# Patient Record
Sex: Female | Born: 1975
Health system: Southern US, Community
[De-identification: ages and names within clinical notes are randomized; demographics above are authoritative.]

## PROBLEM LIST (undated history)

## (undated) DIAGNOSIS — Z9889 Other specified postprocedural states: Secondary | ICD-10-CM

## (undated) DIAGNOSIS — I10 Essential (primary) hypertension: Secondary | ICD-10-CM

## (undated) DIAGNOSIS — J45909 Unspecified asthma, uncomplicated: Secondary | ICD-10-CM

## (undated) DIAGNOSIS — D649 Anemia, unspecified: Secondary | ICD-10-CM

## (undated) DIAGNOSIS — J309 Allergic rhinitis, unspecified: Secondary | ICD-10-CM

## (undated) DIAGNOSIS — Z87442 Personal history of urinary calculi: Secondary | ICD-10-CM

## (undated) DIAGNOSIS — N200 Calculus of kidney: Secondary | ICD-10-CM

## (undated) DIAGNOSIS — K8020 Calculus of gallbladder without cholecystitis without obstruction: Secondary | ICD-10-CM

## (undated) DIAGNOSIS — E785 Hyperlipidemia, unspecified: Secondary | ICD-10-CM

## (undated) DIAGNOSIS — F411 Generalized anxiety disorder: Secondary | ICD-10-CM

## (undated) HISTORY — DX: Personal history of urinary calculi: Z87.442

## (undated) HISTORY — DX: Essential (primary) hypertension: I10

## (undated) HISTORY — DX: Anemia, unspecified: D64.9

## (undated) HISTORY — PX: OTHER SURGICAL HISTORY: SHX169

## (undated) HISTORY — DX: Unspecified asthma, uncomplicated: J45.909

## (undated) HISTORY — DX: Generalized anxiety disorder: F41.1

## (undated) HISTORY — DX: Allergic rhinitis, unspecified: J30.9

## (undated) HISTORY — DX: Hyperlipidemia, unspecified: E78.5

## (undated) HISTORY — DX: Calculus of gallbladder without cholecystitis without obstruction: K80.20

## (undated) HISTORY — PX: KIDNEY STONE SURGERY: SHX686

## (undated) HISTORY — PX: ORIF CONGENITAL HIP DISLOCATION: SHX2117

---

## 1997-07-01 HISTORY — PX: CHOLECYSTECTOMY: SHX55

## 2008-05-31 ENCOUNTER — Ambulatory Visit: Payer: Self-pay | Admitting: Internal Medicine

## 2008-05-31 DIAGNOSIS — F419 Anxiety disorder, unspecified: Secondary | ICD-10-CM | POA: Insufficient documentation

## 2008-05-31 DIAGNOSIS — F411 Generalized anxiety disorder: Secondary | ICD-10-CM

## 2008-05-31 DIAGNOSIS — K8020 Calculus of gallbladder without cholecystitis without obstruction: Secondary | ICD-10-CM | POA: Insufficient documentation

## 2008-05-31 DIAGNOSIS — N200 Calculus of kidney: Secondary | ICD-10-CM | POA: Insufficient documentation

## 2008-05-31 DIAGNOSIS — J309 Allergic rhinitis, unspecified: Secondary | ICD-10-CM

## 2008-05-31 DIAGNOSIS — E785 Hyperlipidemia, unspecified: Secondary | ICD-10-CM

## 2008-05-31 DIAGNOSIS — J45909 Unspecified asthma, uncomplicated: Secondary | ICD-10-CM

## 2008-05-31 DIAGNOSIS — J4520 Mild intermittent asthma, uncomplicated: Secondary | ICD-10-CM | POA: Insufficient documentation

## 2008-05-31 DIAGNOSIS — Z87442 Personal history of urinary calculi: Secondary | ICD-10-CM

## 2008-05-31 HISTORY — DX: Calculus of gallbladder without cholecystitis without obstruction: K80.20

## 2008-05-31 HISTORY — DX: Personal history of urinary calculi: Z87.442

## 2008-05-31 HISTORY — DX: Unspecified asthma, uncomplicated: J45.909

## 2008-05-31 HISTORY — DX: Allergic rhinitis, unspecified: J30.9

## 2008-05-31 HISTORY — DX: Generalized anxiety disorder: F41.1

## 2008-05-31 HISTORY — DX: Hyperlipidemia, unspecified: E78.5

## 2008-11-29 ENCOUNTER — Ambulatory Visit: Payer: Self-pay | Admitting: Internal Medicine

## 2009-08-22 ENCOUNTER — Ambulatory Visit: Payer: Self-pay | Admitting: Internal Medicine

## 2009-11-23 ENCOUNTER — Ambulatory Visit: Payer: Self-pay | Admitting: Internal Medicine

## 2009-11-23 DIAGNOSIS — M25579 Pain in unspecified ankle and joints of unspecified foot: Secondary | ICD-10-CM | POA: Insufficient documentation

## 2009-12-04 ENCOUNTER — Other Ambulatory Visit: Admission: RE | Admit: 2009-12-04 | Discharge: 2009-12-04 | Payer: Self-pay | Admitting: Internal Medicine

## 2009-12-04 ENCOUNTER — Ambulatory Visit: Payer: Self-pay | Admitting: Internal Medicine

## 2009-12-08 LAB — CONVERTED CEMR LAB: Pap Smear: NEGATIVE

## 2010-01-08 ENCOUNTER — Ambulatory Visit: Payer: Self-pay | Admitting: Internal Medicine

## 2010-06-07 ENCOUNTER — Ambulatory Visit: Payer: Self-pay | Admitting: Internal Medicine

## 2010-06-07 DIAGNOSIS — L738 Other specified follicular disorders: Secondary | ICD-10-CM | POA: Insufficient documentation

## 2010-07-31 NOTE — Assessment & Plan Note (Signed)
Summary: 2ND HEP INJ//CCM  Nurse Visit   Allergies: 1)  ! Percocet (Oxycodone-Acetaminophen) 2)  ! Compazine  Immunizations Administered:  Hepatitis A Vaccine # 2:    Vaccine Type: HepA    Site: left deltoid    Mfr: GlaxoSmithKline    Dose: 1.0 ml    Route: IM    Given by: Duard Brady LPN    Exp. Date: 10/18/2011    Lot #: [id number]    VIS given: 09/18/04 version given January 08, 2010.    Physician counseled: yes  Orders Added: 1)  Hepatitis A Vaccine (Adult Dose) [90632] 2)  Admin 1st Vaccine [90471]   Pt is leaving the country for [facility] in 2 weeks. KIK

## 2010-07-31 NOTE — Assessment & Plan Note (Signed)
Summary: pt req hep A inj/dpt inj/cjr  Nurse Visit   Allergies: 1)  ! Percocet (Oxycodone-Acetaminophen) 2)  ! Compazine  Immunizations Administered:  Hepatitis A Vaccine # 1:    Vaccine Type: HepA    Site: right deltoid    Mfr: GlaxoSmithKline    Dose: 1.0 ml    Route: IM    Given by: Duard Brady LPN    Exp. Date: 10/18/2011    Lot #: [id number]  Orders Added: 1)  Hepatitis A Vaccine (Adult Dose) [90632] 2)  Admin 1st Vaccine [09811]  pt aware to make f/u appt for 6mos for 2nd injection. KIK

## 2010-07-31 NOTE — Assessment & Plan Note (Signed)
Summary: cpx/pap/njr   Vital Signs:  Patient profile:   35 year old female Height:      63 inches Weight:      178 pounds BMI:     31.65 Temp:     98.4 degrees F oral BP sitting:   110 / 80  (right arm) Cuff size:   regular  Vitals Entered By: Duard Brady LPN (December 04, 1608 3:00 PM) CC: cpx - needs pap Is Patient Diabetic? No   CC:  cpx - needs pap.  History of Present Illness: patient who is seen today for a health maintenance examination. She has a history of mild asthma which has been stable.  Only complaints are some difficult painful periods.  Discomfort is alleviated by Aleve.  Her asthma has been stable  Preventive Screening-Counseling & Management  Alcohol-Tobacco     Smoking Status: never  Allergies: 1)  ! Percocet (Oxycodone-Acetaminophen) 2)  ! Compazine  Past History:  Past Medical History: Reviewed history from 05/31/2008 and no changes required. Cholelithiasis status post cholecystectomy Nephrolithiasis, hx of history of congenital hip repair, 1979 gravida 3, para 3, abortus zero, status post C-section x 2 Allergic rhinitis Anxiety Asthma Hyperlipidemia  Past Surgical History: Reviewed history from 05/31/2008 and no changes required. Cholecystectomy 2000 surgery for a congenital hip page 6 1/2 months kidney stone extraction  Family History: Reviewed history from 05/31/2008 and no changes required. father age 12, history of elevated triglyceride level mother, age 80, history of breast cancer  Two half-brothers history depression  Social History: Reviewed history from 05/31/2008 and no changes required. Married Never Smoked 3 young children  Review of Systems  The patient denies anorexia, fever, weight loss, weight gain, vision loss, decreased hearing, hoarseness, chest pain, syncope, dyspnea on exertion, peripheral edema, prolonged cough, headaches, hemoptysis, abdominal pain, melena, hematochezia, severe indigestion/heartburn,  hematuria, incontinence, genital sores, muscle weakness, suspicious skin lesions, transient blindness, difficulty walking, depression, unusual weight change, abnormal bleeding, enlarged lymph nodes, angioedema, and breast masses.    Physical Exam  General:  overweight-appearing.  blood pressure, normaloverweight-appearing.   Head:  Normocephalic and atraumatic without obvious abnormalities. No apparent alopecia or balding. Eyes:  No corneal or conjunctival inflammation noted. EOMI. Perrla. Funduscopic exam benign, without hemorrhages, exudates or papilledema. Vision grossly normal. Ears:  External ear exam shows no significant lesions or deformities.  Otoscopic examination reveals clear canals, tympanic membranes are intact bilaterally without bulging, retraction, inflammation or discharge. Hearing is grossly normal bilaterally. Mouth:  Oral mucosa and oropharynx without lesions or exudates.  Teeth in good repair. Neck:  No deformities, masses, or tenderness noted. Chest Wall:  No deformities, masses, or tenderness noted. Breasts:  No mass, nodules, thickening, tenderness, bulging, retraction, inflamation, nipple discharge or skin changes noted.   Lungs:  Normal respiratory effort, chest expands symmetrically. Lungs are clear to auscultation, no crackles or wheezes. Heart:  Normal rate and regular rhythm. S1 and S2 normal without gallop, murmur, click, rub or other extra sounds. Abdomen:  Bowel sounds positive,abdomen soft and non-tender without masses, organomegaly or hernias noted. Rectal:  No external abnormalities noted. Normal sphincter tone. No rectal masses or tenderness. Genitalia:  Normal introitus for age, no external lesions, no vaginal discharge, mucosa pink and moist, no vaginal or cervical lesions, no vaginal atrophy, no friaility or hemorrhage, normal uterus size and position, no adnexal masses or tenderness Msk:  No deformity or scoliosis noted of thoracic or lumbar spine.   Pulses:   R and L carotid,radial,femoral,dorsalis  pedis and posterior tibial pulses are full and equal bilaterally Extremities:  No clubbing, cyanosis, edema, or deformity noted with normal full range of motion of all joints.   Neurologic:  alert & oriented X3, cranial nerves II-XII intact, gait normal, and DTRs symmetrical and normal.  alert & oriented X3, cranial nerves II-XII intact, gait normal, and DTRs symmetrical and normal.   Skin:  Intact without suspicious lesions or rashes Cervical Nodes:  No lymphadenopathy noted Axillary Nodes:  No palpable lymphadenopathy Psych:  Cognition and judgment appear intact. Alert and cooperative with normal attention span and concentration. No apparent delusions, illusions, hallucinations   Impression & Recommendations:  Problem # 1:  Preventive Health Care (ICD-V70.0)  Complete Medication List: 1)  Proventil Hfa 108 (90 Base) Mcg/act Aers (Albuterol sulfate) .... Prn  Other Orders: Prescription Created Electronically [phone])  Patient Instructions: 1)  Limit your Sodium (Salt) to less than 2 grams a day(slightly less than 1/2 a teaspoon) to prevent fluid retention, swelling, or worsening of symptoms. 2)  It is important that you exercise regularly at least 20 minutes 5 times a week. If you develop chest pain, have severe difficulty breathing, or feel very tired , stop exercising immediately and seek medical attention. 3)  You need to lose weight. Consider a lower calorie diet and regular exercise.  Prescriptions: PROVENTIL HFA 108 (90 BASE) MCG/ACT AERS (ALBUTEROL SULFATE) prn  #1 x 6   Entered and Authorized by:   Gordy Savers  MD   Signed by:   Gordy Savers  MD on 12/04/2009   Method used:   Electronically to        [facility]  [address] [phone]* (retail)       [address]       [place], [place]  [address]       Ph: [phone] or [phone]       Fax: [phone]   RxID:   [id number]

## 2010-07-31 NOTE — Assessment & Plan Note (Signed)
Summary: consult re: scratch on leg/?inj/cjr   Vital Signs:  Patient profile:   35 year old female Weight:      183 pounds Temp:     98.3 degrees F oral BP sitting:   130 / 78  (right arm) Cuff size:   regular  Vitals Entered By: Duard Brady LPN (June 07, 2010 12:45 PM) CC: (R) inner thigh ??infection Is Patient Diabetic? No   CC:  (R) inner thigh ??infection.  History of Present Illness: 35 year old patient who was seen at an urgent care 5 days ago and diagnosed with a strep pharyngitis.  She has been treated with Pen-Vee K.  She had an area of folliculitis involving the right upper inner thigh and was concerned about a significant skin and soft tissue infection..  She does state that the infection has improved over the past two days.  There's been no fever or chills.  He has a history of asthma, which has been stable  Allergies: 1)  ! Percocet (Oxycodone-Acetaminophen) 2)  ! Compazine  Review of Systems       The patient complains of suspicious skin lesions.  The patient denies anorexia, fever, weight loss, weight gain, vision loss, decreased hearing, hoarseness, chest pain, syncope, dyspnea on exertion, peripheral edema, prolonged cough, headaches, hemoptysis, abdominal pain, melena, hematochezia, severe indigestion/heartburn, hematuria, incontinence, genital sores, muscle weakness, transient blindness, difficulty walking, depression, unusual weight change, abnormal bleeding, enlarged lymph nodes, angioedema, and breast masses.    Physical Exam  General:  overweight-appearing.  distress afebrileoverweight-appearing.   Skin:  a small scaly, inflammatory nodule, approximately 4 to 5 mm present involving the right mid thigh region.  There is a slight surrounding area of induration, but no significant pain or induration.  This appeared to be improving   Impression & Recommendations:  Problem # 1:  FOLLICULITIS (ICD-704.8)  Problem # 2:  ASTHMA (ICD-493.90)  Her  updated medication list for this problem includes:    Proventil Hfa 108 (90 Base) Mcg/act Aers (Albuterol sulfate) .Marland Kitchen... Prn  Her updated medication list for this problem includes:    Proventil Hfa 108 (90 Base) Mcg/act Aers (Albuterol sulfate) .Marland Kitchen... Prn  Complete Medication List: 1)  Proventil Hfa 108 (90 Base) Mcg/act Aers (Albuterol sulfate) .... Prn 2)  Cephalexin 500 Mg Caps (Cephalexin) .... One three times a day  Patient Instructions: 1)  Please schedule a follow-up appointment as needed. Prescriptions: CEPHALEXIN 500 MG CAPS (CEPHALEXIN) one three times a day  #30and x 0   Entered and Authorized by:   Gordy Savers  MD   Signed by:   Gordy Savers  MD on 06/07/2010   Method used:   Print then Give to Patient   RxID:   [id number]    Orders Added: 1)  Est. Patient Level III [14782]

## 2010-07-31 NOTE — Assessment & Plan Note (Signed)
Summary: ankle discomfort/njr   Vital Signs:  Patient profile:   35 year old female Weight:      177 pounds Temp:     98.4 degrees F oral BP sitting:   126 / 70  (right arm)  Vitals Entered By: Duard Brady LPN (Nov 23, 2009 11:30 AM) CC: c/o (R) leg and ankle pain - twisted on sunday Is Patient Diabetic? No   CC:  c/o (R) leg and ankle pain - twisted on sunday.  History of Present Illness:  35 year old patient who fell down stairs at home 4 days ago.  She sustained trauma at that time, primarily to her right shoulder and right lower leg.  The shoulder pain  has largely resolved which is concerned about persistent swelling about her right ankle.  There is  very little right ankle discomfort.  She did sustain some soft tissue trauma to her right lower leg, above the ankle. patient has a history of asthma, which has been stable  Preventive Screening-Counseling & Management  Alcohol-Tobacco     Smoking Status: never  Allergies: 1)  ! Percocet (Oxycodone-Acetaminophen) 2)  ! Compazine  Physical Exam  General:  Well-developed,well-nourished,in no acute distress; alert,appropriate and cooperative throughout examination Msk:  resolving contusion over the right shoulder and right lower leg.  There was a mild edema about the right ankle with very little discomfort and no ecchymoses   Impression & Recommendations:  Problem # 1:  ASTHMA (ICD-493.90)  Problem # 2:  ANKLE PAIN, RIGHT (ICD-719.47)  Patient Instructions: 1)  attempt to keep your  right leg elevated as much as possible 2)  Take 400-600mg  of Ibuprofen (Advil, Motrin) with food every 4-6 hours as needed for relief of pain or comfort of fever.

## 2010-09-04 ENCOUNTER — Encounter: Payer: Self-pay | Admitting: Internal Medicine

## 2010-09-05 ENCOUNTER — Ambulatory Visit (INDEPENDENT_AMBULATORY_CARE_PROVIDER_SITE_OTHER): Payer: BC Managed Care – PPO | Admitting: Internal Medicine

## 2010-09-05 ENCOUNTER — Encounter: Payer: Self-pay | Admitting: Internal Medicine

## 2010-09-05 VITALS — BP 130/90 | Temp 98.0°F | Wt 180.0 lb

## 2010-09-05 DIAGNOSIS — G4700 Insomnia, unspecified: Secondary | ICD-10-CM

## 2010-09-05 DIAGNOSIS — R109 Unspecified abdominal pain: Secondary | ICD-10-CM

## 2010-09-05 LAB — POCT URINALYSIS DIPSTICK
Bilirubin, UA: NEGATIVE
Blood, UA: NEGATIVE
Glucose, UA: NEGATIVE
Ketones, UA: NEGATIVE
Leukocytes, UA: NEGATIVE
Nitrite, UA: NEGATIVE
pH, UA: 7.5

## 2010-09-05 MED ORDER — ZOLPIDEM TARTRATE 10 MG PO TABS: 10.0000 mg | ORAL_TABLET | Freq: Every evening | ORAL | Status: AC | PRN

## 2010-09-05 NOTE — Progress Notes (Signed)
  Subjective:    Patient ID: Angela Harrell, female    DOB: 12-19-1975, 35 y.o.   MRN: [id number]  HPI  35 year old patient who presents with a chief complaint of insomnia. She has had significant situational stressors and has 3 children at home one of which is still preschool. She has had some insomnia issues as well as episodic diarrhea. She feels that she might benefit talking with a Veterinary surgeon.   Review of Systems  Constitutional: Negative.   HENT: Negative for hearing loss, congestion, sore throat, rhinorrhea, dental problem, sinus pressure and tinnitus.   Eyes: Negative for pain, discharge and visual disturbance.  Respiratory: Negative for cough and shortness of breath.   Cardiovascular: Negative for chest pain, palpitations and leg swelling.  Gastrointestinal: Negative for nausea, vomiting, abdominal pain, diarrhea, constipation, blood in stool and abdominal distention.  Genitourinary: Negative for dysuria, urgency, frequency, hematuria, flank pain, vaginal bleeding, vaginal discharge, difficulty urinating, vaginal pain and pelvic pain.  Musculoskeletal: Negative for joint swelling, arthralgias and gait problem.  Skin: Negative for rash.  Neurological: Negative for dizziness, syncope, speech difficulty, weakness, numbness and headaches.  Hematological: Negative for adenopathy.  Psychiatric/Behavioral: Positive for sleep disturbance and dysphoric mood. Negative for behavioral problems and agitation. The patient is not nervous/anxious.        Objective:   Physical Exam  Constitutional: She is oriented to person, place, and time. She appears well-developed and well-nourished.  HENT:  Head: Normocephalic.  Right Ear: External ear normal.  Left Ear: External ear normal.  Mouth/Throat: Oropharynx is clear and moist.  Eyes: Conjunctivae and EOM are normal. Pupils are equal, round, and reactive to light.  Neck: Normal range of motion. Neck supple. No thyromegaly present.  Cardiovascular:  Normal rate, regular rhythm, normal heart sounds and intact distal pulses.   Pulmonary/Chest: Effort normal and breath sounds normal.  Abdominal: Soft. Bowel sounds are normal. She exhibits no mass. There is no tenderness.  Musculoskeletal: Normal range of motion.  Lymphadenopathy:    She has no cervical adenopathy.  Neurological: She is alert and oriented to person, place, and time.  Skin: Skin is warm and dry. No rash noted.  Psychiatric: She has a normal mood and affect. Her behavior is normal.        Anxious and at times tearful          Assessment & Plan:   situational stress anxiety insomnia   We'll dispense a prescription for Ambien to take when necessary. We'll set her up for behavioral health

## 2010-09-05 NOTE — Patient Instructions (Signed)
It is important that you exercise regularly, at least 20 minutes 3 to 4 times per week.  If you develop chest pain or shortness of breath seek  medical attention.   behavioral health referral as discussed  Call or return to clinic prn if these symptoms worsen or fail to improve as anticipated.

## 2010-10-17 ENCOUNTER — Ambulatory Visit (INDEPENDENT_AMBULATORY_CARE_PROVIDER_SITE_OTHER): Payer: BC Managed Care – PPO | Admitting: Licensed Clinical Social Worker

## 2010-10-17 DIAGNOSIS — F4322 Adjustment disorder with anxiety: Secondary | ICD-10-CM

## 2010-10-31 ENCOUNTER — Ambulatory Visit (INDEPENDENT_AMBULATORY_CARE_PROVIDER_SITE_OTHER): Payer: BC Managed Care – PPO | Admitting: Licensed Clinical Social Worker

## 2010-10-31 DIAGNOSIS — F4322 Adjustment disorder with anxiety: Secondary | ICD-10-CM

## 2010-11-19 ENCOUNTER — Ambulatory Visit (INDEPENDENT_AMBULATORY_CARE_PROVIDER_SITE_OTHER): Payer: BC Managed Care – PPO | Admitting: Licensed Clinical Social Worker

## 2010-11-19 DIAGNOSIS — F4322 Adjustment disorder with anxiety: Secondary | ICD-10-CM

## 2010-11-21 ENCOUNTER — Ambulatory Visit (INDEPENDENT_AMBULATORY_CARE_PROVIDER_SITE_OTHER): Payer: BC Managed Care – PPO | Admitting: Licensed Clinical Social Worker

## 2010-11-21 DIAGNOSIS — F4322 Adjustment disorder with anxiety: Secondary | ICD-10-CM

## 2010-12-07 ENCOUNTER — Telehealth: Payer: Self-pay | Admitting: *Deleted

## 2010-12-07 NOTE — Telephone Encounter (Signed)
VM pt reporting fever X 3 days, 100.0, 101.0, 102.0, started as a headache, body aches.  Cough began last night, denies abd pain, no nausea, vomiting, diarrhea.  Fever gets worse after she eats something...temp is 101 now.  Offered OV with Dr Rodena Medin, pt wanted Dr Charm Rings suggestion first.  Also gave phone number for Sat Clinic.

## 2010-12-07 NOTE — Telephone Encounter (Signed)
Pt informed

## 2010-12-07 NOTE — Telephone Encounter (Signed)
Suggest symptomatic treatment with Tylenol and use next DM. Okay for Saturday clinic in the morning if unimproved

## 2010-12-14 ENCOUNTER — Encounter: Payer: Self-pay | Admitting: Internal Medicine

## 2010-12-14 ENCOUNTER — Ambulatory Visit (INDEPENDENT_AMBULATORY_CARE_PROVIDER_SITE_OTHER): Payer: BC Managed Care – PPO | Admitting: Internal Medicine

## 2010-12-14 DIAGNOSIS — J45909 Unspecified asthma, uncomplicated: Secondary | ICD-10-CM

## 2010-12-14 DIAGNOSIS — D239 Other benign neoplasm of skin, unspecified: Secondary | ICD-10-CM

## 2010-12-14 DIAGNOSIS — D229 Melanocytic nevi, unspecified: Secondary | ICD-10-CM

## 2010-12-14 DIAGNOSIS — J069 Acute upper respiratory infection, unspecified: Secondary | ICD-10-CM

## 2010-12-14 NOTE — Progress Notes (Signed)
  Subjective:    Patient ID: Angela Harrell, female    DOB: 17-Jan-1976, 35 y.o.   MRN: [id number]  HPI 35 year old patient who has a history of asthma who presents with a one-week history of largely nonproductive cough. There's been no increase in wheezing. She had fever earlier that has resolved no chest pain or shortness of breath. Associated complaints include fatigue. 2 children at home have a similar illness   Review of Systems  Constitutional: Positive for fatigue.  HENT: Negative for hearing loss, congestion, sore throat, rhinorrhea, dental problem, sinus pressure and tinnitus.   Eyes: Negative for pain, discharge and visual disturbance.  Respiratory: Positive for cough. Negative for shortness of breath.   Cardiovascular: Negative for chest pain, palpitations and leg swelling.  Gastrointestinal: Negative for nausea, vomiting, abdominal pain, diarrhea, constipation, blood in stool and abdominal distention.  Genitourinary: Negative for dysuria, urgency, frequency, hematuria, flank pain, vaginal bleeding, vaginal discharge, difficulty urinating, vaginal pain and pelvic pain.  Musculoskeletal: Negative for joint swelling, arthralgias and gait problem.  Skin: Negative for rash.  Neurological: Negative for dizziness, syncope, speech difficulty, weakness, numbness and headaches.  Hematological: Negative for adenopathy.  Psychiatric/Behavioral: Negative for behavioral problems, dysphoric mood and agitation. The patient is not nervous/anxious.        Objective:   Physical Exam  Constitutional: She is oriented to person, place, and time. She appears well-developed and well-nourished.  HENT:  Head: Normocephalic.  Right Ear: External ear normal.  Left Ear: External ear normal.  Mouth/Throat: Oropharynx is clear and moist.  Eyes: Conjunctivae and EOM are normal. Pupils are equal, round, and reactive to light.  Neck: Normal range of motion. Neck supple. No thyromegaly present.  Cardiovascular:  Normal rate, regular rhythm, normal heart sounds and intact distal pulses.   Pulmonary/Chest: Effort normal and breath sounds normal.  Abdominal: Soft. Bowel sounds are normal. She exhibits no mass. There is no tenderness.  Musculoskeletal: Normal range of motion.  Lymphadenopathy:    She has no cervical adenopathy.  Neurological: She is alert and oriented to person, place, and time.  Skin: Skin is warm and dry. No rash noted.  Psychiatric: She has a normal mood and affect. Her behavior is normal.          Assessment & Plan:   Viral URI. Will treat symptomatically with Mucinex DM Asthma stable will continue Proventil when necessary

## 2010-12-14 NOTE — Patient Instructions (Signed)
Get plenty of rest, Drink lots of  clear liquids, and use Tylenol or ibuprofen for fever and discomfort.    Call or return to clinic prn if these symptoms worsen or fail to improve as anticipated.  

## 2010-12-20 ENCOUNTER — Ambulatory Visit (INDEPENDENT_AMBULATORY_CARE_PROVIDER_SITE_OTHER): Payer: BC Managed Care – PPO | Admitting: Licensed Clinical Social Worker

## 2010-12-20 DIAGNOSIS — F4322 Adjustment disorder with anxiety: Secondary | ICD-10-CM

## 2011-02-08 ENCOUNTER — Encounter: Payer: Self-pay | Admitting: Family Medicine

## 2011-02-08 ENCOUNTER — Ambulatory Visit (INDEPENDENT_AMBULATORY_CARE_PROVIDER_SITE_OTHER): Payer: BC Managed Care – PPO | Admitting: Family Medicine

## 2011-02-08 VITALS — BP 110/80 | Temp 98.2°F | Wt 172.0 lb

## 2011-02-08 DIAGNOSIS — L03119 Cellulitis of unspecified part of limb: Secondary | ICD-10-CM

## 2011-02-08 DIAGNOSIS — L02419 Cutaneous abscess of limb, unspecified: Secondary | ICD-10-CM

## 2011-02-08 MED ORDER — DOXYCYCLINE HYCLATE 100 MG PO TABS: 100.0000 mg | ORAL_TABLET | Freq: Two times a day (BID) | ORAL | Status: AC

## 2011-02-08 NOTE — Progress Notes (Signed)
  Subjective:    Patient ID: Angela Harrell, female    DOB: 24-Jun-1976, 35 y.o.   MRN: [id number]  HPI Rash left upper thigh. Onset yesterday. Minimally painful. No fever or chills. No history of MRSA or recurrent staph. Not aware of any bites. She denies any antibiotic allergies. No significant chronic medical problems. No exacerbating or alleviating factors.   Review of Systems  Constitutional: Negative for fever and chills.  Skin: Positive for rash.  Hematological: Negative for adenopathy.       Objective:   Physical Exam  Constitutional: She is oriented to person, place, and time. She appears well-developed and well-nourished.  Cardiovascular: Normal rate and regular rhythm.   Neurological: She is alert and oriented to person, place, and time.  Skin:       Oval shaped area of erythema left upper thigh anteriorly. Area is 1 x 2 cm. Central slightly crusted punctate area. No fluctuance. Minimally tender          Assessment & Plan:  Cellulitis left upper anterior thigh.  No abscess at this time.  Doxycycline 100 mg twice daily for 10 days. Follow up promptly for fever or worsening rash

## 2011-02-08 NOTE — Patient Instructions (Signed)
Continue warm compresses to L thigh several times daily Follow up promptly for any fever or worsening rash.

## 2012-02-19 ENCOUNTER — Other Ambulatory Visit: Payer: Self-pay

## 2012-10-02 ENCOUNTER — Encounter (HOSPITAL_COMMUNITY): Payer: Self-pay | Admitting: Emergency Medicine

## 2012-10-02 DIAGNOSIS — Z8709 Personal history of other diseases of the respiratory system: Secondary | ICD-10-CM | POA: Insufficient documentation

## 2012-10-02 DIAGNOSIS — Z79899 Other long term (current) drug therapy: Secondary | ICD-10-CM | POA: Insufficient documentation

## 2012-10-02 DIAGNOSIS — R319 Hematuria, unspecified: Secondary | ICD-10-CM | POA: Insufficient documentation

## 2012-10-02 DIAGNOSIS — Z862 Personal history of diseases of the blood and blood-forming organs and certain disorders involving the immune mechanism: Secondary | ICD-10-CM | POA: Insufficient documentation

## 2012-10-02 DIAGNOSIS — R300 Dysuria: Secondary | ICD-10-CM | POA: Insufficient documentation

## 2012-10-02 DIAGNOSIS — Z8719 Personal history of other diseases of the digestive system: Secondary | ICD-10-CM | POA: Insufficient documentation

## 2012-10-02 DIAGNOSIS — Z8659 Personal history of other mental and behavioral disorders: Secondary | ICD-10-CM | POA: Insufficient documentation

## 2012-10-02 DIAGNOSIS — R350 Frequency of micturition: Secondary | ICD-10-CM | POA: Insufficient documentation

## 2012-10-02 DIAGNOSIS — J45909 Unspecified asthma, uncomplicated: Secondary | ICD-10-CM | POA: Insufficient documentation

## 2012-10-02 DIAGNOSIS — N200 Calculus of kidney: Secondary | ICD-10-CM | POA: Insufficient documentation

## 2012-10-02 DIAGNOSIS — Z3202 Encounter for pregnancy test, result negative: Secondary | ICD-10-CM | POA: Insufficient documentation

## 2012-10-02 DIAGNOSIS — Z8639 Personal history of other endocrine, nutritional and metabolic disease: Secondary | ICD-10-CM | POA: Insufficient documentation

## 2012-10-02 NOTE — ED Notes (Signed)
C/o L sided abd pain and L flank pain since 1pm with mild nausea. Reports difficulty urinating today.

## 2012-10-03 ENCOUNTER — Emergency Department (HOSPITAL_COMMUNITY): Payer: BC Managed Care – PPO

## 2012-10-03 ENCOUNTER — Emergency Department (HOSPITAL_COMMUNITY)
Admission: EM | Admit: 2012-10-03 | Discharge: 2012-10-03 | Disposition: A | Discharge status: Home / Self Care | Payer: BC Managed Care – PPO | Attending: Emergency Medicine | Admitting: Emergency Medicine

## 2012-10-03 DIAGNOSIS — N200 Calculus of kidney: Secondary | ICD-10-CM

## 2012-10-03 HISTORY — DX: Calculus of kidney: N20.0

## 2012-10-03 LAB — CBC WITH DIFFERENTIAL/PLATELET
Basophils Relative: 0 % (ref 0–1)
Eosinophils Absolute: 0.6 10*3/uL (ref 0.0–0.7)
Eosinophils Relative: 6 % — ABNORMAL HIGH (ref 0–5)
HCT: 37 % (ref 36.0–46.0)
Hemoglobin: 12.8 g/dL (ref 12.0–15.0)
MCH: 29 pg (ref 26.0–34.0)
MCHC: 34.6 g/dL (ref 30.0–36.0)
MCV: 83.7 fL (ref 78.0–100.0)
Monocytes Absolute: 0.8 10*3/uL (ref 0.1–1.0)
Monocytes Relative: 7 % (ref 3–12)

## 2012-10-03 LAB — URINALYSIS, MICROSCOPIC ONLY
Glucose, UA: NEGATIVE mg/dL
Leukocytes, UA: NEGATIVE
Protein, ur: 30 mg/dL — AB
Urobilinogen, UA: 0.2 mg/dL (ref 0.0–1.0)

## 2012-10-03 LAB — COMPREHENSIVE METABOLIC PANEL
Albumin: 4 g/dL (ref 3.5–5.2)
BUN: 13 mg/dL (ref 6–23)
Chloride: 102 mEq/L (ref 96–112)
Creatinine, Ser: 0.85 mg/dL (ref 0.50–1.10)
Total Bilirubin: 0.2 mg/dL — ABNORMAL LOW (ref 0.3–1.2)

## 2012-10-03 MED ORDER — ONDANSETRON HCL 4 MG PO TABS: 4.0000 mg | ORAL_TABLET | Freq: Four times a day (QID) | ORAL | Status: DC

## 2012-10-03 MED ORDER — ONDANSETRON HCL 4 MG/2ML IJ SOLN
4.0000 mg | Freq: Once | INTRAMUSCULAR | Status: AC
  Administered 2012-10-03: 4 mg via INTRAVENOUS
  Filled 2012-10-03: qty 2

## 2012-10-03 MED ORDER — HYDROCODONE-ACETAMINOPHEN 5-325 MG PO TABS: 1.0000 | ORAL_TABLET | Freq: Four times a day (QID) | ORAL | Status: DC | PRN

## 2012-10-03 NOTE — ED Notes (Signed)
Dr. Norlene Campbell notified of pt.  No additional orders at this time.

## 2012-10-03 NOTE — ED Notes (Signed)
Patient transported to CT 

## 2012-10-03 NOTE — ED Provider Notes (Signed)
History     CSN: [id number]  Arrival date & time 10/02/12  2323   First MD Initiated Contact with Patient 10/03/12 [phone]      Chief Complaint  Patient presents with  . Flank Pain    (Consider location/radiation/quality/duration/timing/severity/associated sxs/prior treatment) Patient is a 37 y.o. female presenting with flank pain. The history is provided by the patient.  Flank Pain This is a new problem. The current episode started 3 to 5 hours ago. Episode frequency: intermittent today and severe about 1 hour before arrival. The problem has been gradually improving. Associated symptoms include abdominal pain. Associated symptoms comments: Left flank pain and suprapubic pain with dark urine and mild burning with urination.  Nausea without vomiting. Exacerbated by: urinating. Nothing relieves the symptoms. Treatments tried: nsaid. The treatment provided no relief.    Past Medical History  Diagnosis Date  . ALLERGIC RHINITIS 05/31/2008  . ANXIETY 05/31/2008  . ASTHMA 05/31/2008  . CHOLELITHIASIS 05/31/2008  . HYPERLIPIDEMIA 05/31/2008  . NEPHROLITHIASIS, HX OF 05/31/2008  . Kidney stone     Past Surgical History  Procedure Laterality Date  . Cholecystectomy    . Kidney stone surgery      No family history on file.  History  Substance Use Topics  . Smoking status: Never Smoker   . Smokeless tobacco: Not on file  . Alcohol Use: No    OB History   Grav Para Term Preterm Abortions TAB SAB Ect Mult Living                  Review of Systems  Constitutional: Negative for fever.  Gastrointestinal: Positive for abdominal pain.  Genitourinary: Positive for dysuria, frequency, hematuria and flank pain.  All other systems reviewed and are negative.    Allergies  Oxycodone-acetaminophen and Prochlorperazine edisylate  Home Medications   Current Outpatient Rx  Name  Route  Sig  Dispense  Refill  . albuterol (PROVENTIL HFA) 108 (90 BASE) MCG/ACT inhaler   Inhalation  Inhale 2 puffs into the lungs 3 (three) times daily.           . cetirizine (ZYRTEC) 10 MG tablet   Oral   Take 10 mg by mouth daily.             BP 130/82  Pulse 95  Temp(Src) 97.8 F (36.6 C)  Resp 16  SpO2 100%  LMP 09/09/2012  Physical Exam  Nursing note and vitals reviewed. Constitutional: She is oriented to person, place, and time. She appears well-developed and well-nourished. No distress.  HENT:  Head: Normocephalic and atraumatic.  Mouth/Throat: Oropharynx is clear and moist.  Eyes: Conjunctivae and EOM are normal. Pupils are equal, round, and reactive to light.  Neck: Normal range of motion. Neck supple.  Cardiovascular: Normal rate, regular rhythm and intact distal pulses.   No murmur heard. Pulmonary/Chest: Effort normal and breath sounds normal. No respiratory distress. She has no wheezes. She has no rales.  Abdominal: Soft. Normal appearance. She exhibits no distension. There is tenderness in the suprapubic area. There is no rebound, no guarding and no CVA tenderness.  Musculoskeletal: Normal range of motion. She exhibits no edema and no tenderness.  Neurological: She is alert and oriented to person, place, and time.  Skin: Skin is warm and dry. No rash noted. No erythema.  Psychiatric: She has a normal mood and affect. Her behavior is normal.    ED Course  Procedures (including critical care time)  Labs Reviewed  CBC WITH DIFFERENTIAL -  Abnormal; Notable for the following:    Eosinophils Relative 6 (*)    All other components within normal limits  COMPREHENSIVE METABOLIC PANEL - Abnormal; Notable for the following:    Glucose, Bld 176 (*)    Total Bilirubin 0.2 (*)    GFR calc non Af Amer 88 (*)    All other components within normal limits  URINALYSIS, MICROSCOPIC ONLY - Abnormal; Notable for the following:    APPearance CLOUDY (*)    Hgb urine dipstick LARGE (*)    Bilirubin Urine SMALL (*)    Ketones, ur 15 (*)    Protein, ur 30 (*)     Bacteria, UA FEW (*)    Squamous Epithelial / LPF FEW (*)    Crystals CA OXALATE CRYSTALS (*)    All other components within normal limits  POCT PREGNANCY, URINE   Ct Abdomen Pelvis Wo Contrast  10/03/2012  *RADIOLOGY REPORT*  Clinical Data: Left lower quadrant pain.  Previous renal stones.  CT ABDOMEN AND PELVIS WITHOUT CONTRAST  Technique:  Multidetector CT imaging of the abdomen and pelvis was performed following the standard protocol without intravenous contrast.  Comparison: None.  Findings: Lung bases are clear.  There is a 4 mm stone in the lower pole of the right kidney and additional punctate stone in the upper pole of the right kidney. No pyelocaliectasis or ureterectasis on the right.  There is no significant pyelocaliectasis or ureterectasis on the left.  No ureteral stones are demonstrated on the left.  However, the very distal portion of the left ureter is obscured to visualization. Bladder wall is not thickened and no bladder stones are visualized. Calcified phleboliths in the pelvis.  Surgical absence of the gallbladder.  The unenhanced appearance of the liver, spleen, pancreas, adrenal glands, abdominal aorta, and retroperitoneal lymph nodes is unremarkable.  The stomach and small bowel are decompressed.  Stool within the colon.  No colonic distension.  Broad-based hernia versus laxity of the anterior abdominal wall at the midline.  No bowel herniation.  Pelvis:  Uterus and ovaries are not enlarged.  No free or loculated pelvic fluid collections.  The appendix is normal. Normal alignment of the lumbar vertebrae.  IMPRESSION: No pyelocaliectasis, ureterectasis, or ureteral stones identified. Nonobstructing intrarenal stones on the right.   Original Report Authenticated By: Burman Nieves, M.D.      1. Kidney stone on left side       MDM   Pt with symptoms consistent with kidney stone.  Denies infectious sx, or GI symptoms.  Low concern for diverticulitis and no risk factors or  history suggestive of AAA.  No hx suggestive of GU source (discharge) and otherwise pt is healthy.  Prior hx of stone years ago requiring stent due to not passing.  Will hydrate, treat nausea and ensure no infection with UA, CBC, CMP and will get stone study to further eval.  2:54 AM UA with evidence of blood but no infection.  Cr stable.  CT pending.  4:40 AM CT scan is negative for obstructing stone. The stone is less likely past the patient was discharged with urology follow up when necessary       Gwyneth Sprout, MD 10/03/12 [phone]

## 2014-01-19 ENCOUNTER — Emergency Department (HOSPITAL_BASED_OUTPATIENT_CLINIC_OR_DEPARTMENT_OTHER)
Admission: EM | Admit: 2014-01-19 | Discharge: 2014-01-19 | Disposition: A | Discharge status: Home / Self Care | Payer: BC Managed Care – PPO | Attending: Emergency Medicine | Admitting: Emergency Medicine

## 2014-01-19 ENCOUNTER — Encounter (HOSPITAL_BASED_OUTPATIENT_CLINIC_OR_DEPARTMENT_OTHER): Payer: Self-pay | Admitting: Emergency Medicine

## 2014-01-19 DIAGNOSIS — IMO0002 Reserved for concepts with insufficient information to code with codable children: Secondary | ICD-10-CM | POA: Insufficient documentation

## 2014-01-19 DIAGNOSIS — S61209A Unspecified open wound of unspecified finger without damage to nail, initial encounter: Secondary | ICD-10-CM | POA: Insufficient documentation

## 2014-01-19 DIAGNOSIS — Z87442 Personal history of urinary calculi: Secondary | ICD-10-CM | POA: Insufficient documentation

## 2014-01-19 DIAGNOSIS — Z8719 Personal history of other diseases of the digestive system: Secondary | ICD-10-CM | POA: Insufficient documentation

## 2014-01-19 DIAGNOSIS — Y9389 Activity, other specified: Secondary | ICD-10-CM | POA: Insufficient documentation

## 2014-01-19 DIAGNOSIS — S6992XA Unspecified injury of left wrist, hand and finger(s), initial encounter: Secondary | ICD-10-CM

## 2014-01-19 DIAGNOSIS — W460XXA Contact with hypodermic needle, initial encounter: Secondary | ICD-10-CM | POA: Insufficient documentation

## 2014-01-19 DIAGNOSIS — Z8639 Personal history of other endocrine, nutritional and metabolic disease: Secondary | ICD-10-CM | POA: Insufficient documentation

## 2014-01-19 DIAGNOSIS — Z79899 Other long term (current) drug therapy: Secondary | ICD-10-CM | POA: Insufficient documentation

## 2014-01-19 DIAGNOSIS — J45909 Unspecified asthma, uncomplicated: Secondary | ICD-10-CM | POA: Insufficient documentation

## 2014-01-19 DIAGNOSIS — Y929 Unspecified place or not applicable: Secondary | ICD-10-CM | POA: Insufficient documentation

## 2014-01-19 DIAGNOSIS — Z91040 Latex allergy status: Secondary | ICD-10-CM | POA: Insufficient documentation

## 2014-01-19 DIAGNOSIS — Z862 Personal history of diseases of the blood and blood-forming organs and certain disorders involving the immune mechanism: Secondary | ICD-10-CM | POA: Insufficient documentation

## 2014-01-19 DIAGNOSIS — Z8659 Personal history of other mental and behavioral disorders: Secondary | ICD-10-CM | POA: Insufficient documentation

## 2014-01-19 MED ORDER — NITROGLYCERIN 2 % TD OINT
TOPICAL_OINTMENT | TRANSDERMAL | Status: AC
  Administered 2014-01-19: 1 [in_us] via TOPICAL
  Filled 2014-01-19: qty 1

## 2014-01-19 MED ORDER — NITROGLYCERIN 2 % TD OINT
TOPICAL_OINTMENT | TRANSDERMAL | Status: AC
  Filled 2014-01-19: qty 1

## 2014-01-19 MED ORDER — NITROGLYCERIN 2 % TD OINT
1.0000 [in_us] | TOPICAL_OINTMENT | Freq: Once | TRANSDERMAL | Status: AC
Start: 2014-01-19 — End: 2014-01-19

## 2014-01-19 MED ORDER — NITROGLYCERIN 2 % TD OINT
1.0000 [in_us] | TOPICAL_OINTMENT | Freq: Once | TRANSDERMAL | Status: AC
  Administered 2014-01-19: 1 [in_us] via TOPICAL

## 2014-01-19 NOTE — Discharge Instructions (Signed)
Fingertip Injuries and Amputations °Fingertip injuries are common and often get injured because they are last to escape when pulling your hand out of harm's way. You have amputated (cut off) part of your finger. How this turns out depends largely on how much was amputated. If just the tip is amputated, often the end of the finger will grow back and the finger may return to much the same as it was before the injury.  °If more of the finger is missing, your caregiver has done the best with the tissue remaining to allow you to keep as much finger as is possible. Your caregiver after checking your injury has tried to leave you with a painless fingertip that has durable, feeling skin. If possible, your caregiver has tried to maintain the finger's length and appearance and preserve its fingernail.  °Please read the instructions outlined below and refer to this sheet in the next few weeks. These instructions provide you with general information on caring for yourself. Your caregiver may also give you specific instructions. While your treatment has been done according to the most current medical practices available, unavoidable complications occasionally occur. If you have any problems or questions after discharge, please call your caregiver. °HOME CARE INSTRUCTIONS  °· You may resume normal diet and activities as directed or allowed. °· Keep your hand elevated above the level of your heart. This helps decrease pain and swelling. °· Keep ice packs (or a bag of ice wrapped in a towel) on the injured area for 15-20 minutes, 03-04 times per day, for the first two days. °· Change dressings if necessary or as directed. °· Clean the wound daily or as directed. °· Only take over-the-counter or prescription medicines for pain, discomfort, or fever as directed by your caregiver. °· Keep appointments as directed. °SEEK IMMEDIATE MEDICAL CARE IF: °· You develop redness, swelling, numbness or increasing pain in the wound. °· There is  pus coming from the wound. °· You develop an unexplained oral temperature above 102° F (38.9° C) or as your caregiver suggests. °· There is a foul (bad) smell coming from the wound or dressing. °· There is a breaking open of the wound (edges not staying together) after sutures or staples have been removed. °MAKE SURE YOU:  °· Understand these instructions. °· Will watch your condition. °· Will get help right away if you are not doing well or get worse. °Document Released: 05/08/2005 Document Revised: 09/09/2011 Document Reviewed: 04/06/2008 °[employer]® Patient Information ©2015 [employer]. This information is not intended to replace advice given to you by your health care provider. Make sure you discuss any questions you have with your health care provider. ° °

## 2014-01-19 NOTE — ED Provider Notes (Signed)
CSN: [id number]     Arrival date & time 01/19/14  1928 History  This chart was scribed for Osvaldo Shipper, MD, by Neta Ehlers, ED Scribe. This patient was seen in room MHT13/[facility]13 and the patient's care was started at 7:42 PM.   First MD Initiated Contact with Patient 01/19/14 1938     Chief Complaint  Patient presents with  . epi pen injection to finger     Patient is a 38 y.o. female presenting with hand pain. The history is provided by the patient. No language interpreter was used.  Hand Pain This is a new problem. The current episode started 1 to 2 hours ago. The problem has not changed since onset.Pertinent negatives include no abdominal pain and no shortness of breath. Nothing aggravates the symptoms. Nothing relieves the symptoms. She has tried nothing for the symptoms.   HPI Comments: Angela Harrell is a 38 y.o. female who presents to the Emergency Department complaining of an accidental injection of an epi-pen to her left index finger which occurred an hour ago. She reports she was attempting to inject the medication into her friend this evening but was unsure how to utilize the epi-pen and accidentally injected the medication into her finger. The pt reports hyper-awareness currently as well as coldness of the affected finger.  She also reports baseline loss of sensation to her left index finger due to a h/o gallbladder issues.   Past Medical History  Diagnosis Date  . ALLERGIC RHINITIS 05/31/2008  . ANXIETY 05/31/2008  . ASTHMA 05/31/2008  . CHOLELITHIASIS 05/31/2008  . HYPERLIPIDEMIA 05/31/2008  . NEPHROLITHIASIS, HX OF 05/31/2008  . Kidney stone    Past Surgical History  Procedure Laterality Date  . Cholecystectomy    . Kidney stone surgery     History reviewed. No pertinent family history. History  Substance Use Topics  . Smoking status: Never Smoker   . Smokeless tobacco: Not on file  . Alcohol Use: No   No OB history provided.  Review of Systems  Respiratory:  Negative for shortness of breath.   Gastrointestinal: Negative for abdominal pain.  Skin: Positive for wound.  All other systems reviewed and are negative.   Allergies  Prochlorperazine edisylate; Banana; Latex; Morphine and related; and Oxycodone-acetaminophen  Home Medications   Prior to Admission medications   Medication Sig Start Date End Date Taking? Authorizing Provider  albuterol (PROVENTIL HFA) 108 (90 BASE) MCG/ACT inhaler Inhale 2 puffs into the lungs every 6 (six) hours as needed for wheezing or shortness of breath.     Historical Provider, MD  B Complex Vitamins (VITAMIN B COMPLEX PO) Take 1 tablet by mouth daily.    Historical Provider, MD  beclomethasone (QVAR) 40 MCG/ACT inhaler Inhale 2 puffs into the lungs 2 (two) times daily as needed (for shortness of breath).    Historical Provider, MD  cetirizine (ZYRTEC) 10 MG tablet Take 10 mg by mouth daily.      Historical Provider, MD  HYDROcodone-acetaminophen (NORCO/VICODIN) 5-325 MG per tablet Take 1 tablet by mouth every 6 (six) hours as needed for pain. 10/03/12   Blanchie Dessert, MD  Multiple Vitamins-Minerals (MULTIVITAMIN WITH MINERALS) tablet Take 1 tablet by mouth daily.    Historical Provider, MD  ondansetron (ZOFRAN) 4 MG tablet Take 1 tablet (4 mg total) by mouth every 6 (six) hours. 10/03/12   Blanchie Dessert, MD  SPIRULINA PO Take 1 tablet by mouth daily. For food allergy prevention    Historical Provider, MD  Triage Vitals: BP 134/88  Pulse 100  Temp(Src) 97.7 F (36.5 C) (Oral)  Resp 20  Ht 5\' 3"  (1.6 m)  Wt 180 lb (81.647 kg)  BMI 31.89 kg/m2  SpO2 100%  Physical Exam  Nursing note and vitals reviewed. Constitutional: She is oriented to person, place, and time. She appears well-developed and well-nourished. No distress.  HENT:  Head: Normocephalic and atraumatic.  Eyes: Conjunctivae and EOM are normal.  Neck: Neck supple. No tracheal deviation present.  Cardiovascular: Normal rate.   Pulmonary/Chest:  Effort normal. No respiratory distress.  Abdominal: Soft. There is no tenderness.  Musculoskeletal: Normal range of motion.  Neurological: She is alert and oriented to person, place, and time.  Skin: Skin is warm and dry.  R index finger with puncture wound on end, purple around puncture. Finger cool, good cap refill. Normal ROM.  Psychiatric: She has a normal mood and affect. Her behavior is normal.    ED Course  Procedures (including critical care time)  DIAGNOSTIC STUDIES: Oxygen Saturation is 100% on room air, normal by my interpretation.    COORDINATION OF CARE:  7:47 PM- Discussed treatment plan with patient, and the patient agreed to the plan. The plan includes application of nitro-paste and consultation with a hand surgeon.   9:00 PM- Rechecked pt. Pt reports improvement to the coldness in the finger.   Labs Review Labs Reviewed - No data to display  Imaging Review No results found.   EKG Interpretation None      MDM   Final diagnoses:  Finger injury, left, initial encounter    32F injected EpiPen into L index finger about 1.5 hrs ago. Finger purple, improving with NTG ointment, which was on finger on my exam. Will observe with NTG ointment on finger. After 1 hour observation, much improved. Stable for discharge.  I personally performed the services described in this documentation, which was scribed in my presence. The recorded information has been reviewed and is accurate.     Osvaldo Shipper, MD 01/19/14 [phone]

## 2014-01-19 NOTE — ED Notes (Addendum)
Nitro paste removed from finger. Finger remains cool to the touch but cap refill is rapid. Bruising noted to pad of finger. Sensation is present except under the nail bed.

## 2014-01-19 NOTE — ED Notes (Signed)
Pt c/o accidental epi pen injection to left index finger x 1 hr ago

## 2014-10-14 ENCOUNTER — Telehealth: Payer: Self-pay | Admitting: Internal Medicine

## 2014-10-14 NOTE — Telephone Encounter (Signed)
error 

## 2014-10-17 ENCOUNTER — Ambulatory Visit (INDEPENDENT_AMBULATORY_CARE_PROVIDER_SITE_OTHER): Payer: BLUE CROSS/BLUE SHIELD | Admitting: Nurse Practitioner

## 2014-10-17 ENCOUNTER — Encounter: Payer: Self-pay | Admitting: Nurse Practitioner

## 2014-10-17 VITALS — BP 117/80 | HR 74 | Temp 97.9°F | Ht 63.0 in | Wt 196.0 lb

## 2014-10-17 DIAGNOSIS — M722 Plantar fascial fibromatosis: Secondary | ICD-10-CM

## 2014-10-17 NOTE — Progress Notes (Signed)
Pre visit review using our clinic review tool, if applicable. No additional management support is needed unless otherwise documented below in the visit note. 

## 2014-10-17 NOTE — Patient Instructions (Signed)
Start curmarin 1000 - 2000 mg daily.  Perform daily foot stretches for plantar fasciitis. See below. Hold positions for 10 seconds.Repeat 5 times. Do at least 3 times daily. Good shoes are key to foot pain. These are brands that I recommend: Merril, sanita, mephisto, new balance series 900 or higher, dansko, Klogs, Noat, Reiker, Associate Professor. 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 You must wear a shoes with good arch support and DO NOT walk barefooted while at home.   Please return for physical and/or weight management.  Very nice to meet you!  Plantar Fasciitis (Heel Spur Syndrome) with Rehab The plantar fascia is a fibrous, ligament-like, soft-tissue structure that spans the bottom of the foot. Plantar fasciitis is a condition that causes pain in the foot due to inflammation of the tissue. SYMPTOMS   Pain and tenderness on the underneath side of the foot.  Pain that worsens with standing or walking. CAUSES  Plantar fasciitis is caused by irritation and injury to the plantar fascia on the underneath side of the foot. Common mechanisms of injury include:  Direct trauma to bottom of the foot.  Damage to a small nerve that runs under the foot where the main fascia attaches to the heel bone.  Stress placed on the plantar fascia due to bone spurs. RISK INCREASES WITH:   Activities that place stress on the plantar fascia (running, jumping, pivoting, or cutting).  Poor strength and flexibility.  Improperly fitted shoes.  Tight calf muscles.  Flat feet.  Failure to warm-up properly before activity.  Obesity. PREVENTION  Warm up and stretch properly before activity.  Allow for adequate recovery between workouts.  Maintain physical fitness:  Strength, flexibility, and endurance.  Cardiovascular fitness.  Maintain a health body weight.  Avoid stress on the plantar fascia.  Wear properly fitted shoes, including arch supports for individuals who have  flat feet. PROGNOSIS  If treated properly, then the symptoms of plantar fasciitis usually resolve without surgery. However, occasionally surgery is necessary. RELATED COMPLICATIONS   Recurrent symptoms that may result in a chronic condition.  Problems of the lower back that are caused by compensating for the injury, such as limping.  Pain or weakness of the foot during push-off following surgery.  Chronic inflammation, scarring, and partial or complete fascia tear, occurring more often from repeated injections. TREATMENT  Treatment initially involves the use of ice and medication to help reduce pain and inflammation. The use of strengthening and stretching exercises may help reduce pain with activity, especially stretches of the Achilles tendon. These exercises may be performed at home or with a therapist. Your caregiver may recommend that you use heel cups of arch supports to help reduce stress on the plantar fascia. Occasionally, corticosteroid injections are given to reduce inflammation. If symptoms persist for greater than 6 months despite non-surgical (conservative), then surgery may be recommended.  MEDICATION   If pain medication is necessary, then nonsteroidal anti-inflammatory medications, such as aspirin and ibuprofen, or other minor pain relievers, such as acetaminophen, are often recommended.  Do not take pain medication within 7 days before surgery.  Prescription pain relievers may be given if deemed necessary by your caregiver. Use only as directed and only as much as you need.  Corticosteroid injections may be given by your caregiver. These injections should be reserved for the most serious cases, because they may only be given a certain number of times. HEAT AND COLD  Cold treatment (icing) relieves pain and reduces inflammation. Cold  treatment should be applied for 10 to 15 minutes every 2 to 3 hours for inflammation and pain and immediately after any activity that  aggravates your symptoms. Use ice packs or massage the area with a piece of ice (ice massage).  Heat treatment may be used prior to performing the stretching and strengthening activities prescribed by your caregiver, physical therapist, or athletic trainer. Use a heat pack or soak the injury in warm water. SEEK IMMEDIATE MEDICAL CARE IF:  Treatment seems to offer no benefit, or the condition worsens.  Any medications produce adverse side effects. EXERCISES RANGE OF MOTION (ROM) AND STRETCHING EXERCISES - Plantar Fasciitis (Heel Spur Syndrome) These exercises may help you when beginning to rehabilitate your injury. Your symptoms may resolve with or without further involvement from your physician, physical therapist or athletic trainer. While completing these exercises, remember:   Restoring tissue flexibility helps normal motion to return to the joints. This allows healthier, less painful movement and activity.  An effective stretch should be held for at least 30 seconds.  A stretch should never be painful. You should only feel a gentle lengthening or release in the stretched tissue. RANGE OF MOTION - Toe Extension, Flexion  Sit with your right / left leg crossed over your opposite knee.  Grasp your toes and gently pull them back toward the top of your foot. You should feel a stretch on the bottom of your toes and/or foot.  Hold this stretch for __________ seconds.  Now, gently pull your toes toward the bottom of your foot. You should feel a stretch on the top of your toes and or foot.  Hold this stretch for __________ seconds. Repeat __________ times. Complete this stretch __________ times per day.  RANGE OF MOTION - Ankle Dorsiflexion, Active Assisted  Remove shoes and sit on a chair that is preferably not on a carpeted surface.  Place right / left foot under knee. Extend your opposite leg for support.  Keeping your heel down, slide your right / left foot back toward the chair  until you feel a stretch at your ankle or calf. If you do not feel a stretch, slide your bottom forward to the edge of the chair, while still keeping your heel down.  Hold this stretch for __________ seconds. Repeat __________ times. Complete this stretch __________ times per day.  STRETCH - Gastroc, Standing  Place hands on wall.  Extend right / left leg, keeping the front knee somewhat bent.  Slightly point your toes inward on your back foot.  Keeping your right / left heel on the floor and your knee straight, shift your weight toward the wall, not allowing your back to arch.  You should feel a gentle stretch in the right / left calf. Hold this position for __________ seconds. Repeat __________ times. Complete this stretch __________ times per day. STRETCH - Soleus, Standing  Place hands on wall.  Extend right / left leg, keeping the other knee somewhat bent.  Slightly point your toes inward on your back foot.  Keep your right / left heel on the floor, bend your back knee, and slightly shift your weight over the back leg so that you feel a gentle stretch deep in your back calf.  Hold this position for __________ seconds. Repeat __________ times. Complete this stretch __________ times per day. STRETCH - Gastrocsoleus, Standing  Note: This exercise can place a lot of stress on your foot and ankle. Please complete this exercise only if specifically instructed by  your caregiver.   Place the ball of your right / left foot on a step, keeping your other foot firmly on the same step.  Hold on to the wall or a rail for balance.  Slowly lift your other foot, allowing your body weight to press your heel down over the edge of the step.  You should feel a stretch in your right / left calf.  Hold this position for __________ seconds.  Repeat this exercise with a slight bend in your right / left knee. Repeat __________ times. Complete this stretch __________ times per day.    STRENGTHENING EXERCISES - Plantar Fasciitis (Heel Spur Syndrome)  These exercises may help you when beginning to rehabilitate your injury. They may resolve your symptoms with or without further involvement from your physician, physical therapist or athletic trainer. While completing these exercises, remember:   Muscles can gain both the endurance and the strength needed for everyday activities through controlled exercises.  Complete these exercises as instructed by your physician, physical therapist or athletic trainer. Progress the resistance and repetitions only as guided. STRENGTH - Towel Curls  Sit in a chair positioned on a non-carpeted surface.  Place your foot on a towel, keeping your heel on the floor.  Pull the towel toward your heel by only curling your toes. Keep your heel on the floor.  If instructed by your physician, physical therapist or athletic trainer, add ____________________ at the end of the towel. Repeat __________ times. Complete this exercise __________ times per day. STRENGTH - Ankle Inversion  Secure one end of a rubber exercise band/tubing to a fixed object (table, pole). Loop the other end around your foot just before your toes.  Place your fists between your knees. This will focus your strengthening at your ankle.  Slowly, pull your big toe up and in, making sure the band/tubing is positioned to resist the entire motion.  Hold this position for __________ seconds.  Have your muscles resist the band/tubing as it slowly pulls your foot back to the starting position. Repeat __________ times. Complete this exercises __________ times per day.  Document Released: 06/17/2005 Document Revised: 09/09/2011 Document Reviewed: 09/29/2008 [facility] Patient Information 2015 [place], [place]. This information is not intended to replace advice given to you by your health care provider. Make sure you discuss any questions you have with your health care provider.

## 2014-10-18 DIAGNOSIS — M722 Plantar fascial fibromatosis: Secondary | ICD-10-CM | POA: Insufficient documentation

## 2014-10-18 NOTE — Progress Notes (Signed)
Subjective:    Angela Harrell is a 39 y.o. female who presents with bilateral foot pain. Onset of the symptoms was 1 ya L lateral foot, few mos ago R heel. Precipitating event: increased activity. Current symptoms include: pain w/weight bearing. Aggravating factors: any weight bearing. Symptoms have stabilized. Patient has had no prior foot problems. Evaluation to date: none. Treatment to date: rest and 400 mg ibuprophen-no relief.  The following portions of the patient's history were reviewed and updated as appropriate: allergies, current medications, past family history, past medical history, past social history, past surgical history and problem list.  Review of Systems Pertinent items are noted in HPI.    Objective:    BP 117/80 mmHg  Pulse 74  Temp(Src) 97.9 F (36.6 C) (Oral)  Ht 5\' 3"  (1.6 m)  Wt 196 lb (88.905 kg)  BMI 34.73 kg/m2  SpO2 100%  LMP 09/30/2014 (Approximate) Right foot:  point tenderness over the heel pad and ball of foot  Left foot:  no swelling, slight tenderness at lateral foot distal to ankle     Assessment:Plan   1. Plantar fasciitis, right Stretches Shoes w/arch support Start curmarin daily to decrease inflammation  F/u 3 weeks

## 2014-11-09 ENCOUNTER — Encounter: Payer: Self-pay | Admitting: Nurse Practitioner

## 2014-11-09 ENCOUNTER — Ambulatory Visit (INDEPENDENT_AMBULATORY_CARE_PROVIDER_SITE_OTHER): Payer: BLUE CROSS/BLUE SHIELD | Admitting: Nurse Practitioner

## 2014-11-09 VITALS — BP 116/79 | HR 75 | Temp 97.9°F | Ht 63.0 in | Wt 192.0 lb

## 2014-11-09 DIAGNOSIS — Z0000 Encounter for general adult medical examination without abnormal findings: Secondary | ICD-10-CM

## 2014-11-09 DIAGNOSIS — M545 Low back pain, unspecified: Secondary | ICD-10-CM | POA: Insufficient documentation

## 2014-11-09 DIAGNOSIS — Z1239 Encounter for other screening for malignant neoplasm of breast: Secondary | ICD-10-CM

## 2014-11-09 DIAGNOSIS — Z6833 Body mass index (BMI) 33.0-33.9, adult: Secondary | ICD-10-CM

## 2014-11-09 DIAGNOSIS — R7989 Other specified abnormal findings of blood chemistry: Secondary | ICD-10-CM | POA: Diagnosis not present

## 2014-11-09 DIAGNOSIS — E669 Obesity, unspecified: Secondary | ICD-10-CM | POA: Insufficient documentation

## 2014-11-09 DIAGNOSIS — Z6834 Body mass index (BMI) 34.0-34.9, adult: Secondary | ICD-10-CM

## 2014-11-09 DIAGNOSIS — R4589 Other symptoms and signs involving emotional state: Secondary | ICD-10-CM

## 2014-11-09 LAB — COMPREHENSIVE METABOLIC PANEL
ALBUMIN: 4.3 g/dL (ref 3.5–5.2)
ALT: 20 U/L (ref 0–35)
AST: 21 U/L (ref 0–37)
Alkaline Phosphatase: 69 U/L (ref 39–117)
BUN: 11 mg/dL (ref 6–23)
CALCIUM: 9.8 mg/dL (ref 8.4–10.5)
CHLORIDE: 100 meq/L (ref 96–112)
CO2: 30 meq/L (ref 19–32)
Creatinine, Ser: 0.81 mg/dL (ref 0.40–1.20)
GFR: 84.11 mL/min (ref >60.00)
Glucose, Bld: 106 mg/dL — ABNORMAL HIGH (ref 70–99)
Potassium: 4.2 mEq/L (ref 3.5–5.1)
SODIUM: 135 meq/L (ref 135–145)
TOTAL PROTEIN: 7.4 g/dL (ref 6.0–8.3)
Total Bilirubin: 0.4 mg/dL (ref 0.2–1.2)

## 2014-11-09 LAB — CBC WITH DIFFERENTIAL/PLATELET
Basophils Absolute: 0 10*3/uL (ref 0.0–0.1)
Basophils Relative: 0.3 % (ref 0.0–3.0)
Eosinophils Absolute: 0.4 10*3/uL (ref 0.0–0.7)
Eosinophils Relative: 4.7 % (ref 0.0–5.0)
HCT: 41.1 % (ref 36.0–46.0)
Hemoglobin: 13.8 g/dL (ref 12.0–15.0)
Lymphocytes Relative: 33.4 % (ref 12.0–46.0)
Lymphs Abs: 2.9 10*3/uL (ref 0.7–4.0)
MCHC: 33.6 g/dL (ref 30.0–36.0)
MCV: 85.4 fl (ref 78.0–100.0)
Monocytes Absolute: 0.5 10*3/uL (ref 0.1–1.0)
Monocytes Relative: 5.8 % (ref 3.0–12.0)
Neutro Abs: 4.9 10*3/uL (ref 1.4–7.7)
Neutrophils Relative %: 55.8 % (ref 43.0–77.0)
Platelets: 374 10*3/uL (ref 150.0–400.0)
RBC: 4.82 Mil/uL (ref 3.87–5.11)
RDW: 14 % (ref 11.5–15.5)
WBC: 8.8 10*3/uL (ref 4.0–10.5)

## 2014-11-09 LAB — LIPID PANEL
CHOL/HDL RATIO: 6
CHOLESTEROL: 236 mg/dL — AB (ref 0–200)
HDL: 38.2 mg/dL — ABNORMAL LOW (ref >39.00)
NonHDL: 197.8
TRIGLYCERIDES: 345 mg/dL — AB (ref 0.0–149.0)
VLDL: 69 mg/dL — ABNORMAL HIGH (ref 0.0–40.0)

## 2014-11-09 LAB — URINALYSIS, MICROSCOPIC ONLY: RBC / HPF: NONE SEEN (ref 0–?)

## 2014-11-09 LAB — T4, FREE: Free T4: 0.74 ng/dL (ref 0.60–1.60)

## 2014-11-09 LAB — TSH: TSH: 0.93 u[IU]/mL (ref 0.35–4.50)

## 2014-11-09 LAB — VITAMIN D 25 HYDROXY (VIT D DEFICIENCY, FRACTURES): VITD: 20.53 ng/mL — ABNORMAL LOW (ref 30.00–100.00)

## 2014-11-09 LAB — HEMOGLOBIN A1C: HEMOGLOBIN A1C: 5.9 % (ref 4.6–6.5)

## 2014-11-09 LAB — LDL CHOLESTEROL, DIRECT: Direct LDL: 137 mg/dL

## 2014-11-09 NOTE — Patient Instructions (Addendum)
For best health: Weight loss goal is 50  pounds. This will take 6 to 12 months  months. If you want to count calories, limit to 1200 to 1700  calories daily if you are exercising 1 to 3 times week & 1400 to 1900 calories if exercising 5 times weekly or more. Aim for 30 minutes each session. Helpful phone app for calorie counting is "Go Meals". Consider reading Eat to Live by Excell Seltzer and begin implementing principles for best human nutrition & health.  Refer to hand out for principles & suggested menu. When you diverge from the guide, get back to healthy choices with the next meal or snack.  As discussed,cut out refined sugar- anything that is sweet when you eat or drink it except fresh fruit.  Cut out refined grains- bread, rolls, biscuits, bagels, muffins, pasta and cereals that have less than 4 grams fiber per serving.  Limit animal fats & proteins to 3 to 4 times/week.  Develop lifelong habits of exercise most days of the week: take a 30 minute walk. The benefits include weight loss, lower risk for heart disease, diabetes, stroke, high blood pressure, lower rates of depression & dementia, better sleep quality & bone health.  Please return in 3 weeks to review changes, progress, and labs.

## 2014-11-09 NOTE — Progress Notes (Signed)
Pre visit review using our clinic review tool, if applicable. No additional management support is needed unless otherwise documented below in the visit note. 

## 2014-11-09 NOTE — Progress Notes (Signed)
Subjective:     Angela Harrell is a 39 y.o. female presents for f/u of plantar fascitis, desire to lose weight, & preventive care.  plantar fascitis: both feet. L foot hurts on lateral side. Pain improved since performing daily stretches & using arch inserts. Started walking again with minimal pain. desire to lose weight: BMI is 34. Discussed health risks r/t obesity. Uses food for comfort. Not exercising regularly. Weighed about 130 in HS. Started gaining in 20's. Has had kidney stone in past & told not to eat foods w/oxylate, so gets frustrated w/limitation of vegetables. Desire is to minimize health risk & feel better. Pt very tearful as discusses weight: I asked if we need to address depression before addressing wt loss-she starts to cry. See next. Depression: Hx of depression & mental illness in "half-brother". Treated in past w/unkn antidepressant-pt reports caused "blackouts & no emotion", so stopped med. Saw therapist in past-says helpful. 5 deaths in family this yr. Expresses fear of "becoming crazy like my brother". Asks if it is "normal to be tearful & not get things done every now & then". She says this most often occurs during [facility]. Denies symptoms rest of the month. Does not want meds. Denies SI. Preventive care: Increased risk for beast ca-mother age 98, recmd MMG. Vaccines UTD, pap last yr w/DR Ronita Hipps. Pt reports nml. Reg MC. Discussed benefits of reg exercise.   The following portions of the patient's history were reviewed and updated as appropriate: allergies, current medications, past family history, past medical history, past social history, past surgical history and problem list.  Review of Systems Constitutional: negative for fevers Eyes: positive for contacts/glasses, negative for visual disturbance and had eye exm few mos ago Ears, nose, mouth, throat, and face: negative for nasal congestion and sore throat Respiratory: negative for cough and wheezing Cardiovascular: negative for  exertional chest pressure/discomfort and palpitations Gastrointestinal: negative for abdominal pain, change in bowel habits, constipation, diarrhea and dyspepsia Genitourinary:negative for abnormal menstrual periods Integument/breast: positive for multiple moles-seeing derm once yr due to 2 moles on feet Allergic/Immunologic: positive for seasonal allergies   MSK: L sided back pain, intermittent, mild, no radiation   Objective:    BP 116/79 mmHg  Pulse 75  Temp(Src) 97.9 F (36.6 C) (Oral)  Ht 5\' 3"  (1.6 m)  Wt 192 lb (87.091 kg)  BMI 34.02 kg/m2  SpO2 100%  LMP 10/30/2014 BP 116/79 mmHg  Pulse 75  Temp(Src) 97.9 F (36.6 C) (Oral)  Ht 5\' 3"  (1.6 m)  Wt 192 lb (87.091 kg)  BMI 34.02 kg/m2  SpO2 100%  LMP 10/30/2014 General appearance: alert, cooperative, appears stated age and mild distress Head: Normocephalic, without obvious abnormality, atraumatic Eyes: negative findings: lids and lashes normal and conjunctivae and sclerae normal Ears: normal TM's and external ear canals both ears Throat: lips, mucosa, and tongue normal; teeth and gums normal Neck: no adenopathy, no carotid bruit, supple, symmetrical, trachea midline and thyroid not enlarged, symmetric, no tenderness/mass/nodules Back: no tenderness to percussion or palpation Lungs: clear to auscultation bilaterally Heart: regular rate and rhythm, S1, S2 normal, no murmur, click, rub or gallop Abdomen: soft, non-tender; bowel sounds normal; no masses,  no organomegaly Extremities: extremities normal, atraumatic, no cyanosis or edema Neurologic: Grossly normal    Assessment:Plan     1. Breast cancer screening increased risk due to fam Hx - MM DIGITAL SCREENING BILATERAL; Future  2. Preventative health care Increased risk for chronic disease due to obesity Wt loss recommended. See pt  instructions - CBC with Differential/Platelet - Comprehensive metabolic panel - Lipid panel - Hemoglobin A1c - TSH - T4, free -  Thyroid peroxidase antibody - Vit D  25 hydroxy (rtn osteoporosis monitoring) - Urine Microscopic  3. BMI 34.0-34.9,adult Specific diet & exercises changes discusses Wt loss goal established  4. Left-sided low back pain without sciatica Perform demonstrated back exercises daily.  5. Tearfulness Pt refuses meds. F/u 3 weeks- diet changes, wt, mental health

## 2014-11-10 LAB — THYROID PEROXIDASE ANTIBODY

## 2014-11-11 ENCOUNTER — Telehealth: Payer: Self-pay | Admitting: Nurse Practitioner

## 2014-11-11 DIAGNOSIS — E559 Vitamin D deficiency, unspecified: Secondary | ICD-10-CM

## 2014-11-11 MED ORDER — VITAMIN D3 1.25 MG (50000 UT) PO CAPS: 1.0000 | ORAL_CAPSULE | ORAL | Status: DC

## 2014-11-11 NOTE — Telephone Encounter (Signed)
pls call pt: Advise Few concerns w/labs:  Vit D too low: Low vitamin D is associated with higher incidence of disease & poor bone health. Start prescription vitamin D. Take 1 capsule weekly for 12 weeks. Level will be checked again in 3 mos. She is prediabetic and triglycerides are 2 times normal. Both of these labs reflect too much sugar in diet. Make changes discussed at appt. To normalize these numbers. All other labs nml. I will discuss further at f/u OV.

## 2014-11-11 NOTE — Telephone Encounter (Signed)
Called and informed patient of lab results.  

## 2014-11-16 ENCOUNTER — Ambulatory Visit: Payer: BLUE CROSS/BLUE SHIELD

## 2014-12-06 ENCOUNTER — Ambulatory Visit: Payer: BLUE CROSS/BLUE SHIELD | Admitting: Nurse Practitioner

## 2015-01-10 ENCOUNTER — Other Ambulatory Visit: Payer: Self-pay | Admitting: Radiology

## 2015-07-02 DIAGNOSIS — Z9889 Other specified postprocedural states: Secondary | ICD-10-CM

## 2015-07-02 HISTORY — DX: Other specified postprocedural states: Z98.890

## 2016-01-17 DIAGNOSIS — Z1231 Encounter for screening mammogram for malignant neoplasm of breast: Secondary | ICD-10-CM | POA: Diagnosis not present

## 2016-01-17 DIAGNOSIS — Z803 Family history of malignant neoplasm of breast: Secondary | ICD-10-CM | POA: Diagnosis not present

## 2016-02-02 ENCOUNTER — Encounter: Payer: Self-pay | Admitting: Family Medicine

## 2016-02-02 ENCOUNTER — Ambulatory Visit (INDEPENDENT_AMBULATORY_CARE_PROVIDER_SITE_OTHER): Payer: BLUE CROSS/BLUE SHIELD | Admitting: Family Medicine

## 2016-02-02 VITALS — BP 120/80 | HR 92 | Resp 12 | Ht 63.0 in | Wt 189.0 lb

## 2016-02-02 DIAGNOSIS — J4520 Mild intermittent asthma, uncomplicated: Secondary | ICD-10-CM

## 2016-02-02 DIAGNOSIS — E559 Vitamin D deficiency, unspecified: Secondary | ICD-10-CM | POA: Diagnosis not present

## 2016-02-02 DIAGNOSIS — Z6833 Body mass index (BMI) 33.0-33.9, adult: Secondary | ICD-10-CM

## 2016-02-02 DIAGNOSIS — E784 Other hyperlipidemia: Secondary | ICD-10-CM

## 2016-02-02 DIAGNOSIS — E785 Hyperlipidemia, unspecified: Secondary | ICD-10-CM

## 2016-02-02 LAB — BASIC METABOLIC PANEL
BUN: 11 mg/dL (ref 6–23)
CALCIUM: 10 mg/dL (ref 8.4–10.5)
CO2: 29 meq/L (ref 19–32)
CREATININE: 0.84 mg/dL (ref 0.40–1.20)
Chloride: 101 mEq/L (ref 96–112)
GFR: 80.14 mL/min (ref >60.00)
Glucose, Bld: 105 mg/dL — ABNORMAL HIGH (ref 70–99)
Potassium: 4.6 mEq/L (ref 3.5–5.1)
Sodium: 138 mEq/L (ref 135–145)

## 2016-02-02 LAB — VITAMIN D 25 HYDROXY (VIT D DEFICIENCY, FRACTURES): VITD: 39.49 ng/mL (ref 30.00–100.00)

## 2016-02-02 MED ORDER — ALBUTEROL SULFATE HFA 108 (90 BASE) MCG/ACT IN AERS: 2.0000 | INHALATION_SPRAY | Freq: Four times a day (QID) | RESPIRATORY_TRACT | 3 refills | Status: DC | PRN

## 2016-02-02 NOTE — Progress Notes (Signed)
HPI:   [name] is a 40 y.o. female, who is here today to establish care with me.  Former PCP: Dr Kathlen Mody.  Last preventive routine visit: 1-2 years ago. She follows with gyn for her preventive female care.  Concerns today: weight.  Hx of vit D deficiency, she was supposed to be on Ergocalciferol 50,000 U weekly but did not fill Rx. She started OTC Vit D ? U daily.  25 OH vit D 11/09/14 20.53.   Hx of HLD, she is on non pharmacologic treatment.  She states that she does not understand why she is not able to lose wt. She tries to follow a healthy diet, has not been exercising regularly because summer schedule and foot pain. She had seen her former PCP a few times for plantar fascitis.    Lab Results  Component Value Date   CHOL 236 (H) 11/09/2014   HDL 38.20 (L) 11/09/2014   LDLDIRECT 137.0 11/09/2014   TRIG 345.0 (H) 11/09/2014   CHOLHDL 6 11/09/2014    Asthma: She uses Albuterol inh as needed, usually symptoms are exacerbated by allergies and outdoors exercise. She is also on Cetirizine.  Anxiety on her problem list, which she denies. FHx of bipolar disorder, sibling.   Review of Systems  Constitutional: Negative for activity change, appetite change, fatigue, fever and unexpected weight change.  HENT: Positive for congestion and rhinorrhea. Negative for mouth sores, nosebleeds, sore throat and trouble swallowing.   Eyes: Negative for redness and visual disturbance.  Respiratory: Negative for cough, shortness of breath and wheezing.   Cardiovascular: Negative for chest pain, palpitations and leg swelling.  Gastrointestinal: Negative for abdominal pain, nausea and vomiting.       Negative for changes in bowel habits.  Endocrine: Negative for cold intolerance and heat intolerance.  Genitourinary: Negative for decreased urine volume, difficulty urinating, dysuria and hematuria.  Skin: Negative for color change and rash.  Allergic/Immunologic: Positive for  environmental allergies.  Neurological: Negative for seizures, syncope, weakness, numbness and headaches.  Psychiatric/Behavioral: Negative for confusion. The patient is nervous/anxious.       Current Outpatient Prescriptions on File Prior to Visit  Medication Sig Dispense Refill  . cetirizine (ZYRTEC) 10 MG tablet Take 10 mg by mouth daily.      . Multiple Vitamins-Minerals (MULTIVITAMIN WITH MINERALS) tablet Take 1 tablet by mouth daily.    . Probiotic Product (PROBIOTIC DAILY PO) Take by mouth.    . SPIRULINA PO Take 1 tablet by mouth daily. For food allergy prevention     No current facility-administered medications on file prior to visit.      Past Medical History:  Diagnosis Date  . ALLERGIC RHINITIS 05/31/2008  . Anemia   . ANXIETY 05/31/2008  . ASTHMA 05/31/2008  . CHOLELITHIASIS 05/31/2008  . High blood pressure    readings  . HYPERLIPIDEMIA 05/31/2008  . Kidney stone   . NEPHROLITHIASIS, HX OF 05/31/2008   Allergies  Allergen Reactions  . Prochlorperazine Edisylate Shortness Of Breath    Compazine Tongue swelling  . Banana Itching and Nausea And Vomiting  . Latex Hives  . Morphine And Related Itching  . Other     Tree Nuts (can have peanuts and cashews) Pyrethrine (ingredient in lice products)  . Oxycodone-Acetaminophen Nausea And Vomiting    Family History  Problem Relation Age of Onset  . Breast cancer Mother 48  . Arthritis Mother   . Hyperlipidemia Father     triglycerides  .  Mental illness Brother     bipolar  . Asthma Brother   . Allergic Disorder Daughter   . Cancer Maternal Uncle     mult myeloma  . Vascular Disease Maternal Uncle   . Heart disease Paternal Grandfather     Social History   Social History  . Marital status: Married    Spouse name: N/A  . Number of children: 3  . 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 Smoking status: Never Smoker  . Smokeless tobacco:  None  . Alcohol use No  . Drug use: No  . Sexual activity: Yes    Birth control/ protection: Surgical   Other Topics Concern  . None   Social History Narrative   Lives with husband & 3 kids, work at home, PT outside of home. Often volunteers.    Vitals:   02/02/16 0951  BP: 120/80  Pulse: 92  Resp: 12    Body mass index is 33.48 kg/m.   O2 sat 99% RA.   Physical Exam  Constitutional: She is oriented to person, place, and time. She appears well-developed. No distress.  HENT:  Head: Atraumatic.  Mouth/Throat: Oropharynx is clear and moist and mucous membranes are normal.  Eyes: Conjunctivae and EOM are normal. Pupils are equal, round, and reactive to light.  Neck: No tracheal deviation present. No thyroid mass and no thyromegaly present.  Cardiovascular: Normal rate and regular rhythm.   No murmur heard. Respiratory: Effort normal and breath sounds normal. No respiratory distress.  GI: Soft. She exhibits no mass. There is no hepatomegaly. There is no tenderness.  Musculoskeletal: She exhibits no edema.  Lymphadenopathy:    She has no cervical adenopathy.  Neurological: She is alert and oriented to person, place, and time. She has normal strength. Coordination and gait normal.  Skin: Skin is warm. No erythema.  Psychiatric: Her mood appears anxious.  Well groomed, good eye contact.      ASSESSMENT AND PLAN:   Lab Results  Component Value Date   CREATININE 0.84 02/02/2016   BUN 11 02/02/2016   NA 138 02/02/2016   K 4.6 02/02/2016   CL 101 02/02/2016   CO2 29 02/02/2016     Angela Harrell was seen today for new patient (initial visit).  Diagnoses and all orders for this visit:   Asthma, mild intermittent, uncomplicated  Stable. No changes in current management. F/U in 12 months, before if needed.   -     albuterol (PROVENTIL HFA) 108 (90 Base) MCG/ACT inhaler; Inhale 2 puffs into the lungs every 6 (six) hours as needed for wheezing or shortness of  breath.   BMI 33.0-33.9,adult  We discussed benefits of wt loss as well as adverse effects of obesity. Consistency with healthy diet and physical activity recommended. Weight Watchers is a good option as well as 150 min of weekly exercise as tolerated.   Dyslipidemia (high LDL; low HDL)  Low fat diet to continue, low red meet diet. 2 Oz of wine 4-5 times per week may help to increase HDL. F/U in 3 months.   Vitamin D deficiency  No changes in current management, will follow labs done today and will give further recommendations accordingly.  -     VITAMIN D 25 Hydroxy (Vit-D Deficiency, Fractures) -     Basic metabolic panel     -Planning on routine physical next OV, 3 months.  Aiden Helzer G. [place], MD  [facility]. [place] office.

## 2016-02-02 NOTE — Patient Instructions (Signed)
A few things to remember from today's visit:   Asthma, mild intermittent, uncomplicated - Plan: albuterol (PROVENTIL HFA) 108 (90 Base) MCG/ACT inhaler  BMI 33.0-33.9,adult  Dyslipidemia (high LDL; low HDL)  Vitamin D deficiency - Plan: VITAMIN D 25 Hydroxy (Vit-D Deficiency, Fractures), Basic metabolic panel  Weight Watchers is a good option for weight loss. Regular physical activity.  Please be sure medication list is accurate. If a new problem present, please set up appointment sooner than planned today.

## 2016-02-03 ENCOUNTER — Encounter: Payer: Self-pay | Admitting: Family Medicine

## 2016-05-07 ENCOUNTER — Encounter: Payer: Self-pay | Admitting: Family Medicine

## 2016-05-07 ENCOUNTER — Ambulatory Visit (INDEPENDENT_AMBULATORY_CARE_PROVIDER_SITE_OTHER): Payer: BLUE CROSS/BLUE SHIELD | Admitting: Family Medicine

## 2016-05-07 VITALS — BP 122/80 | HR 87 | Temp 98.2°F | Resp 12 | Ht 63.0 in | Wt 189.2 lb

## 2016-05-07 DIAGNOSIS — R739 Hyperglycemia, unspecified: Secondary | ICD-10-CM

## 2016-05-07 DIAGNOSIS — Z6833 Body mass index (BMI) 33.0-33.9, adult: Secondary | ICD-10-CM

## 2016-05-07 DIAGNOSIS — Z23 Encounter for immunization: Secondary | ICD-10-CM

## 2016-05-07 DIAGNOSIS — E782 Mixed hyperlipidemia: Secondary | ICD-10-CM

## 2016-05-07 LAB — LIPID PANEL
CHOL/HDL RATIO: 6
Cholesterol: 244 mg/dL — ABNORMAL HIGH (ref 0–200)
HDL: 41.7 mg/dL (ref >39.00)
LDL CALC: 162 mg/dL — AB (ref 0–99)
NONHDL: 201.83
TRIGLYCERIDES: 200 mg/dL — AB (ref 0.0–149.0)
VLDL: 40 mg/dL (ref 0.0–40.0)

## 2016-05-07 LAB — HEMOGLOBIN A1C: Hgb A1c MFr Bld: 5.9 % (ref 4.6–6.5)

## 2016-05-07 NOTE — Patient Instructions (Signed)
A few things to remember from today's visit:   Need for influenza vaccination - Plan: Flu Vaccine QUAD 36+ mos IM  Hyperlipemia, mixed - Plan: Lipid panel  BMI 33.0-33.9,adult  Hyperglycemia - Plan: Hemoglobin A1c  What are some tips for weight loss?  People become overweight for many reasons. Weight issues can run in families. They can be caused by unhealthy behaviors and a person's environment. Certain health problems and medicines can also lead to weight gain.  There are some simple things you can do to reach and maintain a healthy weight:  Eat small more frequent healthy meals instead 3 bid meals. Also Weight Watchers is a good option. Avoid sweet drinks. These include regular soft drinks, fruit juices, fruit drinks, energy drinks, sweetened iced tea, and flavored milk. Avoid fast foods. Fast foods such as french fries, hamburgers, chicken nuggets, and pizza are high in calories and can cause weight gain. Eat a healthy breakfast. People who skip breakfast tend to weigh more. Don't watch more than two hours of television per day. Chew sugar-free gum between meals to cut down on snacking. Avoid grocery shopping when you're hungry. Pack a healthy lunch instead of eating out to control what and how much you eat. Eat a lot of fruits and vegetables. Aim for about 2 cups of fruit and 2 to 3 cups of vegetables per day. Aim for 150 minutes per week of moderate-intensity exercise (such as brisk walking), or 75 minutes per week of vigorous exercise (such as jogging or running). OR 15-30 min of daily brisk walking. Be more active. Small changes in physical activity can easily be added to your daily routine. For example, take the stairs instead of the elevator. Take a walk with your family. A daily walk is a great way to get exercise and to catch up on the day's events.

## 2016-05-07 NOTE — Progress Notes (Signed)
HPI:   Angela Harrell is a 40 y.o. female, who is here today to follow on HLD.    Concerns today: not able to lose wt. She thinks that she eats healthy, she has decreased portions for ice cream and deserts in general.  Hyperlipidemia:  Currently on non pharmacologic treatment. Following a low fat diet: Yes.  Walking a few times per week.    Lab Results  Component Value Date   CHOL 236 (H) 11/09/2014   HDL 38.20 (L) 11/09/2014   LDLDIRECT 137.0 11/09/2014   TRIG 345.0 (H) 11/09/2014   CHOLHDL 6 11/09/2014   She tried "Reservital" instead wine, which was recommended to help increasing HDL but she does not like alcohol.  She also increased fiber intake.  Hyperglycemia , mild, in the past: 105,106,176. No Hx of DM.  Lab Results  Component Value Date   HGBA1C 5.9 11/09/2014     Review of Systems  Constitutional: Negative for activity change, appetite change, fatigue, fever and unexpected weight change.  HENT: Negative for mouth sores, nosebleeds and trouble swallowing.   Respiratory: Negative for cough, shortness of breath and wheezing.   Cardiovascular: Negative for chest pain, palpitations and leg swelling.  Gastrointestinal: Negative for abdominal pain, nausea and vomiting.       Negative for changes in bowel habits.  Endocrine: Negative for polydipsia, polyphagia and polyuria.  Genitourinary: Negative for decreased urine volume, difficulty urinating and hematuria.  Neurological: Negative for syncope, weakness and headaches.  Psychiatric/Behavioral: Negative for confusion. The patient is not nervous/anxious.       Current Outpatient Prescriptions on File Prior to Visit  Medication Sig Dispense Refill  . albuterol (PROVENTIL HFA) 108 (90 Base) MCG/ACT inhaler Inhale 2 puffs into the lungs every 6 (six) hours as needed for wheezing or shortness of breath. 1 Inhaler 3  . cetirizine (ZYRTEC) 10 MG tablet Take 10 mg by mouth daily.      . Multiple  Vitamins-Minerals (MULTIVITAMIN WITH MINERALS) tablet Take 1 tablet by mouth daily.    . Omega-3 Fatty Acids (FISH OIL) 1000 MG CAPS Take by mouth.    . SPIRULINA PO Take 1 tablet by mouth daily. For food allergy prevention    . TURMERIC PO Take by mouth.     No current facility-administered medications on file prior to visit.      Past Medical History:  Diagnosis Date  . ALLERGIC RHINITIS 05/31/2008  . Anemia   . ANXIETY 05/31/2008  . ASTHMA 05/31/2008  . CHOLELITHIASIS 05/31/2008  . High blood pressure    readings  . HYPERLIPIDEMIA 05/31/2008  . Kidney stone   . NEPHROLITHIASIS, HX OF 05/31/2008   Allergies  Allergen Reactions  . Prochlorperazine Edisylate Shortness Of Breath    Compazine Tongue swelling  . Banana Itching and Nausea And Vomiting  . Latex Hives  . Morphine And Related Itching  . Other     Tree Nuts (can have peanuts and cashews) Pyrethrine (ingredient in lice products)  . Oxycodone-Acetaminophen Nausea And Vomiting    Social History   Social History  . Marital status: Married    Spouse name: N/A  . Number of children: 3  . 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 Smoking status: Never Smoker  . Smokeless tobacco: None  . Alcohol use No  . Drug use: No  . Sexual activity: Yes  Birth control/ protection: Surgical   Other Topics Concern  . None   Social History Narrative   Lives with husband & 3 kids, work at home, PT outside of home. Often volunteers.    Vitals:   05/07/16 0927  BP: 122/80  Pulse: 87  Resp: 12  Temp: 98.2 F (36.8 C)   Body mass index is 33.52 kg/m.  Wt Readings from Last 3 Encounters:  05/07/16 189 lb 4 oz (85.8 kg)  02/02/16 189 lb (85.7 kg)  11/09/14 192 lb (87.1 kg)       Physical Exam  Nursing note and vitals reviewed. Constitutional: She is oriented to person, place, and time. She appears well-developed. No distress.  HENT:  Head:  Atraumatic.  Mouth/Throat: Oropharynx is clear and moist and mucous membranes are normal.  Eyes: Conjunctivae and EOM are normal. Pupils are equal, round, and reactive to light.  Neck: No tracheal deviation present. No thyroid mass and no thyromegaly present.  Cardiovascular: Normal rate and regular rhythm.   No murmur heard. Pulses:      Dorsalis pedis pulses are 2+ on the right side, and 2+ on the left side.  Respiratory: Effort normal and breath sounds normal. No respiratory distress.  GI: Soft. She exhibits no mass. There is no hepatomegaly. There is no tenderness.  Musculoskeletal: She exhibits no edema.  Neurological: She is alert and oriented to person, place, and time. She has normal strength. Coordination normal.  Skin: Skin is warm. No erythema.  Psychiatric: She has a normal mood and affect.  Well groomed, good eye contact.      ASSESSMENT AND PLAN:     Angela Harrell was seen today for follow-up.  Diagnoses and all orders for this visit:   Lab Results  Component Value Date   HGBA1C 5.9 05/07/2016   Lab Results  Component Value Date   CHOL 244 (H) 05/07/2016   HDL 41.70 05/07/2016   LDLCALC 162 (H) 05/07/2016   LDLDIRECT 137.0 11/09/2014   TRIG 200.0 (H) 05/07/2016   CHOLHDL 6 05/07/2016     Hyperglycemia  Healthful diet and regular exercise to help with primary prevention. She meets some criteria for metabolic synd: HDL < 50, FG > 100, waist circumference > 88 cm, TG > 150, and BMI > 30.  -     Hemoglobin A1c  Hyperlipemia, mixed  Continue low fat diet. Pharmacologic treatment will be recommended depending of lab results, Simvastatin if still not well controlled.   -     Lipid panel  BMI 33.0-33.9,adult  In general wt is stable. We discussed benefits of wt loss as well as adverse effects of obesity. Consistency with healthy diet and physical activity recommended. If interested Weight Watchers is a good option as well as daily brisk walking for 15-30  min as tolerated. Explained that she needs to find a diet that works for her and that she can maintain, in general low animal fat, rich in vegetables, good fiber intake. Mediterranean diet is another options.   Need for influenza vaccination -     Flu Vaccine QUAD 36+ mos IM       -Ms. Jayli Gutterman was advised to return sooner than planned today if new concerns arise.       Dalaney Needle G. Martin[place], MD  [facility]. [place] office.

## 2016-11-04 ENCOUNTER — Ambulatory Visit (INDEPENDENT_AMBULATORY_CARE_PROVIDER_SITE_OTHER): Payer: BLUE CROSS/BLUE SHIELD | Admitting: Family Medicine

## 2016-11-04 ENCOUNTER — Encounter: Payer: Self-pay | Admitting: Family Medicine

## 2016-11-04 VITALS — BP 130/78 | HR 96 | Resp 12 | Ht 63.0 in | Wt 192.1 lb

## 2016-11-04 DIAGNOSIS — M79622 Pain in left upper arm: Secondary | ICD-10-CM | POA: Diagnosis not present

## 2016-11-04 DIAGNOSIS — Z23 Encounter for immunization: Secondary | ICD-10-CM | POA: Diagnosis not present

## 2016-11-04 DIAGNOSIS — E782 Mixed hyperlipidemia: Secondary | ICD-10-CM | POA: Diagnosis not present

## 2016-11-04 DIAGNOSIS — Z6833 Body mass index (BMI) 33.0-33.9, adult: Secondary | ICD-10-CM

## 2016-11-04 NOTE — Progress Notes (Signed)
HPI:   Angela Harrell is a 41 y.o. female, who is here today to follow on some chronic medical problems.  Last OV 05/07/16. She has been concerned about obesity. She did not bring a food diary because she does not think it is needed, she feels like she eats healthy.  Dietary changes since her last OV: She is still eating ice cream 2 times per week, makes oatmeal cookies "not even once a month" Some examples: Breakfast: Yogurt with nuts and add Chia seed 2 tsps, 1 egg with avocado. No snack. Lunch: Left over veggies pizza: one slide (yesterday). Chicken curry made at home with vegetables, potatoes for a few lunches, a few days she has had humus and celery and avocados. Snacks: "depends of the day", humus with vegetable and "sometimes with crackers "maybe 5" "a do not know" Dinner: Left overs of lunch or dinner the days before. "Taco type thing" every other week, veggie or chicken with vegetables.  Red wine a few nights, 1/2 glass.   "Not too many" chips and "not much bread"   Exercising: walking, elliptical most of the days "trying 30 min", a mile on her elliptical and while she is talking with somebody on the phone because it is "boring." Latter during interview she tells me that she is not sure about time because her elliptical does not have a timer.   Left shoulder arm pain, c/o 2 weeks ago,intermittent, better now.  She can not specified exacerbating or alleviating factors. No edema or erythema. No limitation of ROM. She thinks it may be due to holding her 64 month old with left UE. She denies numbness or tingling, no associated cervical pain. She has not taken OTC analgesics.  HDL: She did not try Simvastatin as recommended.    Lab Results  Component Value Date   CHOL 244 (H) 05/07/2016   HDL 41.70 05/07/2016   LDLCALC 162 (H) 05/07/2016   LDLDIRECT 137.0 11/09/2014   TRIG 200.0 (H) 05/07/2016   CHOLHDL 6 05/07/2016    She thinks she needs her  Tdap.  Review of Systems  Constitutional: Negative for activity change, appetite change, fatigue and fever.  HENT: Negative for mouth sores, nosebleeds and trouble swallowing.   Respiratory: Negative for shortness of breath and wheezing.   Cardiovascular: Negative for chest pain, palpitations and leg swelling.  Gastrointestinal: Negative for abdominal pain, nausea and vomiting.       Negative for changes in bowel habits.  Endocrine: Negative for cold intolerance, heat intolerance, polydipsia, polyphagia and polyuria.  Genitourinary: Negative for decreased urine volume and hematuria.  Musculoskeletal: Positive for arthralgias and myalgias.  Skin: Negative for pallor and rash.  Neurological: Negative for syncope, weakness and headaches.  Psychiatric/Behavioral: Negative for confusion. The patient is nervous/anxious.     Current Outpatient Prescriptions on File Prior to Visit  Medication Sig Dispense Refill  . albuterol (PROVENTIL HFA) 108 (90 Base) MCG/ACT inhaler Inhale 2 puffs into the lungs every 6 (six) hours as needed for wheezing or shortness of breath. 1 Inhaler 3  . cetirizine (ZYRTEC) 10 MG tablet Take 10 mg by mouth daily.      . Cholecalciferol (VITAMIN D3 PO) Take 1 tablet by mouth daily.    . Multiple Vitamins-Minerals (MULTIVITAMIN WITH MINERALS) tablet Take 1 tablet by mouth daily.    . Omega-3 Fatty Acids (FISH OIL) 1000 MG CAPS Take by mouth.    . SPIRULINA PO Take 1 tablet by mouth daily. For food  allergy prevention    . TURMERIC PO Take by mouth.     No current facility-administered medications on file prior to visit.      Past Medical History:  Diagnosis Date  . ALLERGIC RHINITIS 05/31/2008  . Anemia   . ANXIETY 05/31/2008  . ASTHMA 05/31/2008  . CHOLELITHIASIS 05/31/2008  . High blood pressure    readings  . HYPERLIPIDEMIA 05/31/2008  . Kidney stone   . NEPHROLITHIASIS, HX OF 05/31/2008   Allergies  Allergen Reactions  . Prochlorperazine Edisylate Shortness  Of Breath    Compazine Tongue swelling  . Banana Itching and Nausea And Vomiting  . Latex Hives  . Morphine And Related Itching  . Other     Tree Nuts (can have peanuts and cashews) Pyrethrine (ingredient in lice products)  . Oxycodone-Acetaminophen Nausea And Vomiting    Social History   Social History  . Marital status: Married    Spouse name: N/A  . Number of children: 3  . 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 Smoking status: Never Smoker  . Smokeless tobacco: Never Used  . Alcohol use No  . Drug use: No  . Sexual activity: Yes    Birth control/ protection: Surgical   Other Topics Concern  . None   Social History Narrative   Lives with husband & 3 kids, work at home, PT outside of home. Often volunteers.    Vitals:   11/04/16 0946  BP: 130/78  Pulse: 96  Resp: 12  O2 sat at RA 97% Body mass index is 34.03 kg/m.  Wt Readings from Last 3 Encounters:  11/04/16 192 lb 2 oz (87.1 kg)  05/07/16 189 lb 4 oz (85.8 kg)  02/02/16 189 lb (85.7 kg)    Physical Exam  Nursing note and vitals reviewed. Constitutional: She is oriented to person, place, and time. She appears well-developed. No distress.  HENT:  Head: Atraumatic.  Mouth/Throat: Oropharynx is clear and moist and mucous membranes are normal.  Eyes: Conjunctivae and EOM are normal.  Cardiovascular: Normal rate and regular rhythm.   No murmur heard. Pulses:      Dorsalis pedis pulses are 2+ on the right side, and 2+ on the left side.  Respiratory: Effort normal and breath sounds normal. No respiratory distress.  GI: Soft. She exhibits no mass. There is no hepatomegaly. There is no tenderness.  Musculoskeletal: She exhibits no edema or tenderness.       Left shoulder: She exhibits normal range of motion, no tenderness and no bony tenderness.  Lymphadenopathy:    She has no cervical adenopathy.  Neurological: She is alert and  oriented to person, place, and time. She has normal strength. Gait normal.  Skin: Skin is warm. No rash noted. No erythema.  Psychiatric: Her mood appears anxious.  Appropriately groomed, good eye contact.    ASSESSMENT AND PLAN:   Angela Harrell was seen today for follow-up.  Diagnoses and all orders for this visit:  Hyperlipemia, mixed  She would like to continue non pharmacologic treatment. Will re-evaluate in 6 months.  BMI 33.0-33.9,adult  Gained about 3 lb sine 05/2016. We discussed benefits of wt loss as well as adverse effects of obesity. Consistency with healthy diet and physical activity recommended. Brisk walking for 15-30 min as tolerated.  Left upper arm pain  Examination today otherwise negative. Avoid trigger factors she has identified, like holding her child for  long periods of time. Improving, she can try OTC Tylenol,NSAID's, and/or topical Icy hot with Lidocaine. Also ROM exercises. F/U as needed.  Need for tetanus, diphtheria, and acellular pertussis (Tdap) vaccine -     Tdap vaccine greater than or equal to 7yo IM      Face to face from 9:59 am to 10:42 am. 43 min face to face OV. > 50% was dedicated to discussion of Dx,prognosis,treatment options, and some side effects of pharmacologic treatments options. It is difficult to obtain details in regard to her diet. We reviewed specifics of meals examples she reported, she does not think her dietary choices are contributing to her difficulties with wt loss. She is not interested in pharmacologic treatments.       -Angela Harrell was advised to return sooner than planned today if new concerns arise.       Angela Harrell G. [place], MD  [facility]. [place] office.

## 2016-11-04 NOTE — Progress Notes (Signed)
Pre visit review using our clinic review tool, if applicable. No additional management support is needed unless otherwise documented below in the visit note. 

## 2016-11-04 NOTE — Patient Instructions (Addendum)
A few things to remember from today's visit:   Hyperlipemia, mixed  BMI 33.0-33.9,adult  Left upper arm pain  What are some tips for weight loss? People become overweight for many reasons. Weight issues can run in families. They can be caused by unhealthy behaviors and a person's environment. Certain health problems and medicines can also lead to weight gain. There are some simple things you can do to reach and maintain a healthy weight:  Eat small more frequent healthy meals instead 3 bid meals. Also Weight Watchers is a good option. Avoid sweet drinks. These include regular soft drinks, fruit juices, fruit drinks, energy drinks, sweetened iced tea, and flavored milk. Avoid fast foods. Fast foods such as french fries, hamburgers, chicken nuggets, and pizza are high in calories and can cause weight gain. Eat a healthy breakfast. People who skip breakfast tend to weigh more. Don't watch more than two hours of television per day. Chew sugar-free gum between meals to cut down on snacking. Avoid grocery shopping when you're hungry. Pack a healthy lunch instead of eating out to control what and how much you eat. Eat a lot of fruits and vegetables. Aim for about 2 cups of fruit and 2 to 3 cups of vegetables per day. Aim for 150 minutes per week of moderate-intensity exercise (such as brisk walking), or 75 minutes per week of vigorous exercise (such as jogging or running). OR 15-30 min of daily brisk walking. Be more active. Small changes in physical activity can easily be added to your daily routine. For example, take the stairs instead of the elevator. Take a walk with your family. A daily walk is a great way to get exercise and to catch up on the day's events.   Please be sure medication list is accurate. If a new problem present, please set up appointment sooner than planned today.

## 2016-12-19 ENCOUNTER — Encounter: Payer: Self-pay | Admitting: Family Medicine

## 2016-12-19 ENCOUNTER — Ambulatory Visit (INDEPENDENT_AMBULATORY_CARE_PROVIDER_SITE_OTHER)
Admission: RE | Admit: 2016-12-19 | Discharge: 2016-12-19 | Disposition: A | Discharge status: Home / Self Care | Payer: BLUE CROSS/BLUE SHIELD | Source: Ambulatory Visit | Attending: Family Medicine | Admitting: Family Medicine

## 2016-12-19 ENCOUNTER — Ambulatory Visit (INDEPENDENT_AMBULATORY_CARE_PROVIDER_SITE_OTHER): Payer: BLUE CROSS/BLUE SHIELD | Admitting: Family Medicine

## 2016-12-19 VITALS — BP 120/84 | HR 92 | Temp 98.2°F | Wt 193.2 lb

## 2016-12-19 DIAGNOSIS — S6992XA Unspecified injury of left wrist, hand and finger(s), initial encounter: Secondary | ICD-10-CM | POA: Diagnosis not present

## 2016-12-19 DIAGNOSIS — S60012A Contusion of left thumb without damage to nail, initial encounter: Secondary | ICD-10-CM | POA: Diagnosis not present

## 2016-12-19 NOTE — Patient Instructions (Signed)
It was a pleasure to see you today.  Please go to [facility] - located [address] across [address] from [place] - in the basement - Hours: 8:30-5:30 PM M-F. Do not need appointment.    Thumb Sprain A thumb sprain is an injury to one of the strong bands of tissue (ligaments) that connect the bones in your thumb. The ligament can be stretched too much or it can tear. A tear can be either partial or complete. The severity of the sprain depends on how much of the ligament was damaged or torn. What are the causes? A thumb sprain is often caused by a fall or an accident. If you extend your hands to catch an object or to protect yourself, the force of the impact can cause your ligament to stretch too much. This excess tension can also cause your ligament to tear. What increases the risk? This injury is more likely to occur in people who play:  Sports that involve a greater risk of falling, such as skiing.  Sports that involve catching an object, such as basketball.  What are the signs or symptoms? Symptoms of this condition include:  Loss of motion in your thumb.  Bruising.  Tenderness.  Swelling.  How is this diagnosed? This condition is diagnosed with a medical history and physical exam. You may also have an X-ray of your thumb. How is this treated? Treatment varies depending on the severity of your sprain. If your ligament is overstretched or partially torn, treatment usually involves keeping your thumb in a fixed position (immobilization) for a period of time. To help you do this, your health care provider will apply a bandage, cast, or splint to keep your thumb from moving until it heals. If your ligament is fully torn, you may need surgery to reconnect the ligament to the bone. After surgery, a cast or splint will be applied and will need to stay on your thumb while it heals. Your health care provider may also suggest exercises or physical therapy to strengthen your  thumb. Follow these instructions at home: If you have a cast:  Do not stick anything inside the cast to scratch your skin. Doing that increases your risk of infection.  Check the skin around the cast every day. Report any concerns to your health care provider. You may put lotion on dry skin around the edges of the cast. Do not apply lotion to the skin underneath the cast.  Keep the cast clean and dry. If you have a splint:  Wear it as directed by your health care provider. Remove it only as directed by your health care provider.  Loosen the splint if your fingers become numb and tingle, or if they turn cold and blue.  Keep the splint clean and dry. Bathing  Cover the bandage, cast, or splint with a watertight plastic bag to protect it from water while you take a bath or a shower. Do not let the bandage, cast, or splint get wet. Managing pain, stiffness, and swelling  If directed, apply ice to the injured area (unless you have a cast): ? Put ice in a plastic bag. ? Place a towel between your skin and the bag. ? Leave the ice on for 20 minutes, 2-3 times per day.  Move your fingers often to avoid stiffness and to lessen swelling.  Raise (elevate) the injured area above the level of your heart while you are sitting or lying down. Driving  Do not  drive or operate heavy machinery while taking pain medicine.  Do not drive while wearing a cast or splint on a hand that you use for driving. General instructions  Do not put pressure on any part of your cast or splint until it is fully hardened. This may take several hours.  Take medicines only as directed by your health care provider. These include over-the-counter medicines and prescription medicines.  Keep all follow-up visits as directed by your health care provider. This is important.  Do any exercise or physical therapy as directed by your health care provider.  Do not wear rings on your injured thumb. Contact a health care  provider if:  Your pain is not controlled with medicine.  Your bruising or swelling gets worse.  Your cast or splint is damaged. Get help right away if:  Your thumb is numb or blue.  Your thumb feels colder than normal. This information is not intended to replace advice given to you by your health care provider. Make sure you discuss any questions you have with your health care provider. Document Released: 07/25/2004 Document Revised: 02/18/2016 Document Reviewed: 03/29/2014 Elsevier Interactive Patient Education  [employer].

## 2016-12-19 NOTE — Progress Notes (Signed)
Subjective:    Patient ID: Angela Harrell, female    DOB: 09-17-1975, 41 y.o.   MRN: [id number]  HPI  Angela Harrell is a 41 year old female who reports an injury to her left thumb  that occurred 4 days ago. She described this occurred after "wrangling" young children that resulted in her bending/stretching her left thumb backward. She reports hearing a "noise" that was associated with minimal bruising and swelling that was present which has improved.  Pain was noted at the time of the incident but she is not experiencing any pain at this time unless she moves her thumb aggressively. She denies numbness, tingling, weakness, or loss of sensation. Treatment with ibuprofen 200 mg every 8 hours has provided moderate benefit.   Alleviating factor noted with stabilization of an ACE wrap; aggravating factor noted with movement.  Review of Systems  Constitutional: Negative for chills, fatigue and fever.  Respiratory: Negative for cough, shortness of breath and wheezing.   Cardiovascular: Negative for chest pain and palpitations.  Gastrointestinal: Negative for abdominal pain, constipation, diarrhea, nausea and vomiting.  Musculoskeletal:       Left thumb swelling due to injury  Skin: Negative for color change and rash.   Past Medical History:  Diagnosis Date  . ALLERGIC RHINITIS 05/31/2008  . Anemia   . ANXIETY 05/31/2008  . ASTHMA 05/31/2008  . CHOLELITHIASIS 05/31/2008  . High blood pressure    readings  . HYPERLIPIDEMIA 05/31/2008  . Kidney stone   . NEPHROLITHIASIS, HX OF 05/31/2008     Social History   Social History  . Marital status: Married    Spouse name: N/A  . Number of children: 3  . 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 Smoking status: Never Smoker  . Smokeless tobacco: Never Used  . Alcohol use No  . Drug use: No  . Sexual activity: Yes    Birth control/ protection: Surgical   Other Topics Concern  .  Not on file   Social History Narrative   Lives with husband & 3 kids, work at home, PT outside of home. Often volunteers.    Past Surgical History:  Procedure Laterality Date  . CESAREAN SECTION  [phone]  . CHOLECYSTECTOMY  1999  . KIDNEY STONE SURGERY    . ORIF CONGENITAL HIP DISLOCATION      Family History  Problem Relation Age of Onset  . Breast cancer Mother 60  . Arthritis Mother   . Hyperlipidemia Father        triglycerides  . Mental illness Brother        bipolar  . Asthma Brother   . Allergic Disorder Daughter   . Cancer Maternal Uncle        mult myeloma  . Vascular Disease Maternal Uncle   . Heart disease Paternal Grandfather     Allergies  Allergen Reactions  . Prochlorperazine Edisylate Shortness Of Breath    Compazine Tongue swelling  . Banana Itching and Nausea And Vomiting  . Latex Hives  . Morphine And Related Itching  . Other     Tree Nuts (can have peanuts and cashews) Pyrethrine (ingredient in lice products)  . Oxycodone-Acetaminophen Nausea And Vomiting    Current Outpatient Prescriptions on File Prior to Visit  Medication Sig Dispense Refill  . albuterol (PROVENTIL HFA) 108 (90 Base) MCG/ACT inhaler Inhale 2 puffs into the lungs every 6 (six)  hours as needed for wheezing or shortness of breath. 1 Inhaler 3  . cetirizine (ZYRTEC) 10 MG tablet Take 10 mg by mouth daily.      . Cholecalciferol (VITAMIN D3 PO) Take 1 tablet by mouth daily.    . LUTEIN PO Take 1 capsule by mouth daily.    . Multiple Vitamins-Minerals (MULTIVITAMIN WITH MINERALS) tablet Take 1 tablet by mouth daily.    . Omega-3 Fatty Acids (FISH OIL) 1000 MG CAPS Take by mouth.    . Probiotic Product (PROBIOTIC PO) Take 1 tablet by mouth every other day.    . PSYLLIUM HUSK PO Take 2 capsules by mouth daily.    Marland Kitchen SPIRULINA PO Take 1 tablet by mouth daily. For food allergy prevention    . TURMERIC PO Take by mouth.     No current facility-administered medications on file  prior to visit.     BP 120/84 (BP Location: Right Arm, Patient Position: Sitting, Cuff Size: Normal)   Pulse 92   Temp 98.2 F (36.8 C) (Oral)   Wt 193 lb 3.2 oz (87.6 kg)   LMP 12/07/2016 (Exact Date)   SpO2 98%   BMI 34.22 kg/m      Objective:   Physical Exam  Constitutional: She is oriented to person, place, and time. She appears well-developed and well-nourished.  Eyes: Pupils are equal, round, and reactive to light. No scleral icterus.  Neck: Neck supple.  Cardiovascular: Normal rate and regular rhythm.   Pulmonary/Chest: Effort normal and breath sounds normal. She has no wheezes. She has no rales.  Abdominal: Bowel sounds are normal.  Musculoskeletal:  Left thumb exhibits mild edema with an area of ecchymosis approximately 5 mm x 5 mm located over proximal phalanx. Minimal tenderness to palpation. She is able to bend thumb. Capillary refill <2 sec.  Lymphadenopathy:    She has no cervical adenopathy.  Neurological: She is alert and oriented to person, place, and time.  Skin: Skin is warm and dry. No rash noted.  Psychiatric: She has a normal mood and affect. Her behavior is normal. Judgment and thought content normal.       Assessment & Plan:  1. Injury of left thumb, initial encounter Exam is reassuring; patient reports improvement; minimal edema present; suspect  Possible sprain sprain vs. Fracture but will obtain X-ray evaluation. Attempted to immobilize area with a splint however there was not one available that was proper fitting for patient. Patient has been wrapping area in an ACE wrap and preferred to have this method. Provided written instructions for patient and advised follow up with PCP for further evaluation if symptoms do not continue to improve or if she has increased pain or difficulty moving thumb. - DG Finger Thumb Left; Future  Angela Metz, FNP-C

## 2017-01-28 ENCOUNTER — Other Ambulatory Visit: Payer: Self-pay | Admitting: Emergency Medicine

## 2017-01-28 ENCOUNTER — Encounter: Payer: Self-pay | Admitting: Internal Medicine

## 2017-01-28 ENCOUNTER — Ambulatory Visit (INDEPENDENT_AMBULATORY_CARE_PROVIDER_SITE_OTHER): Payer: BLUE CROSS/BLUE SHIELD | Admitting: Internal Medicine

## 2017-01-28 VITALS — BP 120/80 | HR 89 | Temp 98.3°F | Wt 189.5 lb

## 2017-01-28 DIAGNOSIS — Z87442 Personal history of urinary calculi: Secondary | ICD-10-CM

## 2017-01-28 DIAGNOSIS — R319 Hematuria, unspecified: Secondary | ICD-10-CM | POA: Diagnosis not present

## 2017-01-28 LAB — POC URINALSYSI DIPSTICK (AUTOMATED)
Bilirubin, UA: NEGATIVE
GLUCOSE UA: NEGATIVE
Ketones, UA: NEGATIVE
Leukocytes, UA: NEGATIVE
Nitrite, UA: NEGATIVE
PH UA: 6 (ref 5.0–8.0)
SPEC GRAV UA: 1.02 (ref 1.010–1.025)
UROBILINOGEN UA: 0.2 U/dL

## 2017-01-28 LAB — URINALYSIS, MICROSCOPIC ONLY

## 2017-01-28 NOTE — Patient Instructions (Addendum)
Certainly  Could be a kidney stone  Based on hx and urinalysis  And not infection.   considier  Getting a scan if  persistent or progressive  And fu needed  Strain  Your urine.   To catch   stone or gravel possible.    If continuing and no stone   Advise imagine study  Such as ct  Or even ultrasound ( not as good   To define  Problems)  .   Plan rov  With Dr   Martinique in   1-2 weeks  Or as needed .

## 2017-01-28 NOTE — Addendum Note (Signed)
Addended by: Tomi Likens on: 01/28/2017 05:07 PM   Modules accepted: Orders

## 2017-01-28 NOTE — Progress Notes (Signed)
Chief Complaint  Patient presents with  . Acute Visit    HPI: Angela Harrell 41 y.o. SDA  PCP Dr Martinique  Urine was really dark yestserday and had lower  Back pain and cramping  Radiated some to left front but now  No pain  Did have nausea and no vomiting .    Has pix of urin was pinkish  Color   And dark yellow this am  Some pink today    bn sig  Pain today and no fever uti s x  Dysuria  .  Hx of  kidneu stones    Work up  Sunday  2 days ago with nausea like  When had stone with feeling like    .Marland Kitchen    Last kidney stone    2014?        montly periods     But irreg .    Cramps  Are better .    Since childbirth  Husband vasectomy    ROS: See pertinent positives and negatives per HPI. No current vomiting  Pain   Diarrhea     Past Medical History:  Diagnosis Date  . ALLERGIC RHINITIS 05/31/2008  . Anemia   . ANXIETY 05/31/2008  . ASTHMA 05/31/2008  . CHOLELITHIASIS 05/31/2008  . High blood pressure    readings  . HYPERLIPIDEMIA 05/31/2008  . Kidney stone   . NEPHROLITHIASIS, HX OF 05/31/2008    Family History  Problem Relation Age of Onset  . Breast cancer Mother 50  . Arthritis Mother   . Hyperlipidemia Father        triglycerides  . Mental illness Brother        bipolar  . Asthma Brother   . Allergic Disorder Daughter   . Cancer Maternal Uncle        mult myeloma  . Vascular Disease Maternal Uncle   . Heart disease Paternal Grandfather     Social History   Social History  . Marital status: Married    Spouse name: N/A  . Number of children: 3  . 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 Smoking status: Never Smoker  . Smokeless tobacco: Never Used  . Alcohol use No  . Drug use: No  . Sexual activity: Yes    Birth control/ protection: Surgical   Other Topics Concern  . None   Social History Narrative   Lives with husband & 3 kids, work at home, PT outside of home. Often volunteers.     Outpatient Medications Prior to Visit  Medication Sig Dispense Refill  . albuterol (PROVENTIL HFA) 108 (90 Base) MCG/ACT inhaler Inhale 2 puffs into the lungs every 6 (six) hours as needed for wheezing or shortness of breath. 1 Inhaler 3  . cetirizine (ZYRTEC) 10 MG tablet Take 10 mg by mouth daily.      . Cholecalciferol (VITAMIN D3 PO) Take 1 tablet by mouth daily.    . LUTEIN PO Take 1 capsule by mouth daily.    . Multiple Vitamins-Minerals (MULTIVITAMIN WITH MINERALS) tablet Take 1 tablet by mouth daily.    . Omega-3 Fatty Acids (FISH OIL) 1000 MG CAPS Take by mouth.    . Probiotic Product (PROBIOTIC PO) Take 1 tablet by mouth every other day.    . PSYLLIUM HUSK PO Take 2 capsules by mouth daily.    Marland Kitchen SPIRULINA PO Take 1 tablet by  mouth daily. For food allergy prevention    . TURMERIC PO Take by mouth.     No facility-administered medications prior to visit.      EXAM:  BP 120/80 (BP Location: Left Arm, Patient Position: Sitting, Cuff Size: Normal)   Pulse 89   Temp 98.3 F (36.8 C) (Oral)   Wt 189 lb 8 oz (86 kg)   BMI 33.57 kg/m   Body mass index is 33.57 kg/m.  GENERAL: vitals reviewed and listed above, alert, oriented, appears well hydrated and in no acute distress HEENT: atraumatic, conjunctiva  clear, no obvious abnormalities on inspection of external nose and ears  NECK: no obvious masses on inspection palpation  LUNGS: clear to auscultation bilaterally, no wheezes, rales or rhonchi, good air movement CV: HRRR, no clubbing cyanosis or  peripheral edema nl cap refill  Abdomen:  Sof,t normal bowel sounds without hepatosplenomegaly, no guarding rebound or masses no CVA tendernes MS: moves all extremities without noticeable focal  abnormality PSYCH: pleasant and cooperative,  Lab Results  Component Value Date   WBC 8.8 11/09/2014   HGB 13.8 11/09/2014   HCT 41.1 11/09/2014   PLT 374.0 11/09/2014   GLUCOSE 105 (H) 02/02/2016   CHOL 244 (H) 05/07/2016   TRIG  200.0 (H) 05/07/2016   HDL 41.70 05/07/2016   LDLDIRECT 137.0 11/09/2014   LDLCALC 162 (H) 05/07/2016   ALT 20 11/09/2014   AST 21 11/09/2014   NA 138 02/02/2016   K 4.6 02/02/2016   CL 101 02/02/2016   CREATININE 0.84 02/02/2016   BUN 11 02/02/2016   CO2 29 02/02/2016   TSH 0.93 11/09/2014   HGBA1C 5.9 05/07/2016   ua 3+ blood  Neg leuk  ASSESSMENT AND PLAN:  Discussed the following assessment and plan:  Hematuria, unspecified type - Plan: POCT Urinalysis Dipstick (Automated), Urine Microscopic Only  NEPHROLITHIASIS, HX OF History of renal stone last scan 2014 had intrarenal stones. Her symptoms are reminiscent of that time however no pain today but does have hematuria. Strainer given today plan follow-up with PCP consider imaging. She has not been to a urologist in years and has been so long since she needed that. But she does have a history of stone extraction I believe when she was in [place]. Her periods are slightly irregular but not related to today. Declined any further pain medicines that she usually takes an anti-inflammatory  If needed . Disc   w dr Lenna Sciara and plan for fu with no imaging today  To come back for alarm sx before fu fever etc .  May need imaging studies  -Patient advised to return or notify health care team  if symptoms worsen ,persist or new concerns arise.  Patient Instructions  Certainly  Could be a kidney stone  Based on hx and urinalysis  And not infection.   considier  Getting a scan if  persistent or progressive  And fu needed  Strain  Your urine.   To catch   stone or gravel possible.    If continuing and no stone   Advise imagine study  Such as ct  Or even ultrasound ( not as good   To define  Problems)  .   Plan rov  With Dr   Martinique in   1-2 weeks  Or as needed .        Standley Brooking. Jovonta Levit M.D.

## 2017-01-29 DIAGNOSIS — Z803 Family history of malignant neoplasm of breast: Secondary | ICD-10-CM | POA: Diagnosis not present

## 2017-01-29 DIAGNOSIS — Z1231 Encounter for screening mammogram for malignant neoplasm of breast: Secondary | ICD-10-CM | POA: Diagnosis not present

## 2017-01-29 LAB — URINE CULTURE

## 2017-01-30 NOTE — Progress Notes (Signed)
Tell pt urine culture showed multiple bacteria but not predominant germ. Cannot tell if she has  UTI. Keep her follow up with dr Martinique    Can recollect  Urine culture  Good clean catch  At her convenience

## 2017-02-10 NOTE — Progress Notes (Signed)
HPI:   Angela Harrell is a 41 y.o. female, who is here today to follow on recent acute OV.   She was seen on 01/28/17 by Dr Regis Bill  "Pink urine",lower abdominal and lower back pain.  Hx of nephrolithiasis.   UCx 01/28/17:  Organism ID, Bacteria Three or more organisms present,each greater than  10,000 CFU/mL.These organisms,commonly found on  external and internal genitalia,are considered to  be colonizers.No further testing performed.     She is not sure if she had some "sand" in strainer. Urine "better in color", for the past couple days , and abdominal pain better today.  She is trying to drinking more fluids, water mainly.  Denies dysuria,increased urinary frequency, or decreased urine output.   "Pinching" pain LLQ, mild, not related with urination. She saw some "particles" in urine.  Nausea and episodes of diarrhea during the night that wakes her up. This is not new, she has had these symptoms since 10/2016. It was "bad" in 10/2016 , has had symptoms 2 times since 11/2016. Not sure if these is related to abdominal pain or "pink urine."  + Fatigue. + Acid reflux, heartburn.   She was following with urologists in [place].  Also mentions 2 weeks of occipital. Pressure like headache, "bad enough that is bothersome" No associated vomiting, numbness, or visual changes.   Review of Systems  Constitutional: Positive for fatigue. Negative for activity change, appetite change, chills and fever.  HENT: Negative for facial swelling, mouth sores, nosebleeds, postnasal drip, sore throat and trouble swallowing.   Eyes: Negative for redness and visual disturbance.  Respiratory: Negative for cough, shortness of breath and wheezing.   Cardiovascular: Negative for chest pain, palpitations and leg swelling.  Gastrointestinal: Positive for abdominal pain, diarrhea and nausea. Negative for blood in stool and vomiting.  Endocrine: Negative for cold intolerance and heat  intolerance.  Genitourinary: Positive for hematuria. Negative for decreased urine volume, dysuria and frequency.  Musculoskeletal: Negative for gait problem and myalgias.  Skin: Negative for pallor and rash.  Neurological: Positive for headaches. Negative for syncope and weakness.  Hematological: Negative for adenopathy. Does not bruise/bleed easily.  Psychiatric/Behavioral: Positive for sleep disturbance. Negative for confusion. The patient is nervous/anxious.       Current Outpatient Prescriptions on File Prior to Visit  Medication Sig Dispense Refill  . albuterol (PROVENTIL HFA) 108 (90 Base) MCG/ACT inhaler Inhale 2 puffs into the lungs every 6 (six) hours as needed for wheezing or shortness of breath. 1 Inhaler 3  . cetirizine (ZYRTEC) 10 MG tablet Take 10 mg by mouth daily.      . Cholecalciferol (VITAMIN D3 PO) Take 1 tablet by mouth daily.    . LUTEIN PO Take 1 capsule by mouth daily.    . Multiple Vitamins-Minerals (MULTIVITAMIN WITH MINERALS) tablet Take 1 tablet by mouth daily.    . Omega-3 Fatty Acids (FISH OIL) 1000 MG CAPS Take by mouth.    . Probiotic Product (PROBIOTIC PO) Take 1 tablet by mouth every other day.    . PSYLLIUM HUSK PO Take 2 capsules by mouth daily.    Marland Kitchen SPIRULINA PO Take 1 tablet by mouth daily. For food allergy prevention    . TURMERIC PO Take by mouth.     No current facility-administered medications on file prior to visit.      Past Medical History:  Diagnosis Date  . ALLERGIC RHINITIS 05/31/2008  . Anemia   . ANXIETY 05/31/2008  . ASTHMA 05/31/2008  .  CHOLELITHIASIS 05/31/2008  . High blood pressure    readings  . HYPERLIPIDEMIA 05/31/2008  . Kidney stone   . NEPHROLITHIASIS, HX OF 05/31/2008   Allergies  Allergen Reactions  . Prochlorperazine Edisylate Shortness Of Breath    Compazine Tongue swelling  . Banana Itching and Nausea And Vomiting  . Latex Hives  . Morphine And Related Itching  . Other     Tree Nuts (can have peanuts and  cashews) Pyrethrine (ingredient in lice products)  . Oxycodone-Acetaminophen Nausea And Vomiting    Social History   Social History  . Marital status: Married    Spouse name: N/A  . Number of children: 3  . 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 Smoking status: Never Smoker  . Smokeless tobacco: Never Used  . Alcohol use No  . Drug use: No  . Sexual activity: Yes    Birth control/ protection: Surgical   Other Topics Concern  . None   Social History Narrative   Lives with husband & 3 kids, work at home, PT outside of home. Often volunteers.    Vitals:   02/11/17 0928  BP: 110/80  Pulse: 83  Resp: 12  SpO2: 99%   Body mass index is 33.3 kg/m.   Physical Exam  Nursing note and vitals reviewed. Constitutional: She is oriented to person, place, and time. She appears well-developed. No distress.  HENT:  Head: Normocephalic and atraumatic.  Mouth/Throat: Oropharynx is clear and moist and mucous membranes are normal.  Eyes: Pupils are equal, round, and reactive to light. Conjunctivae and EOM are normal.  Cardiovascular: Normal rate and regular rhythm.   No murmur heard. Respiratory: Effort normal and breath sounds normal. No respiratory distress.  GI: Soft. Bowel sounds are normal. She exhibits no mass. There is no hepatomegaly. There is tenderness in the left upper quadrant and left lower quadrant. There is no rigidity, no rebound, no guarding and no CVA tenderness.    Musculoskeletal: She exhibits no edema.       Cervical back: She exhibits normal range of motion, no tenderness and no bony tenderness.  Lymphadenopathy:    She has no cervical adenopathy.  Neurological: She is alert and oriented to person, place, and time. She has normal strength. No cranial nerve deficit. Coordination and gait normal.  Skin: Skin is warm. No rash noted. No erythema.  Psychiatric: Her mood appears anxious. Her  affect is labile.  Well groomed, good eye contact.     ASSESSMENT AND PLAN:   Angela Harrell was seen today for follow-up.  Diagnoses and all orders for this visit:  Lab Results  Component Value Date   CREATININE 0.74 02/11/2017   BUN 13 02/11/2017   NA 137 02/11/2017   K 4.3 02/11/2017   CL 102 02/11/2017   CO2 28 02/11/2017   Lab Results  Component Value Date   WBC 8.5 02/11/2017   HGB 14.0 02/11/2017   HCT 42.4 02/11/2017   MCV 87.8 02/11/2017   PLT 352.0 02/11/2017    Hematuria, unspecified type  Seems improved.  We discussed possible etiologies. Further recommendations will be given according to labs/imaging results.  -     POCT urinalysis dipstick -     CBC -     Urine Microscopic Only -     CT RENAL STONE STUDY; Future  Nephrolithiasis  Some of symptoms she is reporting could be  related to this problem. Interrogation is difficult. She is a very poor historian, most answers to questions: "I do not know" Sine she is still having some flank discomfort abdominal CT will be arranged. Continued fluid intake and straining urine. Instructed about warning signs.  -     CT RENAL STONE STUDY; Future  Gastroesophageal reflux disease, esophagitis presence not specified  GERD precautions discussed. Initially reluctant to try pharmacologic treatment. After discussion of some side effects and benefits from med she agrees with trying. F/U in 6 weeks.  -     omeprazole (PRILOSEC) 20 MG capsule; Take 1 capsule (20 mg total) by mouth daily before breakfast.  Nausea without vomiting  ? Related to nephrolithiasis or GERD. Symptomatic treatment with Zofran. Instructed about warning signs. F/U as needed.  -     Basic metabolic panel -     ondansetron (ZOFRAN) 4 MG tablet; Take 1 tablet (4 mg total) by mouth every 8 (eight) hours as needed for nausea or vomiting.  Left flank pain -     CT RENAL STONE STUDY; Future  Headache, unspecified headache type  ? Tension like  headache. May improved with a better sleep. F/U in 6 weeks. Instructed about warning signs.       [name]. Martinique, MD  [facility]. [place] office.

## 2017-02-11 ENCOUNTER — Ambulatory Visit (INDEPENDENT_AMBULATORY_CARE_PROVIDER_SITE_OTHER): Payer: BLUE CROSS/BLUE SHIELD | Admitting: Family Medicine

## 2017-02-11 ENCOUNTER — Encounter: Payer: Self-pay | Admitting: Family Medicine

## 2017-02-11 VITALS — BP 110/80 | HR 83 | Resp 12 | Ht 63.0 in | Wt 188.0 lb

## 2017-02-11 DIAGNOSIS — N200 Calculus of kidney: Secondary | ICD-10-CM | POA: Diagnosis not present

## 2017-02-11 DIAGNOSIS — R109 Unspecified abdominal pain: Secondary | ICD-10-CM

## 2017-02-11 DIAGNOSIS — R110 Nausea: Secondary | ICD-10-CM | POA: Diagnosis not present

## 2017-02-11 DIAGNOSIS — K219 Gastro-esophageal reflux disease without esophagitis: Secondary | ICD-10-CM | POA: Diagnosis not present

## 2017-02-11 DIAGNOSIS — R519 Headache, unspecified: Secondary | ICD-10-CM

## 2017-02-11 DIAGNOSIS — R51 Headache: Secondary | ICD-10-CM | POA: Diagnosis not present

## 2017-02-11 DIAGNOSIS — R319 Hematuria, unspecified: Secondary | ICD-10-CM

## 2017-02-11 DIAGNOSIS — R10A2 Flank pain, left side: Secondary | ICD-10-CM

## 2017-02-11 LAB — CBC
HCT: 42.4 % (ref 36.0–46.0)
Hemoglobin: 14 g/dL (ref 12.0–15.0)
MCHC: 32.9 g/dL (ref 30.0–36.0)
MCV: 87.8 fl (ref 78.0–100.0)
PLATELETS: 352 10*3/uL (ref 150.0–400.0)
RBC: 4.83 Mil/uL (ref 3.87–5.11)
RDW: 13.5 % (ref 11.5–15.5)
WBC: 8.5 10*3/uL (ref 4.0–10.5)

## 2017-02-11 LAB — BASIC METABOLIC PANEL
BUN: 13 mg/dL (ref 6–23)
CO2: 28 meq/L (ref 19–32)
Calcium: 9.8 mg/dL (ref 8.4–10.5)
Chloride: 102 mEq/L (ref 96–112)
Creatinine, Ser: 0.74 mg/dL (ref 0.40–1.20)
GFR: 92.27 mL/min (ref >60.00)
GLUCOSE: 100 mg/dL — AB (ref 70–99)
POTASSIUM: 4.3 meq/L (ref 3.5–5.1)
Sodium: 137 mEq/L (ref 135–145)

## 2017-02-11 LAB — POCT URINALYSIS DIPSTICK
CLARITY UA: NEGATIVE
Glucose, UA: NEGATIVE
KETONES UA: NEGATIVE
LEUKOCYTES UA: NEGATIVE
NITRITE UA: NEGATIVE
PH UA: 6 (ref 5.0–8.0)
PROTEIN UA: NEGATIVE
Spec Grav, UA: <=1.005 — AB (ref 1.010–1.025)
Urobilinogen, UA: 0.2 E.U./dL

## 2017-02-11 LAB — URINALYSIS, MICROSCOPIC ONLY

## 2017-02-11 MED ORDER — ONDANSETRON HCL 4 MG PO TABS: 4.0000 mg | ORAL_TABLET | Freq: Three times a day (TID) | ORAL | 0 refills | Status: AC | PRN

## 2017-02-11 MED ORDER — OMEPRAZOLE 20 MG PO CPDR: 20.0000 mg | DELAYED_RELEASE_CAPSULE | Freq: Every day | ORAL | 1 refills | Status: DC

## 2017-02-11 NOTE — Patient Instructions (Signed)
A few things to remember from today's visit:   Hematuria, unspecified type - Plan: POCT urinalysis dipstick, CBC, Urine Microscopic Only, CT RENAL STONE STUDY  NEPHROLITHIASIS, HX OF - Plan: CT RENAL STONE STUDY  Gastroesophageal reflux disease, esophagitis presence not specified - Plan: omeprazole (PRILOSEC) 20 MG capsule  Nausea without vomiting - Plan: Basic metabolic panel, ondansetron (ZOFRAN) 4 MG tablet  Left flank pain - Plan: CT RENAL STONE STUDY    Avoid foods that make your symptoms worse, for example coffee, chocolate,pepermeint,alcohol, and greasy food. Raising the head of your bed about 6 inches may help with nocturnal symptoms.  Avoid tobacco use. Weight loss (if you are overweight). Avoid lying down for 3 hours after eating.  Instead 3 large meals daily try small and more frequent meals during the day.  Every medication have side effects and medications for GERD are not the exception.At this time I think benefit is greater than risk.    You should be evaluated immediately if bloody vomiting, bloody stools, black stools (like tar), difficulty swallowing, food gets stuck on the way down or choking when eating. Abnormal weight loss or severe abdominal pain.  If symptoms are not resolved sometimes endoscopy is necessary.  Please be sure medication list is accurate. If a new problem present, please set up appointment sooner than planned today.

## 2017-02-12 ENCOUNTER — Inpatient Hospital Stay: Admission: RE | Admit: 2017-02-12 | Payer: BLUE CROSS/BLUE SHIELD | Source: Ambulatory Visit

## 2017-02-12 ENCOUNTER — Ambulatory Visit (INDEPENDENT_AMBULATORY_CARE_PROVIDER_SITE_OTHER)
Admission: RE | Admit: 2017-02-12 | Discharge: 2017-02-12 | Disposition: A | Discharge status: Home / Self Care | Payer: BLUE CROSS/BLUE SHIELD | Source: Ambulatory Visit | Attending: Family Medicine | Admitting: Family Medicine

## 2017-02-12 DIAGNOSIS — Z87442 Personal history of urinary calculi: Secondary | ICD-10-CM

## 2017-02-12 DIAGNOSIS — R109 Unspecified abdominal pain: Secondary | ICD-10-CM

## 2017-02-12 DIAGNOSIS — R319 Hematuria, unspecified: Secondary | ICD-10-CM

## 2017-02-15 ENCOUNTER — Encounter: Payer: Self-pay | Admitting: Family Medicine

## 2017-02-15 ENCOUNTER — Other Ambulatory Visit: Payer: Self-pay | Admitting: Family Medicine

## 2017-02-15 DIAGNOSIS — N200 Calculus of kidney: Secondary | ICD-10-CM

## 2017-02-20 DIAGNOSIS — N201 Calculus of ureter: Secondary | ICD-10-CM | POA: Diagnosis not present

## 2017-02-25 DIAGNOSIS — Z6833 Body mass index (BMI) 33.0-33.9, adult: Secondary | ICD-10-CM | POA: Diagnosis not present

## 2017-02-25 DIAGNOSIS — Z01419 Encounter for gynecological examination (general) (routine) without abnormal findings: Secondary | ICD-10-CM | POA: Diagnosis not present

## 2017-02-27 DIAGNOSIS — N201 Calculus of ureter: Secondary | ICD-10-CM | POA: Diagnosis not present

## 2017-03-13 DIAGNOSIS — N201 Calculus of ureter: Secondary | ICD-10-CM | POA: Diagnosis not present

## 2017-03-20 ENCOUNTER — Encounter: Payer: Self-pay | Admitting: Family Medicine

## 2017-03-20 DIAGNOSIS — D2271 Melanocytic nevi of right lower limb, including hip: Secondary | ICD-10-CM | POA: Diagnosis not present

## 2017-03-20 DIAGNOSIS — D2261 Melanocytic nevi of right upper limb, including shoulder: Secondary | ICD-10-CM | POA: Diagnosis not present

## 2017-03-20 DIAGNOSIS — D225 Melanocytic nevi of trunk: Secondary | ICD-10-CM | POA: Diagnosis not present

## 2017-03-20 DIAGNOSIS — D2272 Melanocytic nevi of left lower limb, including hip: Secondary | ICD-10-CM | POA: Diagnosis not present

## 2017-03-30 ENCOUNTER — Emergency Department (HOSPITAL_COMMUNITY): Payer: BLUE CROSS/BLUE SHIELD

## 2017-03-30 ENCOUNTER — Encounter (HOSPITAL_COMMUNITY): Payer: Self-pay | Admitting: Emergency Medicine

## 2017-03-30 ENCOUNTER — Emergency Department (HOSPITAL_COMMUNITY)
Admission: EM | Admit: 2017-03-30 | Discharge: 2017-03-30 | Disposition: A | Discharge status: Home / Self Care | Payer: BLUE CROSS/BLUE SHIELD | Attending: Emergency Medicine | Admitting: Emergency Medicine

## 2017-03-30 DIAGNOSIS — R112 Nausea with vomiting, unspecified: Secondary | ICD-10-CM | POA: Diagnosis not present

## 2017-03-30 DIAGNOSIS — J45909 Unspecified asthma, uncomplicated: Secondary | ICD-10-CM | POA: Diagnosis not present

## 2017-03-30 DIAGNOSIS — N179 Acute kidney failure, unspecified: Secondary | ICD-10-CM | POA: Diagnosis not present

## 2017-03-30 DIAGNOSIS — R109 Unspecified abdominal pain: Secondary | ICD-10-CM | POA: Diagnosis not present

## 2017-03-30 DIAGNOSIS — N200 Calculus of kidney: Secondary | ICD-10-CM | POA: Insufficient documentation

## 2017-03-30 DIAGNOSIS — R1031 Right lower quadrant pain: Secondary | ICD-10-CM | POA: Diagnosis not present

## 2017-03-30 DIAGNOSIS — Z91040 Latex allergy status: Secondary | ICD-10-CM | POA: Insufficient documentation

## 2017-03-30 LAB — BASIC METABOLIC PANEL
ANION GAP: 12 (ref 5–15)
BUN: 17 mg/dL (ref 6–20)
CHLORIDE: 101 mmol/L (ref 101–111)
CO2: 22 mmol/L (ref 22–32)
Calcium: 9.5 mg/dL (ref 8.9–10.3)
Creatinine, Ser: 1.26 mg/dL — ABNORMAL HIGH (ref 0.44–1.00)
GFR calc Af Amer: >60 mL/min (ref >60)
GFR, EST NON AFRICAN AMERICAN: 53 mL/min — AB (ref >60)
Glucose, Bld: 151 mg/dL — ABNORMAL HIGH (ref 65–99)
POTASSIUM: 3.9 mmol/L (ref 3.5–5.1)
Sodium: 135 mmol/L (ref 135–145)

## 2017-03-30 LAB — CBC
HEMATOCRIT: 36.8 % (ref 36.0–46.0)
HEMOGLOBIN: 12.3 g/dL (ref 12.0–15.0)
MCH: 28.4 pg (ref 26.0–34.0)
MCHC: 33.4 g/dL (ref 30.0–36.0)
MCV: 85 fL (ref 78.0–100.0)
Platelets: 288 10*3/uL (ref 150–400)
RBC: 4.33 MIL/uL (ref 3.87–5.11)
RDW: 13.5 % (ref 11.5–15.5)
WBC: 15 10*3/uL — AB (ref 4.0–10.5)

## 2017-03-30 LAB — URINALYSIS, ROUTINE W REFLEX MICROSCOPIC
Bilirubin Urine: NEGATIVE
Glucose, UA: NEGATIVE mg/dL
Ketones, ur: NEGATIVE mg/dL
LEUKOCYTES UA: NEGATIVE
NITRITE: NEGATIVE
PH: 6 (ref 5.0–8.0)
Protein, ur: 100 mg/dL — AB
SPECIFIC GRAVITY, URINE: 1.031 — AB (ref 1.005–1.030)

## 2017-03-30 LAB — POC URINE PREG, ED: PREG TEST UR: NEGATIVE

## 2017-03-30 MED ORDER — TAMSULOSIN HCL 0.4 MG PO CAPS: 0.4000 mg | ORAL_CAPSULE | Freq: Every day | ORAL | 0 refills | Status: DC

## 2017-03-30 MED ORDER — HYDROCODONE-ACETAMINOPHEN 5-325 MG PO TABS: 0.5000 | ORAL_TABLET | Freq: Four times a day (QID) | ORAL | 0 refills | Status: DC | PRN

## 2017-03-30 MED ORDER — METOCLOPRAMIDE HCL 10 MG PO TABS: 10.0000 mg | ORAL_TABLET | Freq: Four times a day (QID) | ORAL | 0 refills | Status: DC | PRN

## 2017-03-30 MED ORDER — ONDANSETRON 4 MG PO TBDP
ORAL_TABLET | ORAL | Status: AC
  Filled 2017-03-30: qty 1

## 2017-03-30 MED ORDER — ONDANSETRON 4 MG PO TBDP
4.0000 mg | ORAL_TABLET | Freq: Once | ORAL | Status: AC | PRN
  Administered 2017-03-30: 4 mg via ORAL

## 2017-03-30 MED ORDER — KETOROLAC TROMETHAMINE 30 MG/ML IJ SOLN
30.0000 mg | Freq: Once | INTRAMUSCULAR | Status: AC
  Administered 2017-03-30: 30 mg via INTRAVENOUS
  Filled 2017-03-30: qty 1

## 2017-03-30 MED ORDER — TAMSULOSIN HCL 0.4 MG PO CAPS
0.4000 mg | ORAL_CAPSULE | Freq: Once | ORAL | Status: DC
  Filled 2017-03-30: qty 1

## 2017-03-30 MED ORDER — FENTANYL CITRATE (PF) 100 MCG/2ML IJ SOLN
50.0000 ug | Freq: Once | INTRAMUSCULAR | Status: AC
  Administered 2017-03-30: 50 ug via INTRAVENOUS
  Filled 2017-03-30: qty 2

## 2017-03-30 MED ORDER — IBUPROFEN 400 MG PO TABS: 400.0000 mg | ORAL_TABLET | Freq: Four times a day (QID) | ORAL | 0 refills | Status: DC | PRN

## 2017-03-30 MED ORDER — HYDROCODONE-ACETAMINOPHEN 5-325 MG PO TABS
0.5000 | ORAL_TABLET | Freq: Once | ORAL | Status: AC
  Administered 2017-03-30: 0.5 via ORAL
  Filled 2017-03-30: qty 1

## 2017-03-30 MED ORDER — METOCLOPRAMIDE HCL 5 MG/ML IJ SOLN
10.0000 mg | Freq: Once | INTRAMUSCULAR | Status: AC
  Administered 2017-03-30: 10 mg via INTRAVENOUS
  Filled 2017-03-30: qty 2

## 2017-03-30 MED ORDER — SODIUM CHLORIDE 0.9 % IV BOLUS (SEPSIS)
1000.0000 mL | Freq: Once | INTRAVENOUS | Status: AC
  Administered 2017-03-30: 1000 mL via INTRAVENOUS

## 2017-03-30 NOTE — ED Triage Notes (Signed)
Reports right flank pain that started last night.  Hx of kidney stones.  Frequency and urgency reported with urination and n/v.

## 2017-03-30 NOTE — ED Provider Notes (Signed)
Emergency Department Provider Note   I have reviewed the triage vital signs and the nursing notes.   HISTORY  Chief Complaint Abdominal Pain   HPI Angela Harrell is a 41 y.o. female with a past medical history of cholelithiasis status post cholecystectomy, kidney stones requiring intervention, hyperlipidemia and hypertension presents to the emergency department today secondary to right lower quadrant abdominal pain. Patient states that it's a sharp stabbing pain in her right lower quadrant and that doesn't seem to radiate anywhere. She's had multiple episodes of vomiting of which she is unsure if it was bloody or what color. No fevers or new GI symptoms otherwise. No back pain or chest pain aside from when she was throwing up.Tried Aleve without improvement. Tried Zofran without improvement. Has had some change in the color of her urine and urinary frequency. No dysuria. No other associated or modifying symptoms. States this is similar to kidney stones in the past. Still has her appendix.   Past Medical History:  Diagnosis Date  . ALLERGIC RHINITIS 05/31/2008  . Anemia   . ANXIETY 05/31/2008  . ASTHMA 05/31/2008  . CHOLELITHIASIS 05/31/2008  . High blood pressure    readings  . HYPERLIPIDEMIA 05/31/2008  . Kidney stone   . NEPHROLITHIASIS, HX OF 05/31/2008    Patient Active Problem List   Diagnosis Date Noted  . GERD (gastroesophageal reflux disease) 02/11/2017  . Hyperlipemia, mixed 05/07/2016  . Vitamin D deficiency 02/02/2016  . Breast cancer screening 11/09/2014  . BMI 33.0-33.9,adult 11/09/2014  . Left-sided low back pain without sciatica 11/09/2014  . Tearfulness 11/09/2014  . Dyslipidemia (high LDL; low HDL) 05/31/2008  . ANXIETY 05/31/2008  . ALLERGIC RHINITIS 05/31/2008  . Asthma, mild intermittent 05/31/2008  . Bilateral nephrolithiasis 05/31/2008    Past Surgical History:  Procedure Laterality Date  . CESAREAN SECTION  [phone]  . CHOLECYSTECTOMY  1999    . KIDNEY STONE SURGERY    . ORIF CONGENITAL HIP DISLOCATION      Current Outpatient Rx  . Order #: [id number] Class: Normal  . Order #: [id number] Class: Historical Med  . Order #: [id number] Class: Historical Med  . Order #: [id number] Class: Print  . Order #: [id number] Class: Print  . Order #: [id number] Class: Historical Med  . Order #: [id number] Class: Print  . Order #: [id number] Class: Historical Med  . Order #: [id number] Class: Historical Med  . Order #: [id number] Class: Normal  . Order #: [id number] Class: Historical Med  . Order #: [id number] Class: Historical Med  . Order #: [id number] Class: Historical Med  . Order #: [id number] Class: Print  . Order #: [id number] Class: Historical Med    Allergies Prochlorperazine edisylate; Banana; Latex; Morphine and related; Other; and Oxycodone-acetaminophen  Family History  Problem Relation Age of Onset  . Breast cancer Mother 73  . Arthritis Mother   . Hyperlipidemia Father        triglycerides  . Mental illness Brother        bipolar  . Asthma Brother   . Allergic Disorder Daughter   . Cancer Maternal Uncle        mult myeloma  . Vascular Disease Maternal Uncle   . Heart disease Paternal Grandfather     Social History Social History  Substance Use Topics  . Smoking status: Never Smoker  . Smokeless tobacco: Never Used  . Alcohol use No    Review of Systems  All other systems negative except as documented in the HPI. All pertinent positives and negatives as reviewed in the HPI. ____________________________________________  PHYSICAL EXAM:  VITAL SIGNS: ED Triage Vitals  Enc Vitals Group     BP 03/30/17 0538 (!) 142/90     Pulse Rate 03/30/17 0538 (!) 58     Resp 03/30/17 0538 18     Temp 03/30/17 0538 99.1 F (37.3 C)     Temp Source 03/30/17 0538 Oral     SpO2 03/30/17 0538 98 %     Weight 03/30/17 0539 185 lb (83.9 kg)     Height 03/30/17 0539 5\' 3"  (1.6 m)     Head Circumference --      Peak Flow --      Pain Score 03/30/17  0538 4     Pain Loc --      Pain Edu? --      Excl. in [place]? --     Constitutional: Alert and oriented. Well appearing and in no acute distress. Eyes: Conjunctivae are normal. PERRL. EOMI. Head: Atraumatic. Nose: No congestion/rhinnorhea. Mouth/Throat: Mucous membranes are moist.  Oropharynx non-erythematous. Neck: No stridor.  No meningeal signs.   Cardiovascular: Normal rate, regular rhythm. Good peripheral circulation. Grossly normal heart sounds.   Respiratory: Normal respiratory effort.  No retractions. Lungs CTAB. Gastrointestinal: Soft and nontender without any evidence of peritonitis, no rash. No distention.  Musculoskeletal: No lower extremity tenderness nor edema. No gross deformities of extremities. Neurologic:  Normal speech and language. No gross focal neurologic deficits are appreciated.  Skin:  Skin is warm, dry and intact. No rash noted.   ____________________________________________   LABS (all labs ordered are listed, but only abnormal results are displayed)  Labs Reviewed  URINALYSIS, ROUTINE W REFLEX MICROSCOPIC - Abnormal; Notable for the following:       Result Value   APPearance HAZY (*)    Specific Gravity, Urine 1.031 (*)    Hgb urine dipstick MODERATE (*)    Protein, ur 100 (*)    Bacteria, UA RARE (*)    Squamous Epithelial / LPF 6-30 (*)    All other components within normal limits  BASIC METABOLIC PANEL - Abnormal; Notable for the following:    Glucose, Bld 151 (*)    Creatinine, Ser 1.26 (*)    GFR calc non Af Amer 53 (*)    All other components within normal limits  CBC - Abnormal; Notable for the following:    WBC 15.0 (*)    All other components within normal limits  URINE CULTURE  HEPATIC FUNCTION PANEL  POC URINE PREG, ED   ____________________________________________  RADIOLOGY  Ct Renal Stone Study  Result Date: 03/30/2017 CLINICAL DATA:  Right flank pain for 1 day. EXAM: CT ABDOMEN AND PELVIS WITHOUT CONTRAST TECHNIQUE:  Multidetector CT imaging of the abdomen and pelvis was performed following the standard protocol without IV contrast. COMPARISON:  One view abdomen 03/13/2017. abdominopelvic CT 02/12/2017. FINDINGS: Lower chest: Clear lung bases. No significant pleural or pericardial effusion. Hepatobiliary: The liver demonstrates diffusely decreased density consistent with steatosis. No focal lesions are identified on noncontrast imaging. No significant biliary dilatation status post cholecystectomy. Pancreas: Unremarkable. No pancreatic ductal dilatation or surrounding inflammatory changes. Spleen: Normal in size without focal abnormality. Adrenals/Urinary Tract: Both adrenal glands appear normal. There is new asymmetric perinephric soft tissue stranding on the right associated with mild dilatation of the right renal collecting system and proximal ureter. There is an obstructing calculus in the proximal right ureter, measuring 6 mm on coronal image 52. This is located at the L2-3 disc space level and is visible  on the scout image. No other urinary tract calculi are demonstrated. The left kidney appears unremarkable. The bladder appears unremarkable. Stomach/Bowel: No evidence of bowel wall thickening, distention or surrounding inflammatory change. Small hiatal hernia and mild colonic diverticulosis noted. The appendix appears normal. Vascular/Lymphatic: There are no enlarged abdominal or pelvic lymph nodes. No significant vascular findings on noncontrast imaging. Reproductive: The uterus and adnexae appear unremarkable. Other: Diastasis of the rectus abdominus muscles with a broad-based periumbilical hernia containing only fat, stable. No ascites. Musculoskeletal: No acute or significant osseous findings. IMPRESSION: 1. Obstructing 6 mm calculus in the proximal right ureter with associated hydronephrosis and perinephric soft tissue stranding. 2. No other urinary tract calculi. 3. Hepatic steatosis. Electronically Signed   By:  Richardean Sale M.D.   On: 03/30/2017 09:42    ____________________________________________   PROCEDURES  Procedure(s) performed:   Procedures   ____________________________________________   INITIAL IMPRESSION / ASSESSMENT AND PLAN / ED COURSE  Pertinent labs & imaging results that were available during my care of the patient were reviewed by me and considered in my medical decision making (see chart for details).  6 mm stone in the proximal ureter with associated hydronephrosis and mild AK I without obvious infection. Does have a white blood cell count is elevated so we'll send a urine culture to ensure is not from the urine. I discussed with Dr. Tresa Moore with urology who recommends outpatient follow-up as long as her pain and nausea are controlled.  ____________________________________________  FINAL CLINICAL IMPRESSION(S) / ED DIAGNOSES  Final diagnoses:  Kidney stone  Non-intractable vomiting with nausea, unspecified vomiting type  AKI (acute kidney injury) ([place])     MEDICATIONS GIVEN DURING THIS VISIT:  Medications  ondansetron (ZOFRAN-ODT) 4 MG disintegrating tablet (not administered)  tamsulosin (FLOMAX) capsule 0.4 mg (0.4 mg Oral Refused 03/30/17 1022)  HYDROcodone-acetaminophen (NORCO/VICODIN) 5-325 MG per tablet 0.5 tablet (not administered)  ondansetron (ZOFRAN-ODT) disintegrating tablet 4 mg (4 mg Oral Given 03/30/17 0545)  metoCLOPramide (REGLAN) injection 10 mg (10 mg Intravenous Given 03/30/17 0851)  sodium chloride 0.9 % bolus 1,000 mL (1,000 mLs Intravenous New Bag/Given 03/30/17 0853)  fentaNYL (SUBLIMAZE) injection 50 mcg (50 mcg Intravenous Given 03/30/17 0853)  ketorolac (TORADOL) 30 MG/ML injection 30 mg (30 mg Intravenous Given 03/30/17 1022)     NEW OUTPATIENT MEDICATIONS STARTED DURING THIS VISIT:  New Prescriptions   HYDROCODONE-ACETAMINOPHEN (NORCO/VICODIN) 5-325 MG TABLET    Take 0.5-1 tablets by mouth every 6 (six) hours as needed for severe  pain.   IBUPROFEN (ADVIL,MOTRIN) 400 MG TABLET    Take 1 tablet (400 mg total) by mouth every 6 (six) hours as needed.   METOCLOPRAMIDE (REGLAN) 10 MG TABLET    Take 1 tablet (10 mg total) by mouth every 6 (six) hours as needed for nausea (nausea/headache).   TAMSULOSIN (FLOMAX) 0.4 MG CAPS CAPSULE    Take 1 capsule (0.4 mg total) by mouth daily.    Note:  This document was prepared using Dragon voice recognition software and may include unintentional dictation errors.   Merrily Pew, MD 03/31/17 [phone]

## 2017-03-30 NOTE — ED Notes (Signed)
Patient transported to CT 

## 2017-03-31 DIAGNOSIS — N201 Calculus of ureter: Secondary | ICD-10-CM | POA: Diagnosis not present

## 2017-03-31 LAB — URINE CULTURE

## 2017-04-01 ENCOUNTER — Other Ambulatory Visit: Payer: Self-pay | Admitting: Urology

## 2017-04-02 ENCOUNTER — Encounter (HOSPITAL_COMMUNITY): Payer: Self-pay | Admitting: General Practice

## 2017-04-02 NOTE — Progress Notes (Signed)
Patient called and stated" My insurance won't cover Angela Harrell, so I'm not coming tomorrow". Patient instructed to call [facility] in the morning and notify them.

## 2017-04-03 ENCOUNTER — Ambulatory Visit (HOSPITAL_COMMUNITY): Admission: RE | Admit: 2017-04-03 | Payer: BLUE CROSS/BLUE SHIELD | Source: Ambulatory Visit | Admitting: Urology

## 2017-04-03 HISTORY — DX: Other specified postprocedural states: Z98.890

## 2017-04-03 HISTORY — DX: Unspecified asthma, uncomplicated: J45.909

## 2017-04-03 HISTORY — DX: Personal history of urinary calculi: Z87.442

## 2017-04-03 SURGERY — LITHOTRIPSY, ESWL
Anesthesia: LOCAL | Laterality: Right

## 2017-04-10 DIAGNOSIS — N201 Calculus of ureter: Secondary | ICD-10-CM | POA: Diagnosis not present

## 2017-04-24 DIAGNOSIS — N201 Calculus of ureter: Secondary | ICD-10-CM | POA: Diagnosis not present

## 2017-05-06 NOTE — Progress Notes (Signed)
HPI:   [name] is a 41 y.o. female, who is here today to follow on some chronic medical problems.  I saw her last on February 11, 2018 due to gross hematuria.   Since her last office visit she has been evaluated by urology (Dr. Karsten Harrell), she underwent right extracorporeal lithotripsy. Last renal function test mildly abnormal (03/30/2017).She denies foam in urine,gross hematuria,or decreased urine output. No dysuria or fever.  Hyperlipidemia:  Currently on nonpharmacologic treatment, she has not been interested in trying medication. Following a low fat diet: Not consistently since 12/2016.   Lab Results  Component Value Date   CHOL 244 (H) 05/07/2016   HDL 41.70 05/07/2016   LDLCALC 162 (H) 05/07/2016   LDLDIRECT 137.0 11/09/2014   TRIG 200.0 (H) 05/07/2016   CHOLHDL 6 05/07/2016    Obesity:  Dietary changes since last OV: She has not been consistent with a healthy diet due to recent episodes of nephrolithiasis. Exercise: Still doing the elliptical a couple times per week but not consistently.  She is trying to eat from 6 am to 6 pm and has noted some wt loss.   Vitamin D deficiency: Currently she is on OTC vitamin D supplementation 5000 U.    Review of Systems  Constitutional: Negative for activity change, appetite change, fatigue and fever.  HENT: Negative for mouth sores, nosebleeds and trouble swallowing.   Eyes: Negative for redness and visual disturbance.  Respiratory: Negative for cough, shortness of breath and wheezing.   Cardiovascular: Negative for chest pain, palpitations and leg swelling.  Gastrointestinal: Negative for abdominal pain, nausea and vomiting.       Negative for changes in bowel habits.  Genitourinary: Negative for decreased urine volume, difficulty urinating, dysuria and hematuria.  Musculoskeletal: Negative for gait problem and myalgias.  Neurological: Negative for syncope, weakness and headaches.  Psychiatric/Behavioral:  Negative for confusion. The patient is nervous/anxious.       Current Outpatient Medications on File Prior to Visit  Medication Sig Dispense Refill  . albuterol (PROVENTIL HFA) 108 (90 Base) MCG/ACT inhaler Inhale 2 puffs into the lungs every 6 (six) hours as needed for wheezing or shortness of breath. 1 Inhaler 3  . cetirizine (ZYRTEC) 10 MG tablet Take 10 mg by mouth daily.      . Cholecalciferol (VITAMIN D3 PO) Take 1 tablet by mouth daily.    . LUTEIN PO Take 1 capsule by mouth daily.    . metoCLOPramide (REGLAN) 10 MG tablet Take 1 tablet (10 mg total) by mouth every 6 (six) hours as needed for nausea (nausea/headache). 6 tablet 0  . Multiple Vitamins-Minerals (MULTIVITAMIN WITH MINERALS) tablet Take 1 tablet by mouth daily.    . Omega-3 Fatty Acids (FISH OIL) 1000 MG CAPS Take by mouth.    Marland Kitchen omeprazole (PRILOSEC) 20 MG capsule Take 1 capsule (20 mg total) by mouth daily before breakfast. 30 capsule 1  . Probiotic Product (PROBIOTIC PO) Take 1 tablet by mouth every other day.    . PSYLLIUM HUSK PO Take 2 capsules by mouth daily.    Marland Kitchen SPIRULINA PO Take 1 tablet by mouth daily. For food allergy prevention    . TURMERIC PO Take by mouth.     No current facility-administered medications on file prior to visit.      Past Medical History:  Diagnosis Date  . ALLERGIC RHINITIS 05/31/2008  . Anemia   . ANXIETY 05/31/2008  . ASTHMA 05/31/2008  . Asthma   .  CHOLELITHIASIS 05/31/2008  . High blood pressure    readings  . History of kidney stones   . History of right breast biopsy 2017  . HYPERLIPIDEMIA 05/31/2008  . Kidney stone   . NEPHROLITHIASIS, HX OF 05/31/2008   Allergies  Allergen Reactions  . Beef-Derived Products Diarrhea    Nausea and vomiting  . Prochlorperazine Edisylate Shortness Of Breath    Compazine Tongue swelling  . Banana Itching and Nausea And Vomiting  . Latex Hives  . Morphine And Related Itching  . Other     Tree Nuts (can have peanuts and  cashews) Pyrethrine (ingredient in lice products)- fever and rash  . Oxycodone-Acetaminophen Nausea And Vomiting    Social History   Socioeconomic History  . Marital status: Married    Spouse name: None  . Number of children: 3  . Years of education: BS  . Highest education level: None  Social Needs  . Financial resource strain: None  . Food insecurity - worry: None  . Food insecurity - inability: None  . Transportation needs - medical: None  . Transportation needs - non-medical: None  Occupational History  . Occupation: Soil scientist  . Occupation: Substitute  Tobacco Use  . Smoking status: Never Smoker  . Smokeless tobacco: Never Used  Substance and Sexual Activity  . Alcohol use: No  . Drug use: No  . Sexual activity: Yes    Birth control/protection: Surgical  Other Topics Concern  . None  Social History Narrative   Lives with husband & 3 kids, work at home, PT outside of home. Often volunteers.    Vitals:   05/07/17 0932  BP: 122/78  Pulse: 88  Resp: 12  Temp: 98.1 F (36.7 C)  SpO2: 99%   Body mass index is 32.33 kg/m.  Wt Readings from Last 3 Encounters:  05/07/17 182 lb 8 oz (82.8 kg)  03/30/17 185 lb (83.9 kg)  02/11/17 188 lb (85.3 kg)    Physical Exam  Nursing note and vitals reviewed. Constitutional: She is oriented to person, place, and time. She appears well-developed. No distress.  HENT:  Head: Normocephalic and atraumatic.  Mouth/Throat: Oropharynx is clear and moist. Mucous membranes are dry.  Eyes: Conjunctivae are normal. Pupils are equal, round, and reactive to light.  Neck: No tracheal deviation present. No thyroid mass and no thyromegaly present.  Cardiovascular: Normal rate and regular rhythm.  No murmur heard. Pulses:      Dorsalis pedis pulses are 2+ on the right side, and 2+ on the left side.  Respiratory: Effort normal and breath sounds normal. No respiratory distress.  GI: Soft. She exhibits no mass. There is no hepatomegaly.  There is no tenderness. There is no CVA tenderness.  Musculoskeletal: She exhibits no edema or tenderness.  Lymphadenopathy:    She has no cervical adenopathy.  Neurological: She is alert and oriented to person, place, and time. She has normal strength. Coordination and gait normal.  Skin: Skin is warm. No erythema.  Psychiatric: Her mood appears anxious.  Well groomed, good eye contact.    ASSESSMENT AND PLAN:   Ms. Angela Harrell was seen today for follow-up.  Diagnoses and all orders for this visit:  Lab Results  Component Value Date   CHOL 263 (H) 05/07/2017   HDL 36.00 (L) 05/07/2017   LDLCALC 162 (H) 05/07/2016   LDLDIRECT 156.0 05/07/2017   TRIG 323.0 (H) 05/07/2017   CHOLHDL 7 05/07/2017   Lab Results  Component Value Date   CREATININE 0.86  05/07/2017   BUN 13 05/07/2017   NA 137 05/07/2017   K 4.5 05/07/2017   CL 99 05/07/2017   CO2 30 05/07/2017    Hyperlipemia, mixed  Continue low fat diet. Benefits of statin meds discussed. If not any better or worse, pharmacologic treatment will be recommended. F/U in 6-12 months.  -     Lipid panel -     Basic metabolic panel  Class 1 obesity without serious comorbidity with body mass index (BMI) of 33.0 to 33.9 in adult, unspecified obesity type  We discussed benefits of wt loss as well as adverse effects of obesity. Consistency with healthy diet and physical activity recommended.  Vitamin D deficiency  No changes in current management, will follow labs done today and will give further recommendations accordingly.  -     VITAMIN D 25 Hydroxy (Vit-D Deficiency, Fractures)  Abnormal renal function test  Increase fluid intake. Caution with NSAID's Further recommendations will be given according to lab results.  -     Basic metabolic panel  Need for influenza vaccination -     Flu Vaccine QUAD 36+ mos IM  Bilateral nephrolithiasis  Currently asymptomatic. Increased fluid intake,continue straining  urine,instructed about warning signs. Keep f/u appt with urologist.     -Ms. Angela Harrell was advised to return sooner than planned today if new concerns arise.       [name]. Martinique, MD  [facility]. [place] office.

## 2017-05-07 ENCOUNTER — Encounter: Payer: Self-pay | Admitting: Family Medicine

## 2017-05-07 ENCOUNTER — Ambulatory Visit: Payer: BLUE CROSS/BLUE SHIELD | Admitting: Family Medicine

## 2017-05-07 VITALS — BP 122/78 | HR 88 | Temp 98.1°F | Resp 12 | Ht 63.0 in | Wt 182.5 lb

## 2017-05-07 DIAGNOSIS — E669 Obesity, unspecified: Secondary | ICD-10-CM

## 2017-05-07 DIAGNOSIS — N200 Calculus of kidney: Secondary | ICD-10-CM | POA: Diagnosis not present

## 2017-05-07 DIAGNOSIS — E559 Vitamin D deficiency, unspecified: Secondary | ICD-10-CM

## 2017-05-07 DIAGNOSIS — E782 Mixed hyperlipidemia: Secondary | ICD-10-CM | POA: Diagnosis not present

## 2017-05-07 DIAGNOSIS — Z23 Encounter for immunization: Secondary | ICD-10-CM

## 2017-05-07 DIAGNOSIS — R944 Abnormal results of kidney function studies: Secondary | ICD-10-CM

## 2017-05-07 DIAGNOSIS — Z6833 Body mass index (BMI) 33.0-33.9, adult: Secondary | ICD-10-CM

## 2017-05-07 LAB — LDL CHOLESTEROL, DIRECT: Direct LDL: 156 mg/dL

## 2017-05-07 LAB — BASIC METABOLIC PANEL
BUN: 13 mg/dL (ref 6–23)
CO2: 30 mEq/L (ref 19–32)
CREATININE: 0.86 mg/dL (ref 0.40–1.20)
Calcium: 10.3 mg/dL (ref 8.4–10.5)
Chloride: 99 mEq/L (ref 96–112)
GFR: 77.49 mL/min (ref >60.00)
GLUCOSE: 100 mg/dL — AB (ref 70–99)
POTASSIUM: 4.5 meq/L (ref 3.5–5.1)
Sodium: 137 mEq/L (ref 135–145)

## 2017-05-07 LAB — LIPID PANEL
CHOL/HDL RATIO: 7
CHOLESTEROL: 263 mg/dL — AB (ref 0–200)
HDL: 36 mg/dL — AB (ref >39.00)
NonHDL: 226.69
TRIGLYCERIDES: 323 mg/dL — AB (ref 0.0–149.0)
VLDL: 64.6 mg/dL — AB (ref 0.0–40.0)

## 2017-05-07 LAB — VITAMIN D 25 HYDROXY (VIT D DEFICIENCY, FRACTURES): VITD: 37.86 ng/mL (ref 30.00–100.00)

## 2017-05-07 NOTE — Patient Instructions (Signed)
A few things to remember from today's visit:   Hyperlipemia, mixed - Plan: Lipid panel, Basic metabolic panel  Class 1 obesity without serious comorbidity with body mass index (BMI) of 33.0 to 33.9 in adult, unspecified obesity type  Vitamin D deficiency - Plan: VITAMIN D 25 Hydroxy (Vit-D Deficiency, Fractures)  Abnormal renal function test - Plan: Basic metabolic panel   Fat and Cholesterol Restricted Diet Getting too much fat and cholesterol in your diet may cause health problems. Following this diet helps keep your fat and cholesterol at normal levels. This can keep you from getting sick. What types of fat should I choose?  Choose monosaturated and polyunsaturated fats. These are found in foods such as olive oil, canola oil, flaxseeds, walnuts, almonds, and seeds.  Eat more omega-3 fats. Good choices include salmon, mackerel, sardines, tuna, flaxseed oil, and ground flaxseeds.  Limit saturated fats. These are in animal products such as meats, butter, and cream. They can also be in plant products such as palm oil, palm kernel oil, and coconut oil.  Avoid foods with partially hydrogenated oils in them. These contain trans fats. Examples of foods that have trans fats are stick margarine, some tub margarines, cookies, crackers, and other baked goods. What general guidelines do I need to follow?  Check food labels. Look for the words "trans fat" and "saturated fat."  When preparing a meal: ? Fill half of your plate with vegetables and green salads. ? Fill one fourth of your plate with whole grains. Look for the word "whole" as the first word in the ingredient list. ? Fill one fourth of your plate with lean protein foods.  Eat more foods that have fiber, like apples, carrots, beans, peas, and barley.  Eat more home-cooked foods. Eat less at restaurants and buffets.  Limit or avoid alcohol.  Limit foods high in starch and sugar.  Limit fried foods.  Cook foods without frying  them. Baking, boiling, grilling, and broiling are all great options.  Lose weight if you are overweight. Losing even a small amount of weight can help your overall health. It can also help prevent diseases such as diabetes and heart disease. What foods can I eat? Grains Whole grains, such as whole wheat or whole grain breads, crackers, cereals, and pasta. Unsweetened oatmeal, bulgur, barley, quinoa, or brown rice. Corn or whole wheat flour tortillas. Vegetables Fresh or frozen vegetables (raw, steamed, roasted, or grilled). Green salads. Fruits All fresh, canned (in natural juice), or frozen fruits. Meat and Other Protein Products Ground beef (85% or leaner), grass-fed beef, or beef trimmed of fat. Skinless chicken or [place]. Ground chicken or [place]. Pork trimmed of fat. All fish and seafood. Eggs. Dried beans, peas, or lentils. Unsalted nuts or seeds. Unsalted canned or dry beans. Dairy Low-fat dairy products, such as skim or 1% milk, 2% or reduced-fat cheeses, low-fat ricotta or cottage cheese, or plain low-fat yogurt. Fats and Oils Tub margarines without trans fats. Light or reduced-fat mayonnaise and salad dressings. Avocado. Olive, canola, sesame, or safflower oils. Natural peanut or almond butter (choose ones without added sugar and oil). The items listed above may not be a complete list of recommended foods or beverages. Contact your dietitian for more options. What foods are not recommended? Grains White bread. White pasta. White rice. Cornbread. Bagels, pastries, and croissants. Crackers that contain trans fat. Vegetables White potatoes. Corn. Creamed or fried vegetables. Vegetables in a cheese sauce. Fruits Dried fruits. Canned fruit in light or heavy syrup. Fruit  juice. Meat and Other Protein Products Fatty cuts of meat. Ribs, chicken wings, bacon, sausage, bologna, salami, chitterlings, fatback, hot dogs, bratwurst, and packaged luncheon meats. Liver and organ  meats. Dairy Whole or 2% milk, cream, half-and-half, and cream cheese. Whole milk cheeses. Whole-fat or sweetened yogurt. Full-fat cheeses. Nondairy creamers and whipped toppings. Processed cheese, cheese spreads, or cheese curds. Sweets and Desserts Corn syrup, sugars, honey, and molasses. Candy. Jam and jelly. Syrup. Sweetened cereals. Cookies, pies, cakes, donuts, muffins, and ice cream. Fats and Oils Butter, stick margarine, lard, shortening, ghee, or bacon fat. Coconut, palm kernel, or palm oils. Beverages Alcohol. Sweetened drinks (such as sodas, lemonade, and fruit drinks or punches). The items listed above may not be a complete list of foods and beverages to avoid. Contact your dietitian for more information. This information is not intended to replace advice given to you by your health care provider. Make sure you discuss any questions you have with your health care provider. Document Released: 12/17/2011 Document Revised: 02/22/2016 Document Reviewed: 09/16/2013 Elsevier Interactive Patient Education  [employer].  Please be sure medication list is accurate. If a new problem present, please set up appointment sooner than planned today.

## 2017-05-09 ENCOUNTER — Encounter: Payer: Self-pay | Admitting: Family Medicine

## 2017-05-09 ENCOUNTER — Other Ambulatory Visit: Payer: Self-pay | Admitting: *Deleted

## 2017-05-09 DIAGNOSIS — E782 Mixed hyperlipidemia: Secondary | ICD-10-CM

## 2017-05-09 MED ORDER — SIMVASTATIN 20 MG PO TABS: 20.0000 mg | ORAL_TABLET | Freq: Every day | ORAL | 3 refills | Status: DC

## 2017-05-15 DIAGNOSIS — N201 Calculus of ureter: Secondary | ICD-10-CM | POA: Diagnosis not present

## 2017-06-12 DIAGNOSIS — N201 Calculus of ureter: Secondary | ICD-10-CM | POA: Diagnosis not present

## 2017-08-22 ENCOUNTER — Encounter: Payer: Self-pay | Admitting: Family Medicine

## 2017-09-08 NOTE — Progress Notes (Signed)
HPI:   [name] is a 42 y.o. female, who is here today for 4 months follow up.   She was last seen 05/07/2017   Hyperlipidemia:  She discontinued Simvastatin 20 mg achy muscles, hives, and pruritus. She took medication for a month. Symptoms resolved after discontinuing medication.   Following a low fat diet: Yes..   Lab Results  Component Value Date   CHOL 263 (H) 05/07/2017   HDL 36.00 (L) 05/07/2017   LDLCALC 162 (H) 05/07/2016   LDLDIRECT 156.0 05/07/2017   TRIG 323.0 (H) 05/07/2017   CHOLHDL 7 05/07/2017    She has been exercising regularly, elliptical and lifting wt some for a few weeks. She is also trying to follow a healthy diet.    Review of Systems  Constitutional: Positive for fatigue. Negative for activity change, appetite change and fever.  HENT: Negative for mouth sores, nosebleeds and sore throat.   Eyes: Negative for redness and visual disturbance.  Respiratory: Negative for cough, shortness of breath and wheezing.   Cardiovascular: Negative for chest pain, palpitations and leg swelling.  Gastrointestinal: Negative for abdominal pain, nausea and vomiting.       Negative for changes in bowel habits.  Endocrine: Negative for polydipsia, polyphagia and polyuria.  Genitourinary: Negative for decreased urine volume and hematuria.  Neurological: Negative for syncope, weakness and headaches.  Psychiatric/Behavioral: Negative for confusion. The patient is nervous/anxious.      Current Outpatient Medications on File Prior to Visit  Medication Sig Dispense Refill  . albuterol (PROVENTIL HFA) 108 (90 Base) MCG/ACT inhaler Inhale 2 puffs into the lungs every 6 (six) hours as needed for wheezing or shortness of breath. 1 Inhaler 3  . cetirizine (ZYRTEC) 10 MG tablet Take 10 mg by mouth daily.      . Cholecalciferol (VITAMIN D3 PO) Take 2,000 mg by mouth daily.     . LUTEIN PO Take 1 capsule by mouth daily.    . Multiple Vitamins-Minerals  (MULTIVITAMIN WITH MINERALS) tablet Take 1 tablet by mouth daily.    . Omega-3 Fatty Acids (FISH OIL) 1000 MG CAPS Take by mouth.    . Probiotic Product (PROBIOTIC PO) Take 1 tablet by mouth every other day.    . PSYLLIUM HUSK PO Take 2 capsules by mouth daily.    Marland Kitchen SPIRULINA PO Take 1 tablet by mouth daily. For food allergy prevention    . TURMERIC PO Take by mouth.    . metoCLOPramide (REGLAN) 10 MG tablet Take 1 tablet (10 mg total) by mouth every 6 (six) hours as needed for nausea (nausea/headache). (Patient not taking: Reported on 09/09/2017) 6 tablet 0  . omeprazole (PRILOSEC) 20 MG capsule Take 1 capsule (20 mg total) by mouth daily before breakfast. (Patient not taking: Reported on 09/09/2017) 30 capsule 1  . simvastatin (ZOCOR) 20 MG tablet Take 1 tablet (20 mg total) at bedtime by mouth. (Patient not taking: Reported on 09/09/2017) 90 tablet 3   No current facility-administered medications on file prior to visit.      Past Medical History:  Diagnosis Date  . ALLERGIC RHINITIS 05/31/2008  . Anemia   . ANXIETY 05/31/2008  . ASTHMA 05/31/2008  . Asthma   . CHOLELITHIASIS 05/31/2008  . High blood pressure    readings  . History of kidney stones   . History of right breast biopsy 2017  . HYPERLIPIDEMIA 05/31/2008  . Kidney stone   . NEPHROLITHIASIS, HX OF 05/31/2008   Allergies  Allergen Reactions  . Beef-Derived Products Diarrhea    Nausea and vomiting  . Prochlorperazine Edisylate Shortness Of Breath    Compazine Tongue swelling  . Banana Itching and Nausea And Vomiting  . Latex Hives  . Morphine And Related Itching  . Other     Tree Nuts (can have peanuts and cashews) Pyrethrine (ingredient in lice products)- fever and rash  . Oxycodone-Acetaminophen Nausea And Vomiting    Social History   Socioeconomic History  . Marital status: Married    Spouse name: None  . Number of children: 3  . Years of education: BS  . Highest education level: None  Social Needs  .  Financial resource strain: None  . Food insecurity - worry: None  . Food insecurity - inability: None  . Transportation needs - medical: None  . Transportation needs - non-medical: None  Occupational History  . Occupation: Soil scientist  . Occupation: Substitute  Tobacco Use  . Smoking status: Never Smoker  . Smokeless tobacco: Never Used  Substance and Sexual Activity  . Alcohol use: No  . Drug use: No  . Sexual activity: Yes    Birth control/protection: Surgical  Other Topics Concern  . None  Social History Narrative   Lives with husband & 3 kids, work at home, PT outside of home. Often volunteers.    Vitals:   09/09/17 0826  BP: 122/76  Pulse: 71  Resp: 12  Temp: 98.1 F (36.7 C)  SpO2: 98%   Body mass index is 33.68 kg/m.  Wt Readings from Last 3 Encounters:  09/09/17 190 lb 2 oz (86.2 kg)  05/07/17 182 lb 8 oz (82.8 kg)  03/30/17 185 lb (83.9 kg)      Physical Exam  Nursing note and vitals reviewed. Constitutional: She is oriented to person, place, and time. She appears well-developed. No distress.  HENT:  Head: Normocephalic and atraumatic.  Mouth/Throat: Oropharynx is clear and moist and mucous membranes are normal.  Eyes: Conjunctivae and EOM are normal. Pupils are equal, round, and reactive to light.  Cardiovascular: Normal rate and regular rhythm.  No murmur heard. Pulses:      Dorsalis pedis pulses are 2+ on the right side, and 2+ on the left side.  Respiratory: Effort normal and breath sounds normal. No respiratory distress.  GI: Soft. She exhibits no mass. There is no hepatomegaly. There is no tenderness.  Musculoskeletal: She exhibits no edema.  Lymphadenopathy:    She has no cervical adenopathy.  Neurological: She is alert and oriented to person, place, and time. She has normal strength. Gait normal.  Skin: Skin is warm. No erythema.  Psychiatric: Her mood appears anxious.  Well groomed, good eye contact.      ASSESSMENT AND PLAN:   Ms.  Angela Harrell was seen today for 4 months follow-up.  Orders Placed This Encounter  Procedures  . Lipid panel  . Comprehensive metabolic panel  . LDL cholesterol, direct   Lab Results  Component Value Date   CHOL 209 (H) 09/09/2017   HDL 36.80 (L) 09/09/2017   LDLCALC 162 (H) 05/07/2016   LDLDIRECT 121.0 09/09/2017   TRIG 203.0 (H) 09/09/2017   CHOLHDL 6 09/09/2017   Lab Results  Component Value Date   CREATININE 0.76 09/09/2017   BUN 12 09/09/2017   NA 137 09/09/2017   K 4.6 09/09/2017   CL 101 09/09/2017   CO2 29 09/09/2017    Lab Results  Component Value Date   ALT 22 09/09/2017  AST 19 09/09/2017   ALKPHOS 55 09/09/2017   BILITOT 0.5 09/09/2017    Hyperlipemia, mixed She will continue non pharmacologic treatment. May recommend Pravastatin, Crestor,or Livalo depending of FLP results.  Class 1 obesity with body mass index (BMI) of 33.0 to 33.9 in adult We discussed benefits of wt loss as well as adverse effects of obesity. Consistency with healthy diet and physical activity recommended.   The 10-year ASCVD risk score Mikey Bussing DC Brooke Bonito., et al., 2013) is: 1.3%   Values used to calculate the score:     Age: 33 years     Sex: Female     Is Non-Hispanic African American: No     Diabetic: No     Tobacco smoker: No     Systolic Blood Pressure: 111 mmHg     Is BP treated: No     HDL Cholesterol: 36.8 mg/dL     Total Cholesterol: 209 mg/dL    -Ms. Angela Harrell was advised to return sooner than planned today if new concerns arise.       Angela Harrell G. Martinique, MD  [facility]. [place] office.

## 2017-09-09 ENCOUNTER — Ambulatory Visit: Payer: BLUE CROSS/BLUE SHIELD | Admitting: Family Medicine

## 2017-09-09 ENCOUNTER — Encounter: Payer: Self-pay | Admitting: Family Medicine

## 2017-09-09 VITALS — BP 122/76 | HR 71 | Temp 98.1°F | Resp 12 | Ht 63.0 in | Wt 190.1 lb

## 2017-09-09 DIAGNOSIS — E669 Obesity, unspecified: Secondary | ICD-10-CM | POA: Diagnosis not present

## 2017-09-09 DIAGNOSIS — Z6833 Body mass index (BMI) 33.0-33.9, adult: Secondary | ICD-10-CM | POA: Diagnosis not present

## 2017-09-09 DIAGNOSIS — E782 Mixed hyperlipidemia: Secondary | ICD-10-CM

## 2017-09-09 LAB — COMPREHENSIVE METABOLIC PANEL
ALBUMIN: 4.2 g/dL (ref 3.5–5.2)
ALK PHOS: 55 U/L (ref 39–117)
ALT: 22 U/L (ref 0–35)
AST: 19 U/L (ref 0–37)
BILIRUBIN TOTAL: 0.5 mg/dL (ref 0.2–1.2)
BUN: 12 mg/dL (ref 6–23)
CALCIUM: 9.8 mg/dL (ref 8.4–10.5)
CO2: 29 mEq/L (ref 19–32)
CREATININE: 0.76 mg/dL (ref 0.40–1.20)
Chloride: 101 mEq/L (ref 96–112)
GFR: 89.22 mL/min (ref >60.00)
Glucose, Bld: 100 mg/dL — ABNORMAL HIGH (ref 70–99)
Potassium: 4.6 mEq/L (ref 3.5–5.1)
SODIUM: 137 meq/L (ref 135–145)
TOTAL PROTEIN: 7.1 g/dL (ref 6.0–8.3)

## 2017-09-09 LAB — LDL CHOLESTEROL, DIRECT: LDL DIRECT: 121 mg/dL

## 2017-09-09 LAB — LIPID PANEL
CHOLESTEROL: 209 mg/dL — AB (ref 0–200)
HDL: 36.8 mg/dL — ABNORMAL LOW (ref >39.00)
NonHDL: 171.92
Total CHOL/HDL Ratio: 6
Triglycerides: 203 mg/dL — ABNORMAL HIGH (ref 0.0–149.0)
VLDL: 40.6 mg/dL — ABNORMAL HIGH (ref 0.0–40.0)

## 2017-09-09 NOTE — Assessment & Plan Note (Signed)
She will continue non pharmacologic treatment. May recommend Pravastatin, Crestor,or Livalo depending of FLP results.

## 2017-09-09 NOTE — Assessment & Plan Note (Signed)
We discussed benefits of wt loss as well as adverse effects of obesity. Consistency with healthy diet and physical activity recommended.  

## 2017-09-09 NOTE — Patient Instructions (Addendum)
A few things to remember from today's visit:   Hyperlipemia, mixed - Plan: Lipid panel, Comprehensive metabolic panel  Class 1 obesity without serious comorbidity with body mass index (BMI) of 33.0 to 33.9 in adult, unspecified obesity type  Continue the good work with a healthy diet and exercise, consistently is the ey.  Please be sure medication list is accurate. If a new problem present, please set up appointment sooner than planned today.

## 2017-09-14 ENCOUNTER — Encounter: Payer: Self-pay | Admitting: Family Medicine

## 2017-11-28 IMAGING — CT CT RENAL STONE PROTOCOL
2 of 4 series · 16 of 46 positions shown, 18 images · non-contrast
Comparison: One view abdomen 03/13/2017. abdominopelvic CT
02/12/2017.

CLINICAL DATA: Right flank pain for 1 day.

EXAM:
CT ABDOMEN AND PELVIS WITHOUT CONTRAST
TECHNIQUE: Multidetector CT imaging of the abdomen and pelvis was performed
following the standard protocol without IV contrast.

[Series 3: renal stone 5.0 · axial · 0.98mm/px · z∈[-947,-482]mm · 13 of 101 slices shown, 15 images]
[im 4/101  soft-tissue]
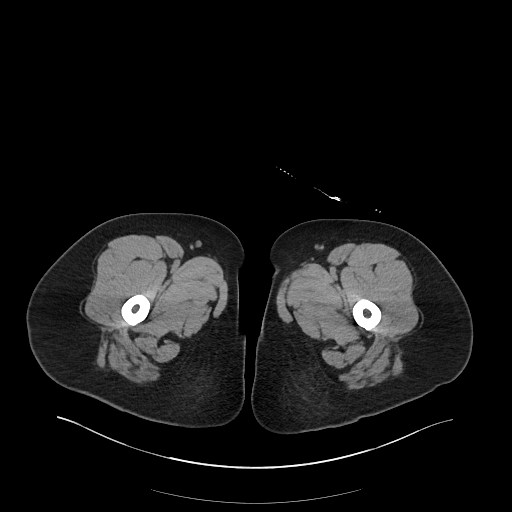
[im 4/101  bone]
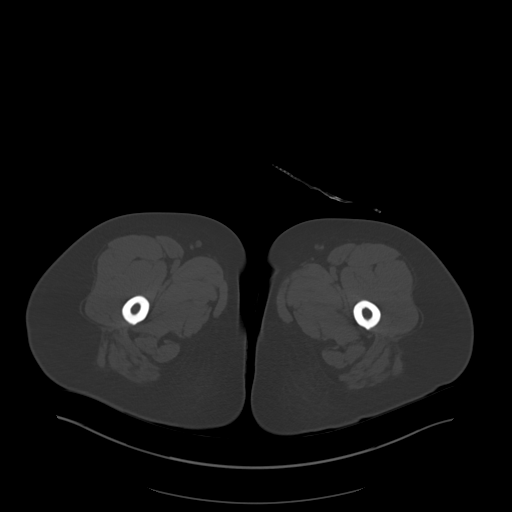
[im 12/101  soft-tissue]
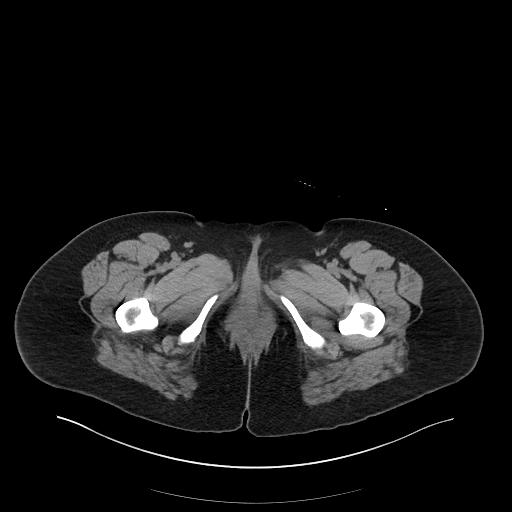
[im 20/101  soft-tissue]
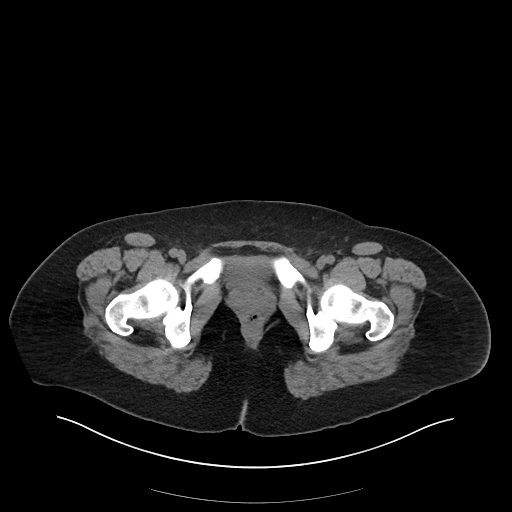
[im 27/101  soft-tissue]
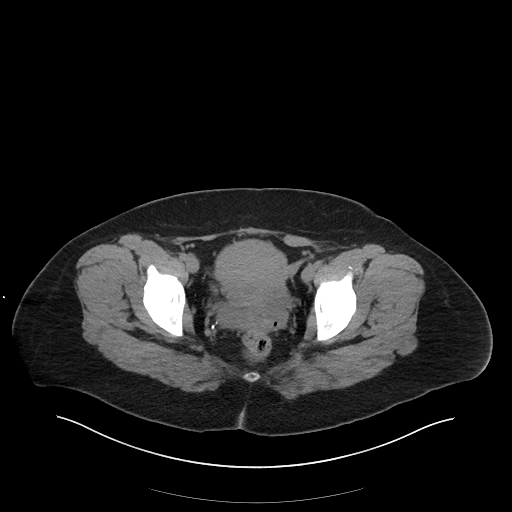
[im 35/101  soft-tissue]
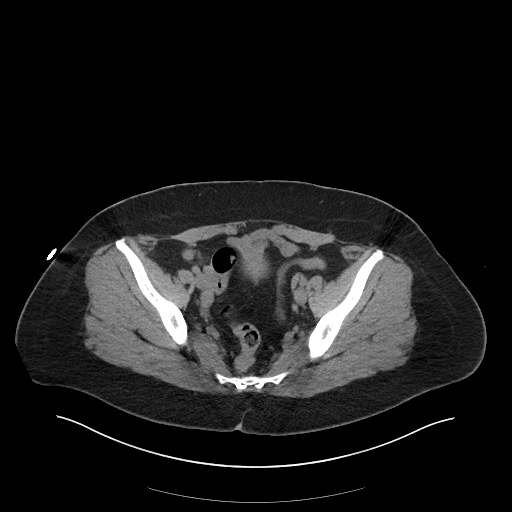
[im 43/101  soft-tissue]
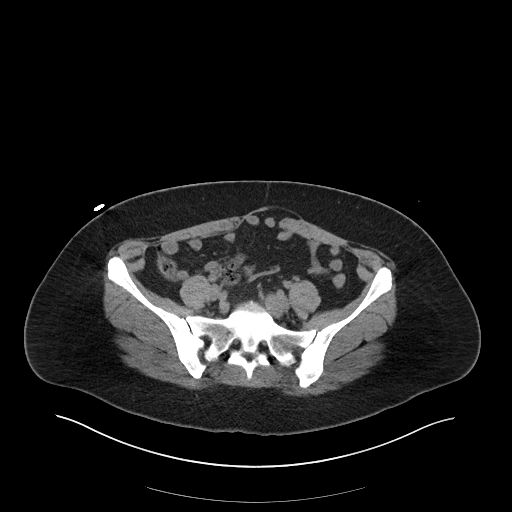
[im 51/101  soft-tissue]
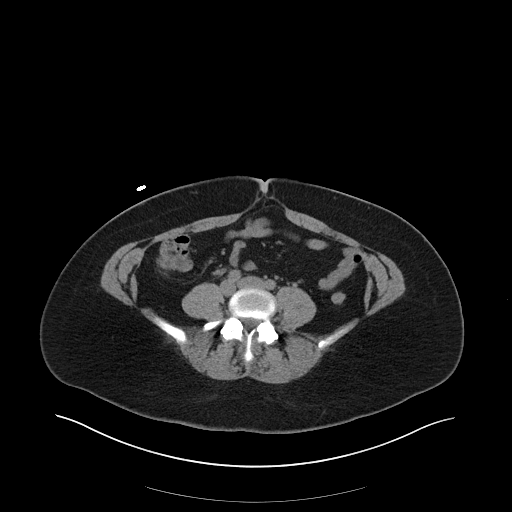
[im 58/101  soft-tissue]
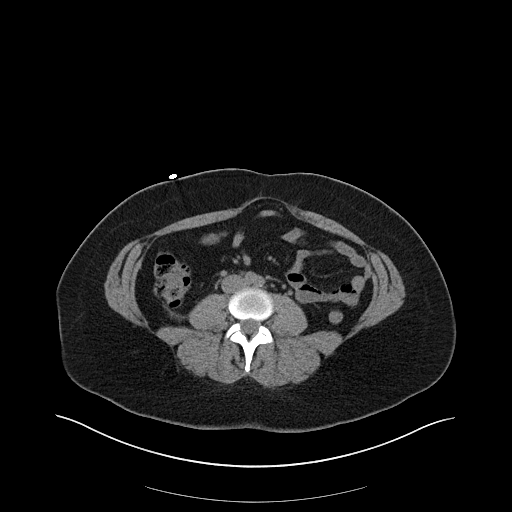
[im 66/101  soft-tissue]
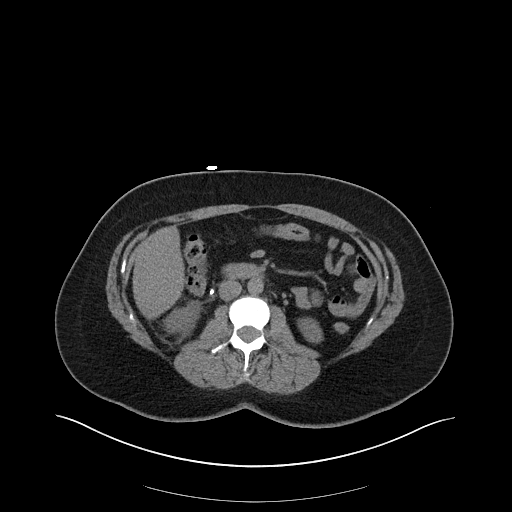
[im 66/101  bone]
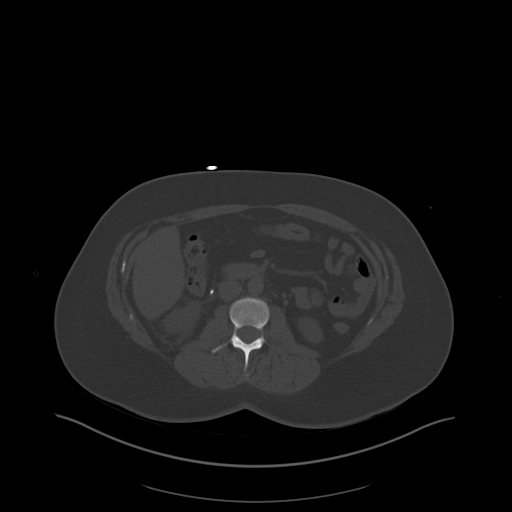
[im 74/101  soft-tissue]
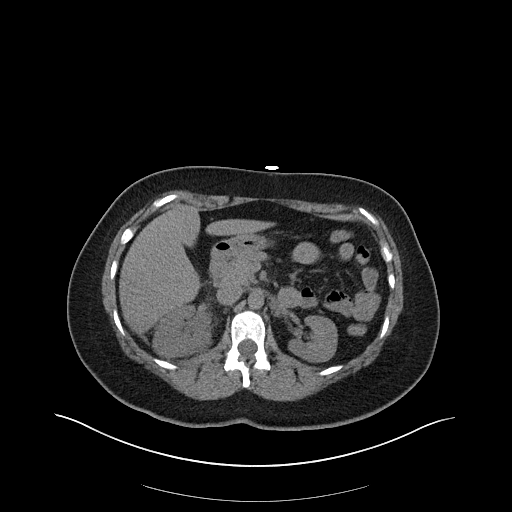
[im 81/101  soft-tissue]
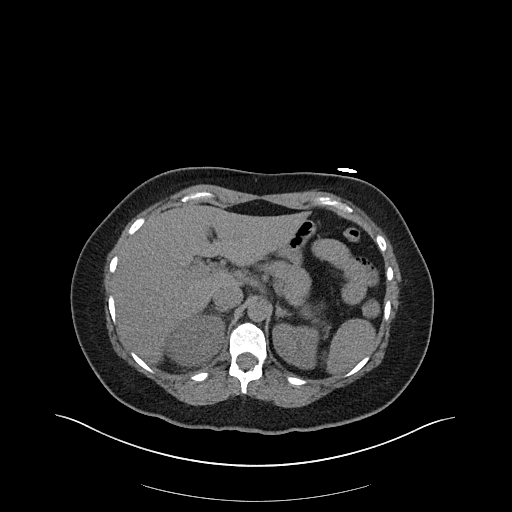
[im 89/101  soft-tissue]
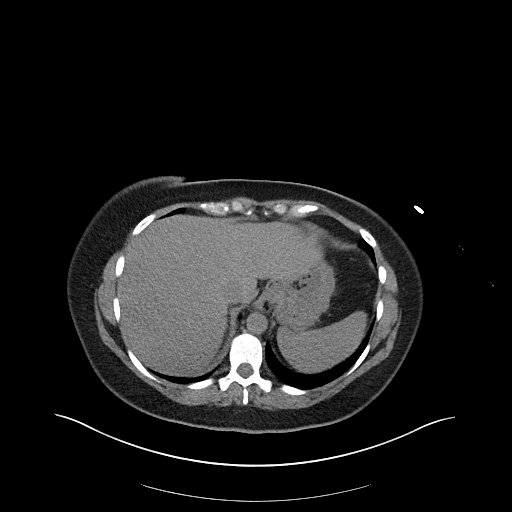
[im 97/101  soft-tissue]
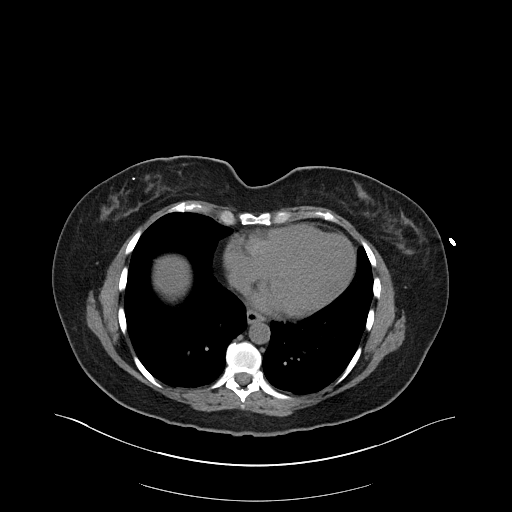

[Series 6: renal stone 3.0 cor · coronal · 0.79mm/px · 3 of 96 slices shown]
[im 32/96  soft-tissue]
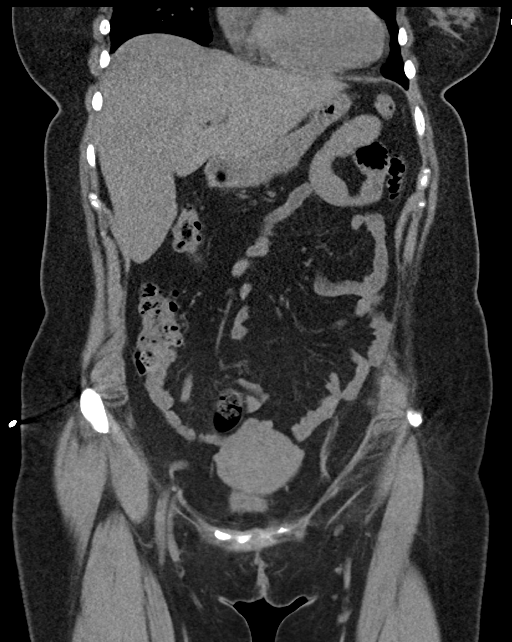
[im 43/96  soft-tissue]
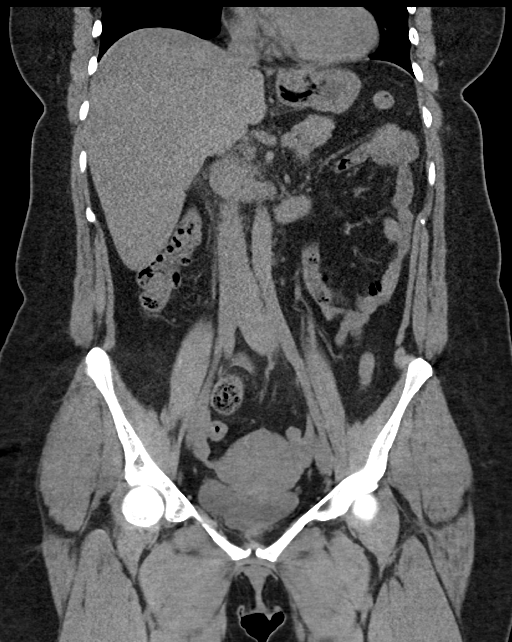
[im 53/96  soft-tissue]
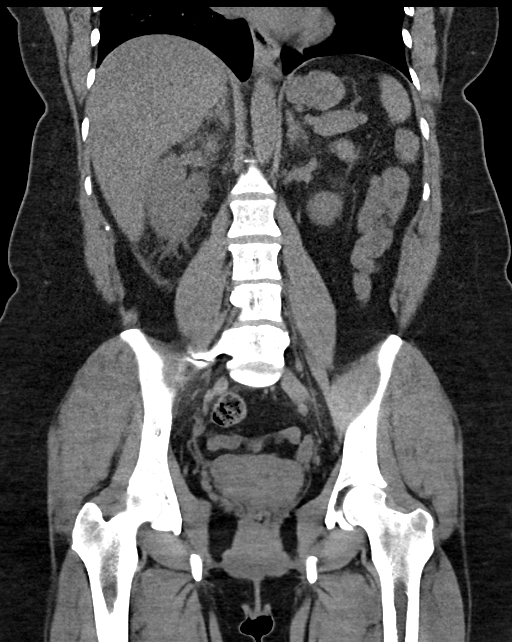

[16 of 46 positions shown; findings below may reference images not displayed]

FINDINGS: Lower chest: Clear lung bases. No significant pleural or pericardial
effusion.

Hepatobiliary: The liver demonstrates diffusely decreased density
consistent with steatosis. No focal lesions are identified on
noncontrast imaging. No significant biliary dilatation status post
cholecystectomy.

Pancreas: Unremarkable. No pancreatic ductal dilatation or
surrounding inflammatory changes.

Spleen: Normal in size without focal abnormality.

Adrenals/Urinary Tract: Both adrenal glands appear normal. There is
new asymmetric perinephric soft tissue stranding on the right
associated with mild dilatation of the right renal collecting system
and proximal ureter. There is an obstructing calculus in the
proximal right ureter, measuring 6 mm on coronal image 52. This is
located at the L2-3 disc space level and is visible on the scout
image. No other urinary tract calculi are demonstrated. The left
kidney appears unremarkable. The bladder appears unremarkable.

Stomach/Bowel: No evidence of bowel wall thickening, distention or
surrounding inflammatory change. Small hiatal hernia and mild
colonic diverticulosis noted. The appendix appears normal.

Vascular/Lymphatic: There are no enlarged abdominal or pelvic lymph
nodes. No significant vascular findings on noncontrast imaging.

Reproductive: The uterus and adnexae appear unremarkable.

Other: Diastasis of the rectus abdominus muscles with a broad-based
periumbilical hernia containing only fat, stable. No ascites.

Musculoskeletal: No acute or significant osseous findings.
IMPRESSION: 1. Obstructing 6 mm calculus in the proximal right ureter with
associated hydronephrosis and perinephric soft tissue stranding.
2. No other urinary tract calculi.
3. Hepatic steatosis.

## 2018-01-30 DIAGNOSIS — Z1231 Encounter for screening mammogram for malignant neoplasm of breast: Secondary | ICD-10-CM | POA: Diagnosis not present

## 2018-01-30 DIAGNOSIS — Z803 Family history of malignant neoplasm of breast: Secondary | ICD-10-CM | POA: Diagnosis not present

## 2018-03-12 DIAGNOSIS — Z1151 Encounter for screening for human papillomavirus (HPV): Secondary | ICD-10-CM | POA: Diagnosis not present

## 2018-03-12 DIAGNOSIS — Z6832 Body mass index (BMI) 32.0-32.9, adult: Secondary | ICD-10-CM | POA: Diagnosis not present

## 2018-03-12 DIAGNOSIS — Z01419 Encounter for gynecological examination (general) (routine) without abnormal findings: Secondary | ICD-10-CM | POA: Diagnosis not present

## 2018-03-16 ENCOUNTER — Encounter: Payer: Self-pay | Admitting: Family Medicine

## 2018-03-18 ENCOUNTER — Ambulatory Visit: Payer: BLUE CROSS/BLUE SHIELD | Admitting: Family Medicine

## 2018-03-18 ENCOUNTER — Encounter: Payer: Self-pay | Admitting: Family Medicine

## 2018-03-18 VITALS — BP 120/82 | HR 87 | Temp 98.1°F | Resp 12 | Ht 63.0 in | Wt 180.2 lb

## 2018-03-18 DIAGNOSIS — R1013 Epigastric pain: Secondary | ICD-10-CM | POA: Diagnosis not present

## 2018-03-18 DIAGNOSIS — K219 Gastro-esophageal reflux disease without esophagitis: Secondary | ICD-10-CM

## 2018-03-18 DIAGNOSIS — R110 Nausea: Secondary | ICD-10-CM

## 2018-03-18 DIAGNOSIS — F419 Anxiety disorder, unspecified: Secondary | ICD-10-CM | POA: Diagnosis not present

## 2018-03-18 DIAGNOSIS — R197 Diarrhea, unspecified: Secondary | ICD-10-CM

## 2018-03-18 LAB — BASIC METABOLIC PANEL
BUN: 10 mg/dL (ref 6–23)
CALCIUM: 9.9 mg/dL (ref 8.4–10.5)
CO2: 30 mEq/L (ref 19–32)
Chloride: 100 mEq/L (ref 96–112)
Creatinine, Ser: 0.76 mg/dL (ref 0.40–1.20)
GFR: 88.99 mL/min (ref >60.00)
GLUCOSE: 102 mg/dL — AB (ref 70–99)
Potassium: 4.3 mEq/L (ref 3.5–5.1)
SODIUM: 137 meq/L (ref 135–145)

## 2018-03-18 LAB — CBC
HCT: 39.7 % (ref 36.0–46.0)
Hemoglobin: 13.3 g/dL (ref 12.0–15.0)
MCHC: 33.5 g/dL (ref 30.0–36.0)
MCV: 86.2 fl (ref 78.0–100.0)
PLATELETS: 302 10*3/uL (ref 150.0–400.0)
RBC: 4.61 Mil/uL (ref 3.87–5.11)
RDW: 14 % (ref 11.5–15.5)
WBC: 8.2 10*3/uL (ref 4.0–10.5)

## 2018-03-18 LAB — POCT URINE PREGNANCY: PREG TEST UR: NEGATIVE

## 2018-03-18 LAB — TSH: TSH: 0.77 u[IU]/mL (ref 0.35–4.50)

## 2018-03-18 LAB — LIPASE: LIPASE: 19 U/L (ref 11.0–59.0)

## 2018-03-18 MED ORDER — ONDANSETRON HCL 4 MG PO TABS: 4.0000 mg | ORAL_TABLET | Freq: Three times a day (TID) | ORAL | 0 refills | Status: DC | PRN

## 2018-03-18 MED ORDER — CITALOPRAM HYDROBROMIDE 10 MG PO TABS: 10.0000 mg | ORAL_TABLET | Freq: Every day | ORAL | 1 refills | Status: DC

## 2018-03-18 MED ORDER — OMEPRAZOLE 20 MG PO CPDR: 20.0000 mg | DELAYED_RELEASE_CAPSULE | Freq: Every day | ORAL | 1 refills | Status: DC

## 2018-03-18 NOTE — Patient Instructions (Signed)
A few things to remember from today's visit:   Diarrhea, unspecified type - Plan: Basic metabolic panel, CBC, TSH, Lipase  Epigastric abdominal pain - Plan: Lipase  Nausea without vomiting - Plan: Lipase, POCT urine pregnancy  Anxiety disorder, unspecified type - Plan: citalopram (CELEXA) 10 MG tablet  Celexa 10 mg, which is very low dose, was added today. You can take Zofran as needed for nausea. I would like to see you back in 4 weeks, before if needed.  Please be sure medication list is accurate. If a new problem present, please set up appointment sooner than planned today.

## 2018-03-18 NOTE — Progress Notes (Signed)
ACUTE VISIT   HPI:  Chief Complaint  Patient presents with  . Digestive issues    had diarrhea up until Monday before taking antidiarrhea medication, no BM since Monday  . Panic Attack    started this past week causing nausea and insomnia    [name] is a 42 y.o. female, who is here today complaining of worsening depression and anxiety.  She has known history of anxiety and depression, she has been able to manage symptoms with nonpharmacologic treatment. For the past couple weeks she has been "more emotional and moody", having panic attacks.   She has had nausea and episode of diarrhea, both she attributes to anxiety. Nausea is exacerbated by food intake.  She lives with her 3 children, most of the time her husband is traveling. She denies suicidal thoughts. Family history is positive for psychiatric disorders, her brother with history of bipolar disorder and suicidal ideation. She states that she had a traumatic childhood, she does not want to elaborate. She mentions that her daughter is having anxiety issues, she is on psychologic treatment.  She already has an appointment with psychotherapist in 03/2018. She remembers trying medication a few years ago when she was living in [place] (over 10 years ago), she does not remember the name but she discontinued because she was "blacking out."  She has had intermittent diarrhea since she had cholecystectomy but seems to be getting worse for the past 2 weeks.  Episode of diarrhea is preceded by abdominal cramps, which improves after defecation. Symptoms are affecting her sleep.  She has not noted blood in the stool. No fevers, chills, vomiting, or urinary symptoms.  No sick contacts or recent travel.  2 days ago she took Lomotil and has not had a bowel movement since then. She feels bloated. One episode of epigastric abdominal pain radiated to mid back,severe.  Trigger factors not identified, resolved after a  couple hours sitting in bed.  She has history of GERD, she wonders if she needs to resume omeprazole 20 mg. She has not had heartburn.  Lab Results  Component Value Date   TSH 0.93 11/09/2014   LMP yesterday,earlier and heavier than usual.    Review of Systems  Constitutional: Positive for appetite change and fatigue. Negative for chills and fever.  HENT: Negative for mouth sores, sore throat and trouble swallowing.   Respiratory: Negative for cough, shortness of breath and wheezing.   Cardiovascular: Negative for chest pain and palpitations.  Gastrointestinal: Positive for abdominal pain, diarrhea and nausea. Negative for blood in stool and vomiting.  Genitourinary: Negative for decreased urine volume, dysuria and hematuria.  Musculoskeletal: Negative for arthralgias and myalgias.  Skin: Negative for rash.  Neurological: Negative for syncope, weakness and headaches.  Hematological: Negative for adenopathy. Does not bruise/bleed easily.  Psychiatric/Behavioral: Positive for sleep disturbance. Negative for confusion, hallucinations and suicidal ideas. The patient is nervous/anxious.       Current Outpatient Medications on File Prior to Visit  Medication Sig Dispense Refill  . albuterol (PROVENTIL HFA) 108 (90 Base) MCG/ACT inhaler Inhale 2 puffs into the lungs every 6 (six) hours as needed for wheezing or shortness of breath. 1 Inhaler 3  . cetirizine (ZYRTEC) 10 MG tablet Take 10 mg by mouth daily.      . Cholecalciferol (VITAMIN D3 PO) Take 2,000 mg by mouth daily.     . LUTEIN PO Take 1 capsule by mouth daily.    . metoCLOPramide (REGLAN) 10 MG  tablet Take 1 tablet (10 mg total) by mouth every 6 (six) hours as needed for nausea (nausea/headache). 6 tablet 0  . Multiple Vitamins-Minerals (MULTIVITAMIN WITH MINERALS) tablet Take 1 tablet by mouth daily.    . Omega-3 Fatty Acids (FISH OIL) 1000 MG CAPS Take by mouth.    . Probiotic Product (PROBIOTIC PO) Take 1 tablet by mouth  every other day.    . PSYLLIUM HUSK PO Take 2 capsules by mouth daily.    Marland Kitchen SPIRULINA PO Take 1 tablet by mouth daily. For food allergy prevention    . TURMERIC PO Take by mouth.     No current facility-administered medications on file prior to visit.      Past Medical History:  Diagnosis Date  . ALLERGIC RHINITIS 05/31/2008  . Anemia   . ANXIETY 05/31/2008  . ASTHMA 05/31/2008  . Asthma   . CHOLELITHIASIS 05/31/2008  . High blood pressure    readings  . History of kidney stones   . History of right breast biopsy 2017  . HYPERLIPIDEMIA 05/31/2008  . Kidney stone   . NEPHROLITHIASIS, HX OF 05/31/2008   Allergies  Allergen Reactions  . Beef-Derived Products Diarrhea    Nausea and vomiting  . Prochlorperazine Edisylate Shortness Of Breath    Compazine Tongue swelling  . Banana Itching and Nausea And Vomiting  . Latex Hives  . Morphine And Related Itching  . Other     Tree Nuts (can have peanuts and cashews) Pyrethrine (ingredient in lice products)- fever and rash  . Oxycodone-Acetaminophen Nausea And Vomiting    Social History   Socioeconomic History  . Marital status: Married    Spouse name: Not on file  . Number of children: 3  . Years of education: BS  . Highest education level: Not on file  Occupational History  . Occupation: Soil scientist  . Occupation: Substitute  Social Needs  . Financial resource strain: Not on file  . Food insecurity:    Worry: Not on file    Inability: Not on file  . Transportation needs:    Medical: Not on file    Non-medical: Not on file  Tobacco Use  . Smoking status: Never Smoker  . Smokeless tobacco: Never Used  Substance and Sexual Activity  . Alcohol use: No  . Drug use: No  . Sexual activity: Yes    Birth control/protection: Surgical  Lifestyle  . Physical activity:    Days per week: Not on file    Minutes per session: Not on file  . Stress: Not on file  Relationships  . Social connections:    Talks on phone: Not on file      Gets together: Not on file    Attends religious service: Not on file    Active member of club or organization: Not on file    Attends meetings of clubs or organizations: Not on file    Relationship status: Not on file  Other Topics Concern  . Not on file  Social History Narrative   Lives with husband & 3 kids, work at home, PT outside of home. Often volunteers.    Vitals:   03/18/18 0926  BP: 120/82  Pulse: 87  Resp: 12  Temp: 98.1 F (36.7 C)  SpO2: 99%   Body mass index is 31.93 kg/m.   Physical Exam  Nursing note and vitals reviewed. Constitutional: She is oriented to person, place, and time. She appears well-developed. She does not appear ill. No distress.  HENT:  Head: Normocephalic and atraumatic.  Mouth/Throat: Oropharynx is clear and moist. Mucous membranes are dry.  Eyes: Conjunctivae are normal. No scleral icterus.  Cardiovascular: Normal rate and regular rhythm.  No murmur heard. Respiratory: Effort normal and breath sounds normal. No respiratory distress.  GI: Soft. Bowel sounds are normal. She exhibits no distension and no mass. There is no hepatomegaly. There is no tenderness.  Musculoskeletal: She exhibits no edema.  Lymphadenopathy:    She has no cervical adenopathy.  Neurological: She is alert and oriented to person, place, and time. She has normal strength. Gait normal.  Skin: Skin is warm. No rash noted. No erythema.  Psychiatric: Her mood appears anxious. Her affect is labile.  Well groomed, good eye contact.      ASSESSMENT AND PLAN:   Ms. Royale was seen today for digestive issues and panic attack.  Diagnoses and all orders for this visit:  Lab Results  Component Value Date   WBC 8.2 03/18/2018   HGB 13.3 03/18/2018   HCT 39.7 03/18/2018   MCV 86.2 03/18/2018   PLT 302.0 03/18/2018   Lab Results  Component Value Date   CREATININE 0.76 03/18/2018   BUN 10 03/18/2018   NA 137 03/18/2018   K 4.3 03/18/2018   CL 100 03/18/2018    CO2 30 03/18/2018   Lab Results  Component Value Date   TSH 0.77 03/18/2018   Lab Results  Component Value Date   LIPASE 19.0 03/18/2018     Diarrhea, unspecified type  It seems to be aggravated by anxiety. Adequate hydration. Instructed about warning signs.  -     Basic metabolic panel -     CBC -     TSH -     Lipase  Epigastric abdominal pain  Omeprazole 20 mg daily prn. GERD precautions.  -     Lipase  Nausea without vomiting  Adequate hydration, small and frequent sips of clear fluids. ? Anxiety relegated vs GERD, other possible causes discussed. Further recommendations according to lab results.  -     ondansetron (ZOFRAN) 4 MG tablet; Take 1 tablet (4 mg total) by mouth every 8 (eight) hours as needed for nausea or vomiting. -     Lipase -     POCT urine pregnancy  Anxiety disorder, unspecified After discussion of pharmacologic treatment options, she agrees with trying 10 mg daily. Some side effects discussed. Instructed about warning signs. I will see her back in 4 weeks.    Return in about 4 weeks (around 04/15/2018) for anxiety.     Melton Walls G. Martinique, MD  [facility]. [place] office.

## 2018-03-18 NOTE — Assessment & Plan Note (Signed)
After discussion of pharmacologic treatment options, she agrees with trying 10 mg daily. Some side effects discussed. Instructed about warning signs. I will see her back in 4 weeks.

## 2018-03-27 DIAGNOSIS — J029 Acute pharyngitis, unspecified: Secondary | ICD-10-CM | POA: Diagnosis not present

## 2018-03-27 DIAGNOSIS — J0110 Acute frontal sinusitis, unspecified: Secondary | ICD-10-CM | POA: Diagnosis not present

## 2018-03-27 DIAGNOSIS — Z23 Encounter for immunization: Secondary | ICD-10-CM | POA: Diagnosis not present

## 2018-04-10 ENCOUNTER — Ambulatory Visit (INDEPENDENT_AMBULATORY_CARE_PROVIDER_SITE_OTHER): Payer: BLUE CROSS/BLUE SHIELD | Admitting: Psychology

## 2018-04-10 DIAGNOSIS — F418 Other specified anxiety disorders: Secondary | ICD-10-CM | POA: Diagnosis not present

## 2018-04-15 ENCOUNTER — Ambulatory Visit: Payer: BLUE CROSS/BLUE SHIELD | Admitting: Family Medicine

## 2018-04-15 ENCOUNTER — Encounter: Payer: Self-pay | Admitting: Family Medicine

## 2018-04-15 VITALS — BP 124/76 | HR 82 | Temp 97.8°F | Resp 12 | Ht 63.0 in | Wt 180.0 lb

## 2018-04-15 DIAGNOSIS — L299 Pruritus, unspecified: Secondary | ICD-10-CM

## 2018-04-15 DIAGNOSIS — F419 Anxiety disorder, unspecified: Secondary | ICD-10-CM | POA: Diagnosis not present

## 2018-04-15 MED ORDER — TRIAMCINOLONE 0.1 % CREAM:EUCERIN CREAM 1:1: 1.0000 "application " | TOPICAL_CREAM | Freq: Two times a day (BID) | CUTANEOUS | 1 refills | Status: AC | PRN

## 2018-04-15 MED ORDER — CITALOPRAM HYDROBROMIDE 10 MG PO TABS: 10.0000 mg | ORAL_TABLET | Freq: Every day | ORAL | 0 refills | Status: DC

## 2018-04-15 NOTE — Progress Notes (Signed)
HPI:   Angela Harrell is a 42 y.o. female, who is here today to follow on recent OV. She was last seen on 03/18/2017, at that time she was complaining of having panic attacks.  Celexa 10 mg daily was recommended.  She has tolerated medication well,no side effects.  She has noted great improvement on symptoms. She is dealing with stress better. Sleeping better.  Last episode of panic attack was 10 days ago.  Denies suicidal thoughts.   Today she is c/o skin pruritus on chest and lower back. No rash. Hx of hives with stress years ago.  Since her last OV she was evaluated at [facility] because ear lobe erythema and tenderness. She was started on abx   Review of Systems  Constitutional: Positive for fatigue. Negative for activity change and appetite change.  Respiratory: Negative for chest tightness, shortness of breath and wheezing.   Cardiovascular: Negative for chest pain, palpitations and leg swelling.  Gastrointestinal: Negative for abdominal pain, diarrhea, nausea and vomiting.  Skin: Negative for rash and wound.  Allergic/Immunologic: Positive for environmental allergies.  Neurological: Negative for dizziness, weakness and headaches.  Psychiatric/Behavioral: Negative for confusion, hallucinations, sleep disturbance and suicidal ideas. The patient is not nervous/anxious.       Current Outpatient Medications on File Prior to Visit  Medication Sig Dispense Refill  . albuterol (PROVENTIL HFA) 108 (90 Base) MCG/ACT inhaler Inhale 2 puffs into the lungs every 6 (six) hours as needed for wheezing or shortness of breath. 1 Inhaler 3  . cetirizine (ZYRTEC) 10 MG tablet Take 10 mg by mouth daily.      . Cholecalciferol (VITAMIN D3 PO) Take 2,000 mg by mouth daily.     . LUTEIN PO Take 1 capsule by mouth daily.    . metoCLOPramide (REGLAN) 10 MG tablet Take 1 tablet (10 mg total) by mouth every 6 (six) hours as needed for nausea (nausea/headache). 6 tablet 0  .  Multiple Vitamins-Minerals (MULTIVITAMIN WITH MINERALS) tablet Take 1 tablet by mouth daily.    . Omega-3 Fatty Acids (FISH OIL) 1000 MG CAPS Take by mouth.    Marland Kitchen omeprazole (PRILOSEC) 20 MG capsule Take 1 capsule (20 mg total) by mouth daily before breakfast. 30 capsule 1  . ondansetron (ZOFRAN) 4 MG tablet Take 1 tablet (4 mg total) by mouth every 8 (eight) hours as needed for nausea or vomiting. 15 tablet 0  . Probiotic Product (PROBIOTIC PO) Take 1 tablet by mouth every other day.    . PSYLLIUM HUSK PO Take 2 capsules by mouth daily.    Marland Kitchen SPIRULINA PO Take 1 tablet by mouth daily. For food allergy prevention    . Turmeric Curcumin 500 MG CAPS Take by mouth.     No current facility-administered medications on file prior to visit.      Past Medical History:  Diagnosis Date  . ALLERGIC RHINITIS 05/31/2008  . Anemia   . ANXIETY 05/31/2008  . ASTHMA 05/31/2008  . Asthma   . CHOLELITHIASIS 05/31/2008  . High blood pressure    readings  . History of kidney stones   . History of right breast biopsy 2017  . HYPERLIPIDEMIA 05/31/2008  . Kidney stone   . NEPHROLITHIASIS, HX OF 05/31/2008   Allergies  Allergen Reactions  . Beef-Derived Products Diarrhea    Nausea and vomiting  . Prochlorperazine Edisylate Shortness Of Breath    Compazine Tongue swelling  . Prochlorperazine Edisylate Swelling    Swelling of throat  .  Tree Extract Anaphylaxis    Throat itchy, roof of mouth gets raw  . Banana Itching and Nausea And Vomiting    Throat gets itchy  . Beef Extract     Diarrhea and stomach cramps   . Oxycodone-Acetaminophen     Vomiting  . Permethrin Rash    Rash and fever  . Statins Itching  . Morphine And Related Itching  . Other     Tree Nuts (can have peanuts and cashews) Pyrethrine (ingredient in lice products)- fever and rash  . Oxycodone-Acetaminophen Nausea And Vomiting  . Latex Hives and Rash    Rash    Social History   Socioeconomic History  . Marital status: Married      Spouse name: Not on file  . Number of children: 3  . Years of education: BS  . Highest education level: Not on file  Occupational History  . Occupation: Soil scientist  . Occupation: Substitute  Social Needs  . Financial resource strain: Not on file  . Food insecurity:    Worry: Not on file    Inability: Not on file  . Transportation needs:    Medical: Not on file    Non-medical: Not on file  Tobacco Use  . Smoking status: Never Smoker  . Smokeless tobacco: Never Used  Substance and Sexual Activity  . Alcohol use: No  . Drug use: No  . Sexual activity: Yes    Birth control/protection: Surgical  Lifestyle  . Physical activity:    Days per week: Not on file    Minutes per session: Not on file  . Stress: Not on file  Relationships  . Social connections:    Talks on phone: Not on file    Gets together: Not on file    Attends religious service: Not on file    Active member of club or organization: Not on file    Attends meetings of clubs or organizations: Not on file    Relationship status: Not on file  Other Topics Concern  . Not on file  Social History Narrative   Lives with husband & 3 kids, work at home, PT outside of home. Often volunteers.    Vitals:   04/15/18 0958  BP: 124/76  Pulse: 82  Resp: 12  Temp: 97.8 F (36.6 C)  SpO2: 98%   Body mass index is 31.89 kg/m.   Physical Exam  Nursing note and vitals reviewed. Constitutional: She is oriented to person, place, and time. She appears well-developed. No distress.  HENT:  Head: Normocephalic and atraumatic.  Mouth/Throat: Oropharynx is clear and moist and mucous membranes are normal.  Eyes: Conjunctivae are normal.  Cardiovascular: Normal rate and regular rhythm.  No murmur heard. Respiratory: Effort normal and breath sounds normal. No respiratory distress.  Musculoskeletal: She exhibits no edema.  Lymphadenopathy:    She has no cervical adenopathy.  Neurological: She is alert and oriented to person,  place, and time. She has normal strength. Gait normal.  Skin: Skin is warm and dry. No rash noted. No erythema.  Psychiatric: Her mood appears anxious. Cognition and memory are normal. She expresses no suicidal ideation. She expresses no suicidal plans.    ASSESSMENT AND PLAN:  Angela Harrell was seen today for follow-up.  Diagnoses and all orders for this visit:  Anxiety disorder, unspecified type  Reporting improvement. She would like to continue same dose of Celexa. In 3-4 weeks she will let me know if she wants to increase dose of Celexa to  20 mg. F/U in 3 months. Instructed about warning signs.  -     citalopram (CELEXA) 10 MG tablet; Take 1 tablet (10 mg total) by mouth daily.  Skin pruritus  No rash appreciated. Possible causes discussed,including medication. Topical treatment with Eucerin:Triamcinolone recommended. For now no changes in medications.  -     Triamcinolone Acetonide (TRIAMCINOLONE 0.1 % CREAM : EUCERIN) CREA; Apply 1 application topically 2 (two) times daily as needed for rash or itching.    25 min face to face OV. > 50% was dedicated to discussion of Dx, prognosis, and some side effects of medication.   Angela Hemrick G. Martinique, MD  [facility]. [place] office.

## 2018-04-15 NOTE — Patient Instructions (Signed)
A few things to remember from today's visit:   Anxiety disorder, unspecified type - Plan: citalopram (CELEXA) 10 MG tablet  Skin pruritus - Plan: Triamcinolone Acetonide (TRIAMCINOLONE 0.1 % CREAM : EUCERIN) CREA  No changes in Celexa dose today. Let me know in 3 to 4 weeks if you want to increase dose from 10 mg to 20 mg. I think skin lesion is due to dry skin.  Please be sure medication list is accurate. If a new problem present, please set up appointment sooner than planned today.

## 2018-04-18 ENCOUNTER — Encounter: Payer: Self-pay | Admitting: Family Medicine

## 2018-04-22 ENCOUNTER — Ambulatory Visit (INDEPENDENT_AMBULATORY_CARE_PROVIDER_SITE_OTHER): Payer: BLUE CROSS/BLUE SHIELD | Admitting: Psychology

## 2018-04-22 DIAGNOSIS — F418 Other specified anxiety disorders: Secondary | ICD-10-CM | POA: Diagnosis not present

## 2018-05-08 ENCOUNTER — Ambulatory Visit (INDEPENDENT_AMBULATORY_CARE_PROVIDER_SITE_OTHER): Payer: BLUE CROSS/BLUE SHIELD | Admitting: Psychology

## 2018-05-08 DIAGNOSIS — F418 Other specified anxiety disorders: Secondary | ICD-10-CM

## 2018-05-20 ENCOUNTER — Ambulatory Visit (INDEPENDENT_AMBULATORY_CARE_PROVIDER_SITE_OTHER): Payer: BLUE CROSS/BLUE SHIELD | Admitting: Psychology

## 2018-05-20 DIAGNOSIS — F418 Other specified anxiety disorders: Secondary | ICD-10-CM | POA: Diagnosis not present

## 2018-05-22 DIAGNOSIS — D2261 Melanocytic nevi of right upper limb, including shoulder: Secondary | ICD-10-CM | POA: Diagnosis not present

## 2018-05-22 DIAGNOSIS — D2272 Melanocytic nevi of left lower limb, including hip: Secondary | ICD-10-CM | POA: Diagnosis not present

## 2018-05-22 DIAGNOSIS — D485 Neoplasm of uncertain behavior of skin: Secondary | ICD-10-CM | POA: Diagnosis not present

## 2018-05-22 DIAGNOSIS — D2262 Melanocytic nevi of left upper limb, including shoulder: Secondary | ICD-10-CM | POA: Diagnosis not present

## 2018-05-22 DIAGNOSIS — D225 Melanocytic nevi of trunk: Secondary | ICD-10-CM | POA: Diagnosis not present

## 2018-06-03 ENCOUNTER — Ambulatory Visit (INDEPENDENT_AMBULATORY_CARE_PROVIDER_SITE_OTHER): Payer: BLUE CROSS/BLUE SHIELD | Admitting: Psychology

## 2018-06-03 DIAGNOSIS — F418 Other specified anxiety disorders: Secondary | ICD-10-CM | POA: Diagnosis not present

## 2018-06-17 ENCOUNTER — Ambulatory Visit (INDEPENDENT_AMBULATORY_CARE_PROVIDER_SITE_OTHER): Payer: BLUE CROSS/BLUE SHIELD | Admitting: Psychology

## 2018-06-17 DIAGNOSIS — F418 Other specified anxiety disorders: Secondary | ICD-10-CM

## 2018-07-08 ENCOUNTER — Ambulatory Visit (INDEPENDENT_AMBULATORY_CARE_PROVIDER_SITE_OTHER): Payer: BLUE CROSS/BLUE SHIELD | Admitting: Psychology

## 2018-07-08 DIAGNOSIS — F418 Other specified anxiety disorders: Secondary | ICD-10-CM | POA: Diagnosis not present

## 2018-07-17 ENCOUNTER — Encounter: Payer: Self-pay | Admitting: Family Medicine

## 2018-07-17 ENCOUNTER — Ambulatory Visit: Payer: BLUE CROSS/BLUE SHIELD | Admitting: Family Medicine

## 2018-07-17 VITALS — BP 122/80 | HR 88 | Temp 98.2°F | Resp 12 | Ht 63.0 in | Wt 186.4 lb

## 2018-07-17 DIAGNOSIS — E6609 Other obesity due to excess calories: Secondary | ICD-10-CM | POA: Diagnosis not present

## 2018-07-17 DIAGNOSIS — J309 Allergic rhinitis, unspecified: Secondary | ICD-10-CM

## 2018-07-17 DIAGNOSIS — Z6833 Body mass index (BMI) 33.0-33.9, adult: Secondary | ICD-10-CM

## 2018-07-17 DIAGNOSIS — F419 Anxiety disorder, unspecified: Secondary | ICD-10-CM

## 2018-07-17 DIAGNOSIS — M67432 Ganglion, left wrist: Secondary | ICD-10-CM | POA: Diagnosis not present

## 2018-07-17 MED ORDER — CITALOPRAM HYDROBROMIDE 10 MG PO TABS: 10.0000 mg | ORAL_TABLET | Freq: Every day | ORAL | 1 refills | Status: DC

## 2018-07-17 NOTE — Progress Notes (Signed)
HPI:   [name] is a 43 y.o. female, who is here today to follow on anxiety and depression. She was last seen on 04/15/2017. Currently she is on Celexa 10 mg daily.  Medication was started on 03/18/2017. Last visit she reported improvement of symptoms but is still having some, she did not want to change medication dose.  Still tolerating medications well.  Her husband has been home for a few days now and planning on staying for a couple more weeks. This has helped tremendously with stress,she is sleeping better.  Denies suicidal thoughts.  She is following with counselor q 2 weeks,she feels like this has also helped.   Depression screen [name] 2/9 07/17/2018 03/18/2018 11/09/2014  Decreased Interest 1 1 0  Down, Depressed, Hopeless 1 2 1   PHQ - 2 Score 2 3 1   Altered sleeping 1 1 -  Tired, decreased energy 1 2 -  Change in appetite 1 1 -  Feeling bad or failure about yourself  0 1 -  Trouble concentrating 1 0 -  Moving slowly or fidgety/restless 0 0 -  Suicidal thoughts 0 0 -  PHQ-9 Score 6 8 -  Difficult doing work/chores Not difficult at all Somewhat difficult -    She is also complaining about a "cyst" she noted in left wrist around Thanksgiving. Initially was a little bit tender but now asymptomatic. No limitation of range of motion. She has not tried OTC treatments.   Nasal congestion, rhinorrhea, and postnasal drainage. History of allergy rhinitis. She uses Flonase intranasal spray as needed. Negative for sinus pressure, fever, or chills. No sick contact.   Reporting wt gain. She has not been exercising regularly and not following a healthful diet consistently, she is planning on doing so.   Review of Systems  Constitutional: Negative for activity change, appetite change, fatigue and unexpected weight change.  HENT: Positive for congestion, postnasal drip and rhinorrhea. Negative for sore throat and voice change.   Respiratory: Negative for chest  tightness, shortness of breath and wheezing.   Cardiovascular: Negative for chest pain and palpitations.  Gastrointestinal: Negative for abdominal pain, diarrhea, nausea and vomiting.  Allergic/Immunologic: Positive for environmental allergies.  Neurological: Negative for tremors and headaches.  Psychiatric/Behavioral: Negative for confusion, hallucinations and suicidal ideas. The patient is nervous/anxious.       Current Outpatient Medications on File Prior to Visit  Medication Sig Dispense Refill  . albuterol (PROVENTIL HFA) 108 (90 Base) MCG/ACT inhaler Inhale 2 puffs into the lungs every 6 (six) hours as needed for wheezing or shortness of breath. 1 Inhaler 3  . cetirizine (ZYRTEC) 10 MG tablet Take 10 mg by mouth daily.      . Cholecalciferol (VITAMIN D3 PO) Take 2,000 mg by mouth daily.     . LUTEIN PO Take 1 capsule by mouth daily.    . metoCLOPramide (REGLAN) 10 MG tablet Take 1 tablet (10 mg total) by mouth every 6 (six) hours as needed for nausea (nausea/headache). 6 tablet 0  . Multiple Vitamins-Minerals (MULTIVITAMIN WITH MINERALS) tablet Take 1 tablet by mouth daily.    . Omega-3 Fatty Acids (FISH OIL) 1000 MG CAPS Take by mouth.    Marland Kitchen omeprazole (PRILOSEC) 20 MG capsule Take 1 capsule (20 mg total) by mouth daily before breakfast. 30 capsule 1  . ondansetron (ZOFRAN) 4 MG tablet Take 1 tablet (4 mg total) by mouth every 8 (eight) hours as needed for nausea or vomiting. 15 tablet 0  .  Probiotic Product (PROBIOTIC PO) Take 1 tablet by mouth every other day.    . PSYLLIUM HUSK PO Take 2 capsules by mouth daily.    Marland Kitchen SPIRULINA PO Take 1 tablet by mouth daily. For food allergy prevention    . Turmeric Curcumin 500 MG CAPS Take by mouth.    . Triamcinolone Acetonide (TRIAMCINOLONE 0.1 % CREAM : EUCERIN) CREA Apply 1 application topically 2 (two) times daily as needed for rash or itching. (Patient not taking: Reported on 07/17/2018) 500 each 1   No current facility-administered  medications on file prior to visit.      Past Medical History:  Diagnosis Date  . ALLERGIC RHINITIS 05/31/2008  . Anemia   . ANXIETY 05/31/2008  . ASTHMA 05/31/2008  . Asthma   . CHOLELITHIASIS 05/31/2008  . High blood pressure    readings  . History of kidney stones   . History of right breast biopsy 2017  . HYPERLIPIDEMIA 05/31/2008  . Kidney stone   . NEPHROLITHIASIS, HX OF 05/31/2008   Allergies  Allergen Reactions  . Beef-Derived Products Diarrhea    Nausea and vomiting  . Prochlorperazine Edisylate Shortness Of Breath    Compazine Tongue swelling  . Prochlorperazine Edisylate Swelling    Swelling of throat  . Tree Extract Anaphylaxis    Throat itchy, roof of mouth gets raw  . Banana Itching and Nausea And Vomiting    Throat gets itchy  . Beef Extract     Diarrhea and stomach cramps   . Oxycodone-Acetaminophen     Vomiting  . Permethrin Rash    Rash and fever  . Statins Itching  . Morphine And Related Itching  . Other     Tree Nuts (can have peanuts and cashews) Pyrethrine (ingredient in lice products)- fever and rash  . Oxycodone-Acetaminophen Nausea And Vomiting  . Latex Hives and Rash    Rash    Social History   Socioeconomic History  . Marital status: Married    Spouse name: Not on file  . Number of children: 3  . Years of education: BS  . Highest education level: Not on file  Occupational History  . Occupation: Soil scientist  . Occupation: Substitute  Social Needs  . Financial resource strain: Not on file  . Food insecurity:    Worry: Not on file    Inability: Not on file  . Transportation needs:    Medical: Not on file    Non-medical: Not on file  Tobacco Use  . Smoking status: Never Smoker  . Smokeless tobacco: Never Used  Substance and Sexual Activity  . Alcohol use: No  . Drug use: No  . Sexual activity: Yes    Birth control/protection: Surgical  Lifestyle  . Physical activity:    Days per week: Not on file    Minutes per session: Not  on file  . Stress: Not on file  Relationships  . Social connections:    Talks on phone: Not on file    Gets together: Not on file    Attends religious service: Not on file    Active member of club or organization: Not on file    Attends meetings of clubs or organizations: Not on file    Relationship status: Not on file  Other Topics Concern  . Not on file  Social History Narrative   Lives with husband & 3 kids, work at home, PT outside of home. Often volunteers.    Vitals:   07/17/18 7893  BP: 122/80  Pulse: 88  Resp: 12  Temp: 98.2 F (36.8 C)  SpO2: 97%   Body mass index is 33.01 kg/m.  Wt Readings from Last 3 Encounters:  07/17/18 186 lb 6 oz (84.5 kg)  04/15/18 180 lb (81.6 kg)  03/18/18 180 lb 4 oz (81.8 kg)     Physical Exam  Nursing note and vitals reviewed. Constitutional: She is oriented to person, place, and time. She appears well-developed. No distress.  HENT:  Head: Normocephalic.  Nose: Rhinorrhea present. Right sinus exhibits no maxillary sinus tenderness and no frontal sinus tenderness. Left sinus exhibits no maxillary sinus tenderness and no frontal sinus tenderness.  Mouth/Throat: Oropharynx is clear and moist and mucous membranes are normal.  Eyes: Pupils are equal, round, and reactive to light. Conjunctivae are normal.  Cardiovascular: Normal rate and regular rhythm.  No murmur heard. Respiratory: Effort normal and breath sounds normal. No respiratory distress.  Musculoskeletal:        General: No edema.     Left wrist: She exhibits normal range of motion and no tenderness.     Comments: Nodular lesion,about 2-3 cm,soft,no tender in dorsal aspect of left wrist.  Lymphadenopathy:    She has no cervical adenopathy.  Neurological: She is alert and oriented to person, place, and time. She has normal strength. Gait normal.  Skin: Skin is warm. No erythema.  Psychiatric: Her mood appears anxious. Cognition and memory are normal. She expresses no  suicidal ideation. She expresses no suicidal plans.  Fairly groomed,poor eye contact.    ASSESSMENT AND PLAN:  Ms. Naasia was seen today for follow-up.  Diagnoses and all orders for this visit:  Ganglion of left wrist Educated about Dx and treatment options. Since lesion is not causing pain or limiting joint ROM,she would like to monitor for now.  Allergic rhinitis, unspecified seasonality, unspecified trigger Flonase nasal spray and Zyrtec 10 mg daily for a few weeks then back as needed. Nasal saline irrigations several times per day and prn.   Anxiety disorder, unspecified type Problem has improved. No changes in Celexa dose. Continue counseling. Instructed about warning signs. F/U in 6 months,before if needed.  -     citalopram (CELEXA) 10 MG tablet; Take 1 tablet (10 mg total) by mouth daily.  Class 1 obesity due to excess calories with serious comorbidity and body mass index (BMI) of 33.0 to 33.9 in adult Gained about 6 Lb since her last OV. We discussed benefits of wt loss as well as adverse effects of obesity. Consistency with healthy diet and physical activity recommended.    Return in about 6 months (around 01/15/2019) for cpe + f/u (fasting).     Rebeccah Ivins G. Martinique, MD  [facility]. [place] office.

## 2018-07-17 NOTE — Patient Instructions (Signed)
A few things to remember from today's visit:   Anxiety disorder, unspecified type  Ganglion of left wrist  Allergic rhinitis, unspecified seasonality, unspecified trigger Please let me know if you decide to go to orthopedist to treat calcium. No changes in Celexa 10 mg. Continue counseling every 2 weeks.  Recommend starting intranasal steroid spray daily, over-the-counter antihistaminic may also help, and nasal irrigations several times per day.   Please be sure medication list is accurate. If a new problem present, please set up appointment sooner than planned today.

## 2018-07-22 ENCOUNTER — Ambulatory Visit (INDEPENDENT_AMBULATORY_CARE_PROVIDER_SITE_OTHER): Payer: BLUE CROSS/BLUE SHIELD | Admitting: Psychology

## 2018-07-22 DIAGNOSIS — F418 Other specified anxiety disorders: Secondary | ICD-10-CM | POA: Diagnosis not present

## 2018-08-05 ENCOUNTER — Ambulatory Visit (INDEPENDENT_AMBULATORY_CARE_PROVIDER_SITE_OTHER): Payer: BLUE CROSS/BLUE SHIELD | Admitting: Psychology

## 2018-08-05 DIAGNOSIS — F418 Other specified anxiety disorders: Secondary | ICD-10-CM | POA: Diagnosis not present

## 2018-08-19 ENCOUNTER — Ambulatory Visit (INDEPENDENT_AMBULATORY_CARE_PROVIDER_SITE_OTHER): Payer: BLUE CROSS/BLUE SHIELD | Admitting: Psychology

## 2018-08-19 DIAGNOSIS — F418 Other specified anxiety disorders: Secondary | ICD-10-CM | POA: Diagnosis not present

## 2018-09-02 ENCOUNTER — Ambulatory Visit (INDEPENDENT_AMBULATORY_CARE_PROVIDER_SITE_OTHER): Payer: BLUE CROSS/BLUE SHIELD | Admitting: Psychology

## 2018-09-02 DIAGNOSIS — F418 Other specified anxiety disorders: Secondary | ICD-10-CM | POA: Diagnosis not present

## 2018-09-16 ENCOUNTER — Ambulatory Visit (INDEPENDENT_AMBULATORY_CARE_PROVIDER_SITE_OTHER): Payer: BLUE CROSS/BLUE SHIELD | Admitting: Psychology

## 2018-09-16 DIAGNOSIS — F418 Other specified anxiety disorders: Secondary | ICD-10-CM | POA: Diagnosis not present

## 2018-09-30 ENCOUNTER — Ambulatory Visit: Payer: BLUE CROSS/BLUE SHIELD | Admitting: Psychology

## 2018-10-21 ENCOUNTER — Ambulatory Visit (INDEPENDENT_AMBULATORY_CARE_PROVIDER_SITE_OTHER): Payer: BLUE CROSS/BLUE SHIELD | Admitting: Psychology

## 2018-10-21 DIAGNOSIS — F418 Other specified anxiety disorders: Secondary | ICD-10-CM | POA: Diagnosis not present

## 2018-11-04 ENCOUNTER — Ambulatory Visit (INDEPENDENT_AMBULATORY_CARE_PROVIDER_SITE_OTHER): Payer: BLUE CROSS/BLUE SHIELD | Admitting: Psychology

## 2018-11-04 DIAGNOSIS — F418 Other specified anxiety disorders: Secondary | ICD-10-CM | POA: Diagnosis not present

## 2018-11-18 ENCOUNTER — Ambulatory Visit (INDEPENDENT_AMBULATORY_CARE_PROVIDER_SITE_OTHER): Payer: BLUE CROSS/BLUE SHIELD | Admitting: Psychology

## 2018-11-18 DIAGNOSIS — F418 Other specified anxiety disorders: Secondary | ICD-10-CM

## 2018-12-02 ENCOUNTER — Ambulatory Visit (INDEPENDENT_AMBULATORY_CARE_PROVIDER_SITE_OTHER): Payer: BC Managed Care – PPO | Admitting: Psychology

## 2018-12-02 DIAGNOSIS — F418 Other specified anxiety disorders: Secondary | ICD-10-CM

## 2018-12-16 ENCOUNTER — Ambulatory Visit: Payer: BLUE CROSS/BLUE SHIELD | Admitting: Psychology

## 2018-12-30 ENCOUNTER — Ambulatory Visit (INDEPENDENT_AMBULATORY_CARE_PROVIDER_SITE_OTHER): Payer: BC Managed Care – PPO | Admitting: Psychology

## 2018-12-30 DIAGNOSIS — F418 Other specified anxiety disorders: Secondary | ICD-10-CM

## 2019-01-13 ENCOUNTER — Ambulatory Visit (INDEPENDENT_AMBULATORY_CARE_PROVIDER_SITE_OTHER): Payer: BC Managed Care – PPO | Admitting: Psychology

## 2019-01-13 DIAGNOSIS — F418 Other specified anxiety disorders: Secondary | ICD-10-CM

## 2019-01-15 ENCOUNTER — Ambulatory Visit (INDEPENDENT_AMBULATORY_CARE_PROVIDER_SITE_OTHER): Payer: BC Managed Care – PPO | Admitting: Family Medicine

## 2019-01-15 ENCOUNTER — Encounter: Payer: Self-pay | Admitting: Family Medicine

## 2019-01-15 ENCOUNTER — Other Ambulatory Visit: Payer: Self-pay

## 2019-01-15 VITALS — BP 120/78 | HR 77 | Temp 98.6°F | Resp 12 | Ht 63.0 in | Wt 189.2 lb

## 2019-01-15 DIAGNOSIS — Z0000 Encounter for general adult medical examination without abnormal findings: Secondary | ICD-10-CM

## 2019-01-15 DIAGNOSIS — E559 Vitamin D deficiency, unspecified: Secondary | ICD-10-CM

## 2019-01-15 DIAGNOSIS — G4700 Insomnia, unspecified: Secondary | ICD-10-CM | POA: Diagnosis not present

## 2019-01-15 DIAGNOSIS — Z13228 Encounter for screening for other metabolic disorders: Secondary | ICD-10-CM | POA: Diagnosis not present

## 2019-01-15 DIAGNOSIS — F419 Anxiety disorder, unspecified: Secondary | ICD-10-CM

## 2019-01-15 DIAGNOSIS — Z130 Encounter for screening for diseases of the blood and blood-forming organs and certain disorders involving the immune mechanism: Secondary | ICD-10-CM

## 2019-01-15 DIAGNOSIS — E782 Mixed hyperlipidemia: Secondary | ICD-10-CM | POA: Diagnosis not present

## 2019-01-15 DIAGNOSIS — Z1329 Encounter for screening for other suspected endocrine disorder: Secondary | ICD-10-CM | POA: Diagnosis not present

## 2019-01-15 LAB — BASIC METABOLIC PANEL
BUN: 13 mg/dL (ref 6–23)
CO2: 27 mEq/L (ref 19–32)
Calcium: 9.4 mg/dL (ref 8.4–10.5)
Chloride: 102 mEq/L (ref 96–112)
Creatinine, Ser: 0.77 mg/dL (ref 0.40–1.20)
GFR: 82.14 mL/min (ref >60.00)
Glucose, Bld: 94 mg/dL (ref 70–99)
Potassium: 4.4 mEq/L (ref 3.5–5.1)
Sodium: 138 mEq/L (ref 135–145)

## 2019-01-15 LAB — HEMOGLOBIN A1C: Hgb A1c MFr Bld: 5.8 % (ref 4.6–6.5)

## 2019-01-15 LAB — VITAMIN D 25 HYDROXY (VIT D DEFICIENCY, FRACTURES): VITD: 33.61 ng/mL (ref 30.00–100.00)

## 2019-01-15 LAB — LIPID PANEL
Cholesterol: 257 mg/dL — ABNORMAL HIGH (ref 0–200)
HDL: 38.5 mg/dL — ABNORMAL LOW (ref >39.00)
NonHDL: 218.92
Total CHOL/HDL Ratio: 7
Triglycerides: 252 mg/dL — ABNORMAL HIGH (ref 0.0–149.0)
VLDL: 50.4 mg/dL — ABNORMAL HIGH (ref 0.0–40.0)

## 2019-01-15 LAB — LDL CHOLESTEROL, DIRECT: Direct LDL: 172 mg/dL

## 2019-01-15 NOTE — Assessment & Plan Note (Signed)
Continue Celexa 10 mg daily. Continue following with psychotherapist.

## 2019-01-15 NOTE — Assessment & Plan Note (Signed)
For now continue low fat diet. Further recommendations will be given according with 10 years CVD risk and lipid panel results.

## 2019-01-15 NOTE — Patient Instructions (Signed)
.  A few things to remember from today's visit:   Routine general medical examination at a health care facility  Hyperlipemia, mixed - Plan: Lipid panel  Vitamin D deficiency - Plan: VITAMIN D 25 Hydroxy (Vit-D Deficiency, Fractures)  Screening for endocrine, metabolic and immunity disorder - Plan: Hemoglobin A5W, Basic metabolic panel  Anxiety disorder, unspecified type  Insomnia, unspecified type  Today you have you routine preventive visit.  At least 150 minutes of moderate exercise per week, daily brisk walking for 15-30 min is a good exercise option. Healthy diet low in saturated (animal) fats and sweets and consisting of fresh fruits and vegetables, lean meats such as fish and white chicken and whole grains.  These are some of recommendations for screening depending of age and risk factors:  Continue over-the-counter melatonin for sleep. No changes in Celexa. Continue following with psychotherapist.  I think as far as everything is is stable, I can see you back in 1 year. - Vaccines:  Tdap vaccine every 10 years.  Shingles vaccine recommended at age 22, could be given after 42 years of age but not sure about insurance coverage.   Pneumonia vaccines:  Prevnar 13 at 65 and Pneumovax at 27. Sometimes Pneumovax is giving earlier if history of smoking, lung disease,diabetes,kidney disease among some.    Screening for diabetes at age 4 and every 3 years.  Cervical cancer prevention:  Pap smear starts at 41 years of age and continues periodically until 42 years old in low risk women. Pap smear every 3 years between 7 and 25 years old. Pap smear every 3-5 years between women 16 and older if pap smear negative and HPV screening negative.   -Breast cancer: Mammogram: There is disagreement between experts about when to start screening in low risk asymptomatic female but recent recommendations are to start screening at 44 and not later than 42 years old , every 1-2 years and  after 42 yo q 2 years. Screening is recommended until 42 years old but some women can continue screening depending of healthy issues.   Colon cancer screening: starts at 42 years old until 42 years old.  Also recommended:  1. Dental visit- Brush and floss your teeth twice daily; visit your dentist twice a year. 2. Eye doctor- Get an eye exam at least every 2 years. 3. Helmet use- Always wear a helmet when riding a bicycle, motorcycle, rollerblading or skateboarding. 4. Safe sex- If you may be exposed to sexually transmitted infections, use a condom. 5. Seat belts- Seat belts can save your live; always wear one. 6. Smoke/Carbon Monoxide detectors- These detectors need to be installed on the appropriate level of your home. Replace batteries at least once a year. 7. Skin cancer- When out in the sun please cover up and use sunscreen 15 SPF or higher. 8. Violence- If anyone is threatening or hurting you, please tell your healthcare provider.  9. Drink alcohol in moderation- Limit alcohol intake to one drink or less per day. Never drink and drive.  Please be sure medication list is accurate. If a new problem present, please set up appointment sooner than planned today.

## 2019-01-15 NOTE — Progress Notes (Signed)
HPI:   [name] is a 43 y.o. female, who is here today for her routine physical.  Last CPE: 2016. Her last gyn preventive visit on 03/12/18.  Regular exercise 3 or more time per week: Walking 2-3 times per day, hiking a few times per week. Following a healthy diet: She has not been consistent since 08/2018, she was doing very well before COVID 19 pandemia. It is hard to follow a healthful diet with her children, eating more "junk food."  She lives with her husband and 3 children. Her husband usually travels several times per months but has been home since 08/2018 due to [place] 19 pandemia.  Chronic medical problems: Depression,anxiety,nephrolithiasis,vit D deficiency,and HLD among some.  Pap smear 03/2018  Immunization History  Administered Date(s) Administered  . Hepatitis A 08/22/2009, 01/08/2010  . Influenza,inj,Quad PF,6+ Mos 05/07/2016, 05/07/2017  . Tdap 03/31/2006, 11/04/2016    Mammogram: 01/2018 at [place] pt report.    HLD: She is on non pharmacologic treatment. Has refused statin meds.  Lab Results  Component Value Date   CHOL 209 (H) 09/09/2017   HDL 36.80 (L) 09/09/2017   LDLCALC 162 (H) 05/07/2016   LDLDIRECT 121.0 09/09/2017   TRIG 203.0 (H) 09/09/2017   CHOLHDL 6 09/09/2017   Prediabetes: Lab Results  Component Value Date   HGBA1C 5.9 05/07/2016  Denies abdominal pain, nausea,vomiting, polydipsia,polyuria, or polyphagia.   Lab Results  Component Value Date   CREATININE 0.76 03/18/2018   BUN 10 03/18/2018   NA 137 03/18/2018   K 4.3 03/18/2018   CL 100 03/18/2018   CO2 30 03/18/2018   Anxiety and mild depression,she is on Celexa 10 mg daily. She feels like she is dealing with stress better but still has some symptoms around her menstrual period.  One middle daughter has "bad" anxiety,she follows with psychiatrist;this problem aggravated hers. She denies suicidal thoughts. Tolerating medication well. Insomnia,sleeping < 6  hours. During menses she does not sleep at all x 1 day.  OTC Melatonin helped,took it for 3 days. She feels "weird" next day and caused some irritability.  + Fatigue. "Upset stomach" around menstrual periods. All these symptoms have been going on for years. States that she tried prescriptions meds in the past but did not help. Following with psychotherapist q 2 weeks.   Vit D deficiency,she is on Vit D3 2000 U daily. 2018 her 25 OH vit D was 37.8.   Review of Systems  Constitutional: Negative for appetite change, fatigue and fever.  HENT: Positive for postnasal drip and rhinorrhea. Negative for dental problem, hearing loss, mouth sores, sore throat, trouble swallowing and voice change.   Eyes: Negative for redness and visual disturbance.  Respiratory: Negative for cough, shortness of breath and wheezing.   Cardiovascular: Negative for chest pain and leg swelling.  Gastrointestinal: Negative for abdominal pain, nausea and vomiting.       No changes in bowel habits.  Endocrine: Negative for cold intolerance, heat intolerance, polydipsia, polyphagia and polyuria.  Genitourinary: Negative for decreased urine volume, dysuria, hematuria, vaginal bleeding and vaginal discharge.  Musculoskeletal: Negative for arthralgias, gait problem and myalgias.  Skin: Negative for color change and rash.  Allergic/Immunologic: Positive for environmental allergies.  Neurological: Negative for syncope, weakness and headaches.  Hematological: Negative for adenopathy. Does not bruise/bleed easily.  Psychiatric/Behavioral: Positive for sleep disturbance. Negative for confusion. The patient is nervous/anxious.   All other systems reviewed and are negative.  Current Outpatient Medications on File Prior to  Visit  Medication Sig Dispense Refill  . albuterol (PROVENTIL HFA) 108 (90 Base) MCG/ACT inhaler Inhale 2 puffs into the lungs every 6 (six) hours as needed for wheezing or shortness of breath. 1 Inhaler 3   . cetirizine (ZYRTEC) 10 MG tablet Take 10 mg by mouth daily.      . Cholecalciferol (VITAMIN D3 PO) Take 2,000 mg by mouth daily.     . LUTEIN PO Take 1 capsule by mouth daily.    . metoCLOPramide (REGLAN) 10 MG tablet Take 1 tablet (10 mg total) by mouth every 6 (six) hours as needed for nausea (nausea/headache). 6 tablet 0  . Multiple Vitamins-Minerals (MULTIVITAMIN WITH MINERALS) tablet Take 1 tablet by mouth daily.    . Omega-3 Fatty Acids (FISH OIL) 1000 MG CAPS Take by mouth.    Marland Kitchen omeprazole (PRILOSEC) 20 MG capsule Take 1 capsule (20 mg total) by mouth daily before breakfast. 30 capsule 1  . Probiotic Product (PROBIOTIC PO) Take 1 tablet by mouth every other day.    . PSYLLIUM HUSK PO Take 2 capsules by mouth daily.    Marland Kitchen SPIRULINA PO Take 1 tablet by mouth daily. For food allergy prevention    . Triamcinolone Acetonide (TRIAMCINOLONE 0.1 % CREAM : EUCERIN) CREA Apply 1 application topically 2 (two) times daily as needed for rash or itching. 500 each 1  . Turmeric Curcumin 500 MG CAPS Take by mouth.     No current facility-administered medications on file prior to visit.      Past Medical History:  Diagnosis Date  . ALLERGIC RHINITIS 05/31/2008  . Anemia   . ANXIETY 05/31/2008  . ASTHMA 05/31/2008  . Asthma   . CHOLELITHIASIS 05/31/2008  . High blood pressure    readings  . History of kidney stones   . History of right breast biopsy 2017  . HYPERLIPIDEMIA 05/31/2008  . Kidney stone   . NEPHROLITHIASIS, HX OF 05/31/2008    Past Surgical History:  Procedure Laterality Date  . CESAREAN SECTION  [phone]  . CHOLECYSTECTOMY  1999  . KIDNEY STONE SURGERY    . ORIF CONGENITAL HIP DISLOCATION      Allergies  Allergen Reactions  . Beef-Derived Products Diarrhea    Nausea and vomiting  . Prochlorperazine Edisylate Shortness Of Breath    Compazine Tongue swelling  . Prochlorperazine Edisylate Swelling    Swelling of throat  . Tree Extract Anaphylaxis    Throat itchy,  roof of mouth gets raw  . Banana Itching and Nausea And Vomiting    Throat gets itchy  . Beef Extract     Diarrhea and stomach cramps   . Oxycodone-Acetaminophen     Vomiting  . Permethrin Rash    Rash and fever  . Statins Itching  . Morphine And Related Itching  . Other     Tree Nuts (can have peanuts and cashews) Pyrethrine (ingredient in lice products)- fever and rash  . Oxycodone-Acetaminophen Nausea And Vomiting  . Latex Hives and Rash    Rash    Family History  Problem Relation Age of Onset  . Breast cancer Mother 37  . Arthritis Mother   . Hyperlipidemia Father        triglycerides  . Mental illness Brother        bipolar  . Asthma Brother   . Allergic Disorder Daughter   . Cancer Maternal Uncle        mult myeloma  . Vascular Disease Maternal Uncle   .  Heart disease Paternal Grandfather     Social History   Socioeconomic History  . Marital status: Married    Spouse name: Not on file  . Number of children: 3  . Years of education: BS  . Highest education level: Not on file  Occupational History  . Occupation: Soil scientist  . Occupation: Substitute  Social Needs  . Financial resource strain: Not on file  . Food insecurity    Worry: Not on file    Inability: Not on file  . Transportation needs    Medical: Not on file    Non-medical: Not on file  Tobacco Use  . Smoking status: Never Smoker  . Smokeless tobacco: Never Used  Substance and Sexual Activity  . Alcohol use: No  . Drug use: No  . Sexual activity: Yes    Birth control/protection: Surgical  Lifestyle  . Physical activity    Days per week: Not on file    Minutes per session: Not on file  . Stress: Not on file  Relationships  . Social Herbalist on phone: Not on file    Gets together: Not on file    Attends religious service: Not on file    Active member of club or organization: Not on file    Attends meetings of clubs or organizations: Not on file    Relationship status: Not on  file  Other Topics Concern  . Not on file  Social History Narrative   Lives with husband & 3 kids, work at home, PT outside of home. Often volunteers.    Vitals:   01/15/19 0915  BP: 120/78  Pulse: 77  Resp: 12  Temp: 98.6 F (37 C)  SpO2: 99%   Body mass index is 33.52 kg/m.   Wt Readings from Last 3 Encounters:  01/15/19 189 lb 4 oz (85.8 kg)  07/17/18 186 lb 6 oz (84.5 kg)  04/15/18 180 lb (81.6 kg)    Physical Exam  Nursing note and vitals reviewed. Constitutional: She is oriented to person, place, and time. She appears well-developed. No distress.  HENT:  Head: Normocephalic and atraumatic.  Right Ear: Hearing, tympanic membrane, external ear and ear canal normal.  Left Ear: Hearing, tympanic membrane, external ear and ear canal normal.  Mouth/Throat: Uvula is midline, oropharynx is clear and moist and mucous membranes are normal.  Eyes: Pupils are equal, round, and reactive to light. Conjunctivae and EOM are normal.  Neck: No tracheal deviation present. No thyromegaly present.  Cardiovascular: Normal rate and regular rhythm.  No murmur heard. Pulses:      Dorsalis pedis pulses are 2+ on the right side and 2+ on the left side.  Respiratory: Effort normal and breath sounds normal. No respiratory distress.  GI: Soft. She exhibits no mass. There is no hepatomegaly. There is no abdominal tenderness.  Genitourinary:    Genitourinary Comments: Deferred to gyn.   Musculoskeletal:        General: No edema.     Comments: No major deformity or signs of synovitis appreciated.  Lymphadenopathy:    She has no cervical adenopathy.       Right: No supraclavicular adenopathy present.       Left: No supraclavicular adenopathy present.  Neurological: She is alert and oriented to person, place, and time. She has normal strength. No cranial nerve deficit. Coordination and gait normal.  Reflex Scores:      Bicep reflexes are 2+ on the right side and 2+ on  the left side.       Patellar reflexes are 2+ on the right side and 2+ on the left side. Skin: Skin is warm. No rash noted. No erythema.  Psychiatric: Her speech is normal. Her mood appears anxious. Cognition and memory are normal.  Well groomed, good eye contact.    ASSESSMENT AND PLAN:  Ms. Angela Harrell was here today annual physical examination.   Orders Placed This Encounter  Procedures  . Hemoglobin A1c  . Lipid panel  . Basic metabolic panel  . VITAMIN D 25 Hydroxy (Vit-D Deficiency, Fractures)  . LDL cholesterol, direct   Lab Results  Component Value Date   CHOL 257 (H) 01/15/2019   HDL 38.50 (L) 01/15/2019   LDLCALC 162 (H) 05/07/2016   LDLDIRECT 172.0 01/15/2019   TRIG 252.0 (H) 01/15/2019   CHOLHDL 7 01/15/2019   Lab Results  Component Value Date   CREATININE 0.77 01/15/2019   BUN 13 01/15/2019   NA 138 01/15/2019   K 4.4 01/15/2019   CL 102 01/15/2019   CO2 27 01/15/2019    Lab Results  Component Value Date   HGBA1C 5.8 01/15/2019    Routine general medical examination at a health care facility We discussed the importance of regular physical activity and healthy diet for prevention of chronic illness and/or complications. Preventive guidelines reviewed. Vaccination up to date. Continue following with gyn for her female preventive care. Next CPE in a year.  The 10-year ASCVD risk score Mikey Bussing DC Brooke Bonito., et al., 2013) is: 1.8%   Values used to calculate the score:     Age: 87 years     Sex: Female     Is Non-Hispanic African American: No     Diabetic: No     Tobacco smoker: No     Systolic Blood Pressure: 335 mmHg     Is BP treated: No     HDL Cholesterol: 38.5 mg/dL     Total Cholesterol: 257 mg/dL  Vitamin D deficiency No changes in current management, will follow 25 OH vit D done today and will give further recommendations accordingly.  -     VITAMIN D 25 Hydroxy (Vit-D Deficiency, Fractures)  Screening for endocrine, metabolic and immunity disorder -      Hemoglobin A1c -     Basic metabolic panel  Hyperlipemia, mixed For now continue low fat diet. Further recommendations will be given according with 10 years CVD risk and lipid panel results.  Insomnia She is not interested in prescription sleep aid. Continue Melatonin 3-5 mg at bedtime. Good sleep hygiene.   Anxiety disorder, unspecified Continue Celexa 10 mg daily. Continue following with psychotherapist.     Return in 1 year (on 01/15/2020) for cpe and f/u.    [name]. Martinique, MD  [facility]. [place] office.

## 2019-01-15 NOTE — Assessment & Plan Note (Signed)
She is not interested in prescription sleep aid. Continue Melatonin 3-5 mg at bedtime. Good sleep hygiene.

## 2019-01-19 MED ORDER — CITALOPRAM HYDROBROMIDE 10 MG PO TABS: 10.0000 mg | ORAL_TABLET | Freq: Every day | ORAL | 3 refills | Status: DC

## 2019-01-21 ENCOUNTER — Telehealth: Payer: Self-pay | Admitting: Family Medicine

## 2019-01-21 NOTE — Telephone Encounter (Signed)
Copied from [place] [id number]. Topic: Quick Communication - See Telephone Encounter >> Jan 21, 2019  1:13 PM Nils Flack wrote: CRM for notification. See Telephone encounter for: 01/21/19. Pt called back, she said that she probably will not pick up a statin from the pharmacy because she has had problems with them in the past. Pt has added more exercise and has made changes to her diet.  Please call to discuss further

## 2019-01-22 NOTE — Telephone Encounter (Signed)
Spoke with patient and she stated that the last time she was on Simvastatin she had bad reaction with constant itching all the time. Patient has declined medication, any statin medication, and will work on her diet. Patient stated that since all of this has happened she has not been eating or exercising properly, but will start again to keep her numbers down.  Copied from [place] [id number]. Topic: Quick Communication - Lab Results (Clinic Use ONLY) >> Jan 19, 2019  3:58 PM Zacarias Pontes, CMA wrote: Called patient to inform them of their lab results. When patient returns call, triage nurse may disclose results.

## 2019-01-27 ENCOUNTER — Ambulatory Visit (INDEPENDENT_AMBULATORY_CARE_PROVIDER_SITE_OTHER): Payer: BC Managed Care – PPO | Admitting: Psychology

## 2019-01-27 DIAGNOSIS — F418 Other specified anxiety disorders: Secondary | ICD-10-CM

## 2019-02-10 ENCOUNTER — Ambulatory Visit (INDEPENDENT_AMBULATORY_CARE_PROVIDER_SITE_OTHER): Payer: BC Managed Care – PPO | Admitting: Psychology

## 2019-02-10 DIAGNOSIS — F418 Other specified anxiety disorders: Secondary | ICD-10-CM | POA: Diagnosis not present

## 2019-02-12 DIAGNOSIS — Z803 Family history of malignant neoplasm of breast: Secondary | ICD-10-CM | POA: Diagnosis not present

## 2019-02-12 DIAGNOSIS — Z1231 Encounter for screening mammogram for malignant neoplasm of breast: Secondary | ICD-10-CM | POA: Diagnosis not present

## 2019-02-23 DIAGNOSIS — N6489 Other specified disorders of breast: Secondary | ICD-10-CM | POA: Diagnosis not present

## 2019-02-24 ENCOUNTER — Ambulatory Visit (INDEPENDENT_AMBULATORY_CARE_PROVIDER_SITE_OTHER): Payer: BC Managed Care – PPO | Admitting: Psychology

## 2019-02-24 DIAGNOSIS — F418 Other specified anxiety disorders: Secondary | ICD-10-CM

## 2019-03-10 ENCOUNTER — Ambulatory Visit (INDEPENDENT_AMBULATORY_CARE_PROVIDER_SITE_OTHER): Payer: BC Managed Care – PPO | Admitting: Psychology

## 2019-03-10 ENCOUNTER — Ambulatory Visit: Payer: BC Managed Care – PPO | Admitting: Psychology

## 2019-03-10 DIAGNOSIS — F418 Other specified anxiety disorders: Secondary | ICD-10-CM

## 2019-03-24 ENCOUNTER — Ambulatory Visit: Payer: BC Managed Care – PPO | Admitting: Psychology

## 2019-04-07 ENCOUNTER — Ambulatory Visit (INDEPENDENT_AMBULATORY_CARE_PROVIDER_SITE_OTHER): Payer: BC Managed Care – PPO | Admitting: Psychology

## 2019-04-07 DIAGNOSIS — F418 Other specified anxiety disorders: Secondary | ICD-10-CM

## 2019-04-21 ENCOUNTER — Ambulatory Visit (INDEPENDENT_AMBULATORY_CARE_PROVIDER_SITE_OTHER): Payer: BC Managed Care – PPO | Admitting: Psychology

## 2019-04-21 DIAGNOSIS — F418 Other specified anxiety disorders: Secondary | ICD-10-CM

## 2019-05-05 ENCOUNTER — Ambulatory Visit (INDEPENDENT_AMBULATORY_CARE_PROVIDER_SITE_OTHER): Payer: BC Managed Care – PPO | Admitting: Psychology

## 2019-05-05 DIAGNOSIS — F418 Other specified anxiety disorders: Secondary | ICD-10-CM

## 2019-05-06 DIAGNOSIS — Z01419 Encounter for gynecological examination (general) (routine) without abnormal findings: Secondary | ICD-10-CM | POA: Diagnosis not present

## 2019-05-06 DIAGNOSIS — N6452 Nipple discharge: Secondary | ICD-10-CM | POA: Diagnosis not present

## 2019-05-06 DIAGNOSIS — Z6833 Body mass index (BMI) 33.0-33.9, adult: Secondary | ICD-10-CM | POA: Diagnosis not present

## 2019-05-06 DIAGNOSIS — Z1329 Encounter for screening for other suspected endocrine disorder: Secondary | ICD-10-CM | POA: Diagnosis not present

## 2019-05-19 ENCOUNTER — Ambulatory Visit (INDEPENDENT_AMBULATORY_CARE_PROVIDER_SITE_OTHER): Payer: BC Managed Care – PPO | Admitting: Psychology

## 2019-05-19 DIAGNOSIS — F418 Other specified anxiety disorders: Secondary | ICD-10-CM

## 2019-06-02 ENCOUNTER — Ambulatory Visit (INDEPENDENT_AMBULATORY_CARE_PROVIDER_SITE_OTHER): Payer: BC Managed Care – PPO | Admitting: Psychology

## 2019-06-02 DIAGNOSIS — F418 Other specified anxiety disorders: Secondary | ICD-10-CM

## 2019-06-08 ENCOUNTER — Other Ambulatory Visit: Payer: Self-pay

## 2019-06-09 ENCOUNTER — Ambulatory Visit: Payer: BC Managed Care – PPO | Admitting: Family Medicine

## 2019-06-09 ENCOUNTER — Ambulatory Visit: Payer: BC Managed Care – PPO

## 2019-06-09 ENCOUNTER — Encounter: Payer: Self-pay | Admitting: Family Medicine

## 2019-06-09 ENCOUNTER — Ambulatory Visit (INDEPENDENT_AMBULATORY_CARE_PROVIDER_SITE_OTHER): Payer: BC Managed Care – PPO

## 2019-06-09 VITALS — BP 136/80 | HR 83 | Resp 12 | Ht 63.0 in | Wt 193.2 lb

## 2019-06-09 DIAGNOSIS — R1032 Left lower quadrant pain: Secondary | ICD-10-CM

## 2019-06-09 DIAGNOSIS — R319 Hematuria, unspecified: Secondary | ICD-10-CM

## 2019-06-09 DIAGNOSIS — F419 Anxiety disorder, unspecified: Secondary | ICD-10-CM | POA: Diagnosis not present

## 2019-06-09 DIAGNOSIS — N390 Urinary tract infection, site not specified: Secondary | ICD-10-CM | POA: Diagnosis not present

## 2019-06-09 DIAGNOSIS — N200 Calculus of kidney: Secondary | ICD-10-CM | POA: Diagnosis not present

## 2019-06-09 DIAGNOSIS — R310 Gross hematuria: Secondary | ICD-10-CM

## 2019-06-09 LAB — POCT URINALYSIS DIPSTICK
Bilirubin, UA: NEGATIVE
Glucose, UA: NEGATIVE
Ketones, UA: NEGATIVE
Nitrite, UA: NEGATIVE
Protein, UA: NEGATIVE
Spec Grav, UA: 1.01 (ref 1.010–1.025)
Urobilinogen, UA: 0.2 E.U./dL
pH, UA: 6.5 (ref 5.0–8.0)

## 2019-06-09 MED ORDER — NITROFURANTOIN MONOHYD MACRO 100 MG PO CAPS: 100.0000 mg | ORAL_CAPSULE | Freq: Two times a day (BID) | ORAL | 0 refills | Status: AC

## 2019-06-09 NOTE — Progress Notes (Signed)
HPI:  Chief Complaint  Patient presents with  . Hematuria    [name] is a 43 y.o. female, who is here today complaining of 2-3 days of urinary symptoms.  Started with urinary frequency and urgency. Dysuria: Yes,at the end of urination, "discomfort." Incontinence: Negative. Gross hematuria: Yes, started yesterday.Small cloths.Hx of nephrolithiasis, she has not had her typical symptoms. Nocturia x 2. Negative for fever, chills, change in appetite, or unusual fatigue.  Abdominal pain: For about 2 weeks intermittent,dull, mild LLQ." Pain is not radiated. She has not identified exacerbating or alleviating factors. Monday she had loose stools but she is not sure if it is related to urinary symptoms.  Nausea or vomiting: Negative.  Abnormal vaginal bleeding or discharge: Negative.  LMP:05/26/19/ Sexual activity. Hx of UTI: N/A  OTC medications for this problem: None.  She lives with her husband and their 3 teens daughter.She going through some stress, home schooling, children dealing with anxiety. Negative for depressed mood. Hx of anxiety, she was prescribed Celexa 10 mg daily.  Review of Systems  Constitutional: Negative for activity change, appetite change, fatigue and fever.  HENT: Negative for mouth sores and sore throat.   Gastrointestinal: Negative for abdominal distention, blood in stool and vomiting.  Genitourinary: Negative for decreased urine volume, flank pain and menstrual problem.  Musculoskeletal: Negative for back pain and myalgias.  Skin: Negative for rash and wound.  Neurological: Negative for syncope and weakness.  Rest see pertinent positives and negatives per HPI.   Current Outpatient Medications on File Prior to Visit  Medication Sig Dispense Refill  . albuterol (PROVENTIL HFA) 108 (90 Base) MCG/ACT inhaler Inhale 2 puffs into the lungs every 6 (six) hours as needed for wheezing or shortness of breath. 1 Inhaler 3  . cetirizine (ZYRTEC)  10 MG tablet Take 10 mg by mouth daily.      . Cholecalciferol (VITAMIN D3 PO) Take 2,000 mg by mouth daily.     . citalopram (CELEXA) 10 MG tablet Take 1 tablet (10 mg total) by mouth daily. 90 tablet 3  . LUTEIN PO Take 1 capsule by mouth daily.    . metoCLOPramide (REGLAN) 10 MG tablet Take 1 tablet (10 mg total) by mouth every 6 (six) hours as needed for nausea (nausea/headache). 6 tablet 0  . Multiple Vitamins-Minerals (MULTIVITAMIN WITH MINERALS) tablet Take 1 tablet by mouth daily.    . Omega-3 Fatty Acids (FISH OIL) 1000 MG CAPS Take by mouth.    Marland Kitchen omeprazole (PRILOSEC) 20 MG capsule Take 1 capsule (20 mg total) by mouth daily before breakfast. 30 capsule 1  . Probiotic Product (PROBIOTIC PO) Take 1 tablet by mouth every other day.    . PSYLLIUM HUSK PO Take 2 capsules by mouth daily.    Marland Kitchen SPIRULINA PO Take 1 tablet by mouth daily. For food allergy prevention    . Triamcinolone Acetonide (TRIAMCINOLONE 0.1 % CREAM : EUCERIN) CREA Apply 1 application topically 2 (two) times daily as needed for rash or itching. 500 each 1  . Turmeric Curcumin 500 MG CAPS Take by mouth.     No current facility-administered medications on file prior to visit.     Past Medical History:  Diagnosis Date  . ALLERGIC RHINITIS 05/31/2008  . Anemia   . ANXIETY 05/31/2008  . ASTHMA 05/31/2008  . Asthma   . CHOLELITHIASIS 05/31/2008  . High blood pressure    readings  . History of kidney stones   . History of  right breast biopsy 2017  . HYPERLIPIDEMIA 05/31/2008  . Kidney stone   . NEPHROLITHIASIS, HX OF 05/31/2008   Allergies  Allergen Reactions  . Beef-Derived Products Diarrhea    Nausea and vomiting  . Prochlorperazine Edisylate Shortness Of Breath    Compazine Tongue swelling  . Prochlorperazine Edisylate Swelling    Swelling of throat  . Tree Extract Anaphylaxis    Throat itchy, roof of mouth gets raw  . Banana Itching and Nausea And Vomiting    Throat gets itchy  . Beef (Bovine) Protein      Diarrhea and stomach cramps   . Oxycodone-Acetaminophen     Vomiting  . Permethrin Rash    Rash and fever  . Statins Itching  . Morphine And Related Itching  . Other     Tree Nuts (can have peanuts and cashews) Pyrethrine (ingredient in lice products)- fever and rash  . Oxycodone-Acetaminophen Nausea And Vomiting  . Latex Hives and Rash    Rash    Social History   Socioeconomic History  . Marital status: Married    Spouse name: Not on file  . Number of children: 3  . Years of education: BS  . Highest education level: Not on file  Occupational History  . Occupation: Soil scientist  . Occupation: Substitute  Tobacco Use  . Smoking status: Never Smoker  . Smokeless tobacco: Never Used  Substance and Sexual Activity  . Alcohol use: No  . Drug use: No  . Sexual activity: Yes    Birth control/protection: Surgical  Other Topics Concern  . Not on file  Social History Narrative   Lives with husband & 3 kids, work at home, PT outside of home. Often volunteers.   Social Determinants of Health   Financial Resource Strain:   . Difficulty of Paying Living Expenses: Not on file  Food Insecurity:   . Worried About Charity fundraiser in the Last Year: Not on file  . Ran Out of Food in the Last Year: Not on file  Transportation Needs:   . Lack of Transportation (Medical): Not on file  . Lack of Transportation (Non-Medical): Not on file  Physical Activity:   . Days of Exercise per Week: Not on file  . Minutes of Exercise per Session: Not on file  Stress:   . Feeling of Stress : Not on file  Social Connections:   . Frequency of Communication with Friends and Family: Not on file  . Frequency of Social Gatherings with Friends and Family: Not on file  . Attends Religious Services: Not on file  . Active Member of Clubs or Organizations: Not on file  . Attends Archivist Meetings: Not on file  . Marital Status: Not on file    Vitals:   06/09/19 0839  BP: 136/80  Pulse: 83   Resp: 12  SpO2: 97%   Body mass index is 34.23 kg/m.   Physical Exam  Nursing note and vitals reviewed. Constitutional: She is oriented to person, place, and time. She appears well-developed. No distress.  HENT:  Head: Normocephalic and atraumatic.  Eyes: Conjunctivae are normal.  Cardiovascular: Normal rate and regular rhythm.  Respiratory: Effort normal and breath sounds normal. No respiratory distress.  GI: Soft. She exhibits no mass. There is no hepatomegaly. There is abdominal tenderness in the left lower quadrant. There is no rigidity, no rebound, no guarding and no CVA tenderness.    Musculoskeletal:        General: No edema.  Lumbar back: No tenderness or bony tenderness.  Neurological: She is alert and oriented to person, place, and time. She has normal strength. Gait normal.  Skin: Skin is warm. No rash noted. No erythema.  Psychiatric: Her mood appears anxious.  Well groomed, poor eye contact.    ASSESSMENT AND PLAN:  Ms. Angela Harrell was seen today for hematuria.  Diagnoses and all orders for this visit: Orders Placed This Encounter  Procedures  . Urine culture  . DG Abd 1 View  . POC Urinalysis Dipstick    Gross hematuria We discussed possible etiologies. Urine dipstick is not very suggestive of UTI: 2+ blood,minimal leuk,and neg nitrate. Ucx was not done, urine was discarded before order was placed.  Nephrolithiasis This could be causing above problem. KUB done today,I do not appreciate kidney stones, +phleboliths seem,mild stool amount in colon. Pending formal report. Urine strainer given. We may need renal CT and/or urology referral if hematuria is persistent.  Urinary tract infection with hematuria, site unspecified Will start empiric abx treatment. If symptoms do not resolve we will need to collect another urine sample.  Clearly instructed about warning signs.  -     nitrofurantoin, macrocrystal-monohydrate, (MACROBID) 100 MG capsule; Take 1  capsule (100 mg total) by mouth 2 (two) times daily for 5 days. -     DG Abd 1 View; Future -     Urine culture  Anxiety disorder, unspecified type No changes in current management. She seems to be dealing well with stress. We could increase Celexa dose if problem worsens.  LLQ pain Mild. Could be related to above problems. Gyn etiology also to be considered. I do not think further blood work up or imaging is needed at this time. If pain is worse or she starts with N/V,fever she needs to seek immediate medical evaluation.   No follow-ups on file.   [name]. Martinique, MD  [facility]. [place] office.

## 2019-06-13 ENCOUNTER — Encounter: Payer: Self-pay | Admitting: Family Medicine

## 2019-06-16 ENCOUNTER — Ambulatory Visit (INDEPENDENT_AMBULATORY_CARE_PROVIDER_SITE_OTHER): Payer: BC Managed Care – PPO | Admitting: Psychology

## 2019-06-16 DIAGNOSIS — F418 Other specified anxiety disorders: Secondary | ICD-10-CM

## 2019-06-21 DIAGNOSIS — D485 Neoplasm of uncertain behavior of skin: Secondary | ICD-10-CM | POA: Diagnosis not present

## 2019-06-21 DIAGNOSIS — D2262 Melanocytic nevi of left upper limb, including shoulder: Secondary | ICD-10-CM | POA: Diagnosis not present

## 2019-06-21 DIAGNOSIS — D2261 Melanocytic nevi of right upper limb, including shoulder: Secondary | ICD-10-CM | POA: Diagnosis not present

## 2019-06-21 DIAGNOSIS — D2272 Melanocytic nevi of left lower limb, including hip: Secondary | ICD-10-CM | POA: Diagnosis not present

## 2019-06-21 DIAGNOSIS — D2271 Melanocytic nevi of right lower limb, including hip: Secondary | ICD-10-CM | POA: Diagnosis not present

## 2019-06-21 DIAGNOSIS — D225 Melanocytic nevi of trunk: Secondary | ICD-10-CM | POA: Diagnosis not present

## 2019-06-30 ENCOUNTER — Ambulatory Visit (INDEPENDENT_AMBULATORY_CARE_PROVIDER_SITE_OTHER): Payer: BC Managed Care – PPO | Admitting: Psychology

## 2019-06-30 DIAGNOSIS — F418 Other specified anxiety disorders: Secondary | ICD-10-CM

## 2019-07-14 ENCOUNTER — Ambulatory Visit (INDEPENDENT_AMBULATORY_CARE_PROVIDER_SITE_OTHER): Payer: BC Managed Care – PPO | Admitting: Psychology

## 2019-07-14 DIAGNOSIS — F418 Other specified anxiety disorders: Secondary | ICD-10-CM | POA: Diagnosis not present

## 2019-07-28 ENCOUNTER — Ambulatory Visit (INDEPENDENT_AMBULATORY_CARE_PROVIDER_SITE_OTHER): Payer: BC Managed Care – PPO | Admitting: Psychology

## 2019-07-28 DIAGNOSIS — F418 Other specified anxiety disorders: Secondary | ICD-10-CM | POA: Diagnosis not present

## 2019-08-11 ENCOUNTER — Ambulatory Visit (INDEPENDENT_AMBULATORY_CARE_PROVIDER_SITE_OTHER): Payer: BC Managed Care – PPO | Admitting: Psychology

## 2019-08-11 DIAGNOSIS — F418 Other specified anxiety disorders: Secondary | ICD-10-CM

## 2019-08-25 ENCOUNTER — Ambulatory Visit (INDEPENDENT_AMBULATORY_CARE_PROVIDER_SITE_OTHER): Payer: BC Managed Care – PPO | Admitting: Psychology

## 2019-08-25 DIAGNOSIS — F418 Other specified anxiety disorders: Secondary | ICD-10-CM | POA: Diagnosis not present

## 2019-09-08 ENCOUNTER — Ambulatory Visit (INDEPENDENT_AMBULATORY_CARE_PROVIDER_SITE_OTHER): Payer: BC Managed Care – PPO | Admitting: Psychology

## 2019-09-08 DIAGNOSIS — F418 Other specified anxiety disorders: Secondary | ICD-10-CM | POA: Diagnosis not present

## 2019-09-22 ENCOUNTER — Ambulatory Visit: Payer: BC Managed Care – PPO | Admitting: Psychology

## 2019-10-06 ENCOUNTER — Ambulatory Visit (INDEPENDENT_AMBULATORY_CARE_PROVIDER_SITE_OTHER): Payer: BC Managed Care – PPO | Admitting: Psychology

## 2019-10-06 DIAGNOSIS — F418 Other specified anxiety disorders: Secondary | ICD-10-CM | POA: Diagnosis not present

## 2019-10-07 ENCOUNTER — Encounter: Payer: Self-pay | Admitting: Family Medicine

## 2019-10-20 ENCOUNTER — Ambulatory Visit (INDEPENDENT_AMBULATORY_CARE_PROVIDER_SITE_OTHER): Payer: BC Managed Care – PPO | Admitting: Psychology

## 2019-10-20 DIAGNOSIS — F418 Other specified anxiety disorders: Secondary | ICD-10-CM

## 2019-11-03 ENCOUNTER — Ambulatory Visit (INDEPENDENT_AMBULATORY_CARE_PROVIDER_SITE_OTHER): Payer: BC Managed Care – PPO | Admitting: Psychology

## 2019-11-03 DIAGNOSIS — F418 Other specified anxiety disorders: Secondary | ICD-10-CM

## 2019-12-01 ENCOUNTER — Ambulatory Visit (INDEPENDENT_AMBULATORY_CARE_PROVIDER_SITE_OTHER): Payer: BC Managed Care – PPO | Admitting: Psychology

## 2019-12-01 DIAGNOSIS — F418 Other specified anxiety disorders: Secondary | ICD-10-CM | POA: Diagnosis not present

## 2019-12-15 ENCOUNTER — Ambulatory Visit: Payer: BC Managed Care – PPO | Admitting: Psychology

## 2019-12-29 ENCOUNTER — Ambulatory Visit (INDEPENDENT_AMBULATORY_CARE_PROVIDER_SITE_OTHER): Payer: BC Managed Care – PPO | Admitting: Psychology

## 2019-12-29 DIAGNOSIS — F418 Other specified anxiety disorders: Secondary | ICD-10-CM

## 2020-01-12 ENCOUNTER — Ambulatory Visit (INDEPENDENT_AMBULATORY_CARE_PROVIDER_SITE_OTHER): Payer: BC Managed Care – PPO | Admitting: Psychology

## 2020-01-12 DIAGNOSIS — F418 Other specified anxiety disorders: Secondary | ICD-10-CM | POA: Diagnosis not present

## 2020-01-26 ENCOUNTER — Ambulatory Visit (INDEPENDENT_AMBULATORY_CARE_PROVIDER_SITE_OTHER): Payer: BC Managed Care – PPO | Admitting: Psychology

## 2020-01-26 DIAGNOSIS — F418 Other specified anxiety disorders: Secondary | ICD-10-CM | POA: Diagnosis not present

## 2020-02-01 ENCOUNTER — Other Ambulatory Visit: Payer: Self-pay | Admitting: Family Medicine

## 2020-02-01 DIAGNOSIS — F419 Anxiety disorder, unspecified: Secondary | ICD-10-CM

## 2020-02-07 IMAGING — DX DG ABDOMEN 1V
1 series · 1 of 1 positions shown · non-contrast
Comparison: 03/30/2017

CLINICAL DATA: Left lower quadrant pain and hematuria

EXAM:
ABDOMEN - 1 VIEW

[abdomen supine ap]
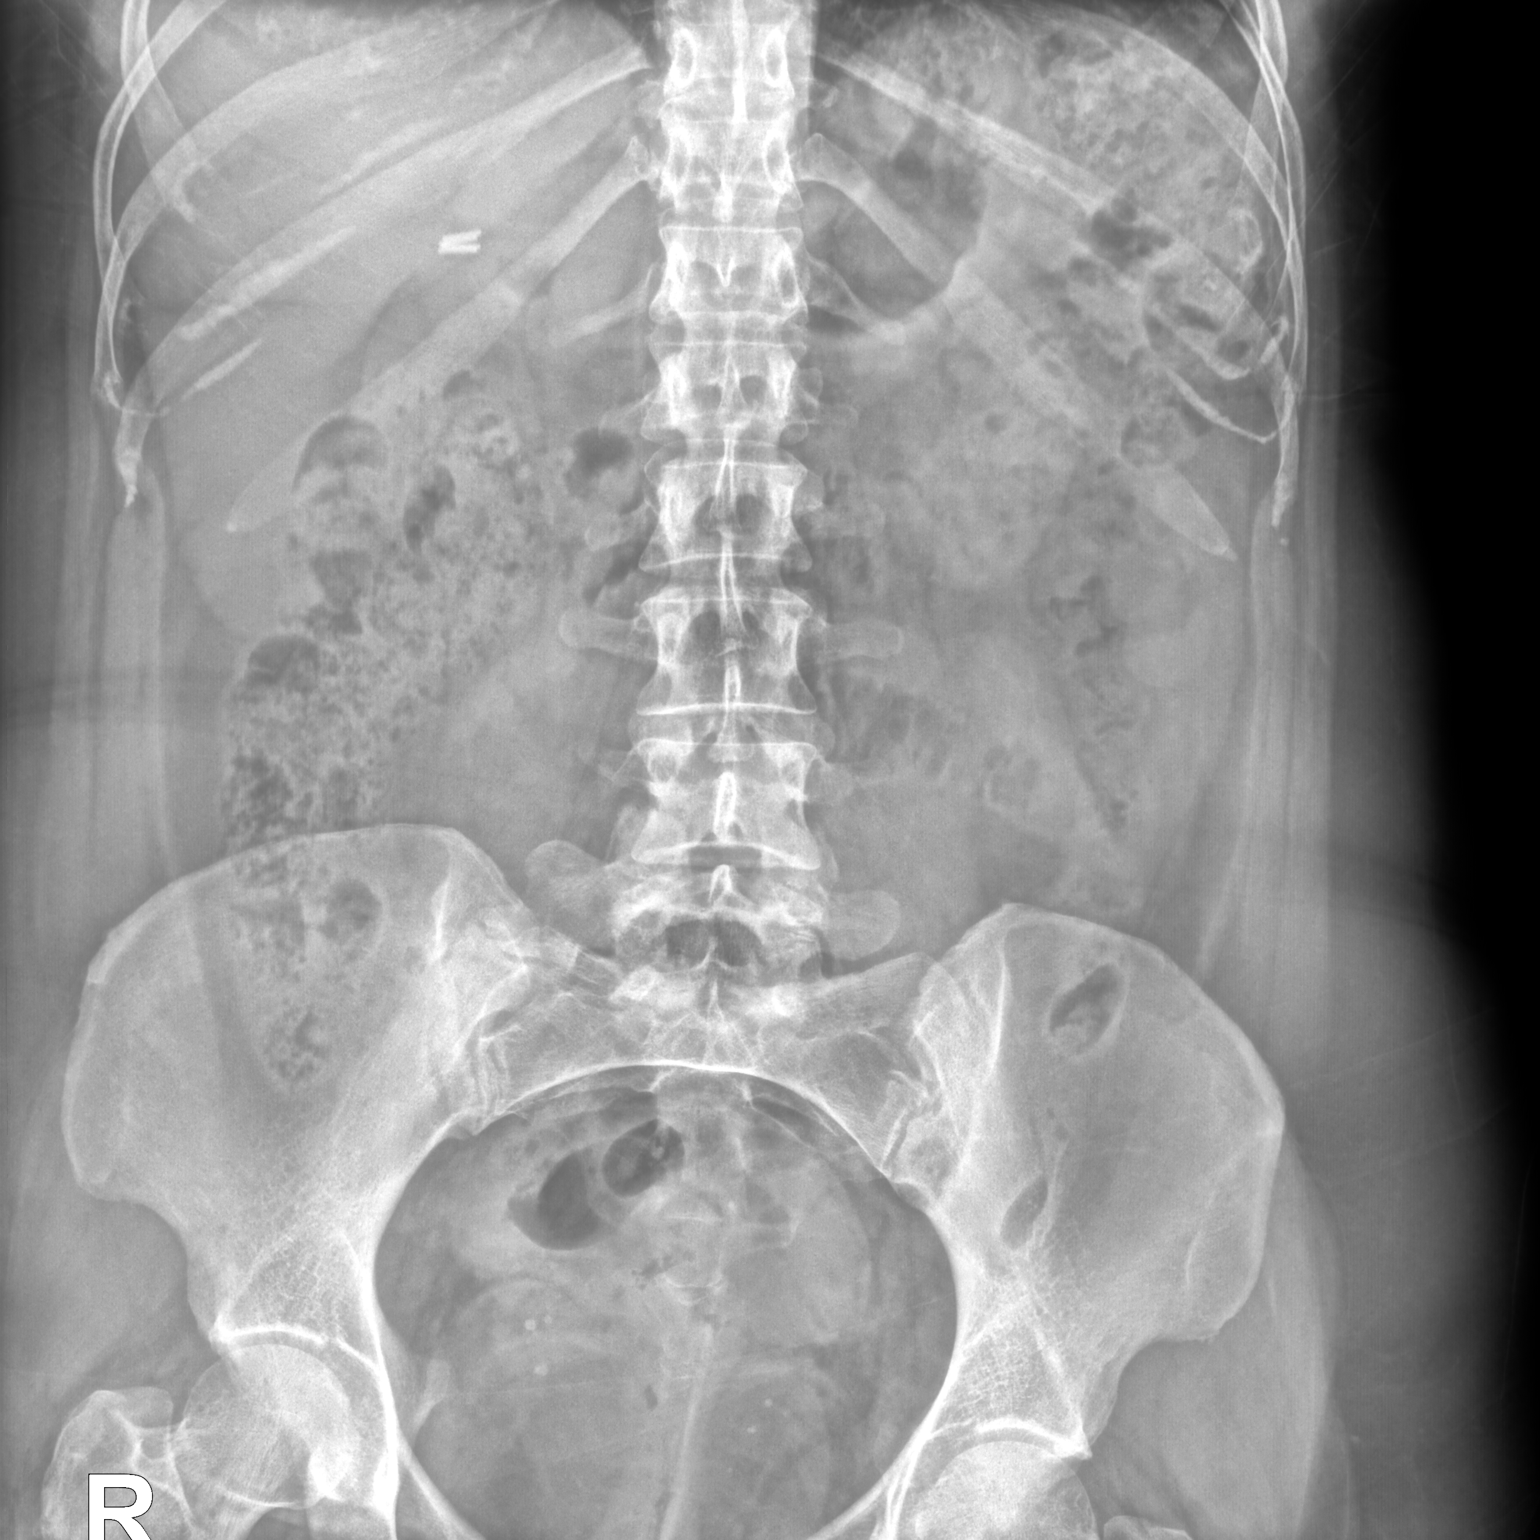

[1 of 1 positions shown; findings below may reference images not displayed]

FINDINGS: Scattered large and small bowel gas is noted. No definitive renal or
ureteral calculi are seen. Multiple phleboliths are seen within the
pelvis. No acute bony abnormality is noted.
IMPRESSION: No evidence of renal or ureteral calculi.

## 2020-02-09 ENCOUNTER — Ambulatory Visit (INDEPENDENT_AMBULATORY_CARE_PROVIDER_SITE_OTHER): Payer: BC Managed Care – PPO | Admitting: Psychology

## 2020-02-09 DIAGNOSIS — F418 Other specified anxiety disorders: Secondary | ICD-10-CM | POA: Diagnosis not present

## 2020-02-23 ENCOUNTER — Ambulatory Visit (INDEPENDENT_AMBULATORY_CARE_PROVIDER_SITE_OTHER): Payer: BC Managed Care – PPO | Admitting: Psychology

## 2020-02-23 DIAGNOSIS — F418 Other specified anxiety disorders: Secondary | ICD-10-CM

## 2020-02-24 DIAGNOSIS — Z1231 Encounter for screening mammogram for malignant neoplasm of breast: Secondary | ICD-10-CM | POA: Diagnosis not present

## 2020-02-24 LAB — HM MAMMOGRAPHY

## 2020-02-29 ENCOUNTER — Encounter: Payer: Self-pay | Admitting: Family Medicine

## 2020-03-08 ENCOUNTER — Ambulatory Visit (INDEPENDENT_AMBULATORY_CARE_PROVIDER_SITE_OTHER): Payer: BC Managed Care – PPO | Admitting: Psychology

## 2020-03-08 DIAGNOSIS — F418 Other specified anxiety disorders: Secondary | ICD-10-CM | POA: Diagnosis not present

## 2020-03-22 ENCOUNTER — Ambulatory Visit (INDEPENDENT_AMBULATORY_CARE_PROVIDER_SITE_OTHER): Payer: BC Managed Care – PPO | Admitting: Psychology

## 2020-03-22 DIAGNOSIS — F418 Other specified anxiety disorders: Secondary | ICD-10-CM

## 2020-04-05 ENCOUNTER — Ambulatory Visit (INDEPENDENT_AMBULATORY_CARE_PROVIDER_SITE_OTHER): Payer: BC Managed Care – PPO | Admitting: Psychology

## 2020-04-05 DIAGNOSIS — F418 Other specified anxiety disorders: Secondary | ICD-10-CM

## 2020-04-19 ENCOUNTER — Ambulatory Visit (INDEPENDENT_AMBULATORY_CARE_PROVIDER_SITE_OTHER): Payer: BC Managed Care – PPO | Admitting: Psychology

## 2020-04-19 DIAGNOSIS — F418 Other specified anxiety disorders: Secondary | ICD-10-CM

## 2020-04-22 ENCOUNTER — Other Ambulatory Visit: Payer: Self-pay | Admitting: Family Medicine

## 2020-04-22 DIAGNOSIS — F419 Anxiety disorder, unspecified: Secondary | ICD-10-CM

## 2020-05-03 ENCOUNTER — Ambulatory Visit (INDEPENDENT_AMBULATORY_CARE_PROVIDER_SITE_OTHER): Payer: BC Managed Care – PPO | Admitting: Psychology

## 2020-05-03 DIAGNOSIS — F418 Other specified anxiety disorders: Secondary | ICD-10-CM | POA: Diagnosis not present

## 2020-05-16 DIAGNOSIS — D225 Melanocytic nevi of trunk: Secondary | ICD-10-CM | POA: Diagnosis not present

## 2020-05-16 DIAGNOSIS — D2262 Melanocytic nevi of left upper limb, including shoulder: Secondary | ICD-10-CM | POA: Diagnosis not present

## 2020-05-16 DIAGNOSIS — L918 Other hypertrophic disorders of the skin: Secondary | ICD-10-CM | POA: Diagnosis not present

## 2020-05-16 DIAGNOSIS — D2261 Melanocytic nevi of right upper limb, including shoulder: Secondary | ICD-10-CM | POA: Diagnosis not present

## 2020-05-17 ENCOUNTER — Ambulatory Visit (INDEPENDENT_AMBULATORY_CARE_PROVIDER_SITE_OTHER): Payer: BC Managed Care – PPO | Admitting: Psychology

## 2020-05-17 DIAGNOSIS — F418 Other specified anxiety disorders: Secondary | ICD-10-CM

## 2020-05-31 ENCOUNTER — Ambulatory Visit (INDEPENDENT_AMBULATORY_CARE_PROVIDER_SITE_OTHER): Payer: BC Managed Care – PPO | Admitting: Psychology

## 2020-05-31 DIAGNOSIS — F418 Other specified anxiety disorders: Secondary | ICD-10-CM

## 2020-06-14 ENCOUNTER — Ambulatory Visit (INDEPENDENT_AMBULATORY_CARE_PROVIDER_SITE_OTHER): Payer: BC Managed Care – PPO | Admitting: Psychology

## 2020-06-14 DIAGNOSIS — F418 Other specified anxiety disorders: Secondary | ICD-10-CM | POA: Diagnosis not present

## 2020-06-28 ENCOUNTER — Ambulatory Visit (INDEPENDENT_AMBULATORY_CARE_PROVIDER_SITE_OTHER): Payer: BC Managed Care – PPO | Admitting: Psychology

## 2020-06-28 DIAGNOSIS — F418 Other specified anxiety disorders: Secondary | ICD-10-CM | POA: Diagnosis not present

## 2020-07-12 ENCOUNTER — Ambulatory Visit (INDEPENDENT_AMBULATORY_CARE_PROVIDER_SITE_OTHER): Payer: BC Managed Care – PPO | Admitting: Psychology

## 2020-07-12 DIAGNOSIS — F418 Other specified anxiety disorders: Secondary | ICD-10-CM | POA: Diagnosis not present

## 2020-07-22 ENCOUNTER — Other Ambulatory Visit: Payer: Self-pay | Admitting: Family Medicine

## 2020-07-22 DIAGNOSIS — F419 Anxiety disorder, unspecified: Secondary | ICD-10-CM

## 2020-07-24 NOTE — Telephone Encounter (Signed)
Refill request for:  Citalopram 10 mg  LR 04/24/20, #30, 2 rf LOV 06/09/19 FOV   None scheduled.  Please review and advise.  Thanks.   Dm/cma

## 2020-07-26 ENCOUNTER — Ambulatory Visit (INDEPENDENT_AMBULATORY_CARE_PROVIDER_SITE_OTHER): Payer: BC Managed Care – PPO | Admitting: Psychology

## 2020-07-26 DIAGNOSIS — F418 Other specified anxiety disorders: Secondary | ICD-10-CM

## 2020-08-09 ENCOUNTER — Ambulatory Visit (INDEPENDENT_AMBULATORY_CARE_PROVIDER_SITE_OTHER): Payer: BC Managed Care – PPO | Admitting: Psychology

## 2020-08-09 DIAGNOSIS — F418 Other specified anxiety disorders: Secondary | ICD-10-CM | POA: Diagnosis not present

## 2020-08-16 ENCOUNTER — Other Ambulatory Visit: Payer: Self-pay | Admitting: Family Medicine

## 2020-08-16 DIAGNOSIS — F419 Anxiety disorder, unspecified: Secondary | ICD-10-CM

## 2020-08-23 ENCOUNTER — Ambulatory Visit (INDEPENDENT_AMBULATORY_CARE_PROVIDER_SITE_OTHER): Payer: BC Managed Care – PPO | Admitting: Psychology

## 2020-08-23 DIAGNOSIS — F418 Other specified anxiety disorders: Secondary | ICD-10-CM | POA: Diagnosis not present

## 2020-09-06 ENCOUNTER — Ambulatory Visit (INDEPENDENT_AMBULATORY_CARE_PROVIDER_SITE_OTHER): Payer: BC Managed Care – PPO | Admitting: Psychology

## 2020-09-06 DIAGNOSIS — F418 Other specified anxiety disorders: Secondary | ICD-10-CM

## 2020-09-13 ENCOUNTER — Other Ambulatory Visit: Payer: Self-pay | Admitting: Family Medicine

## 2020-09-13 DIAGNOSIS — F419 Anxiety disorder, unspecified: Secondary | ICD-10-CM

## 2020-09-20 ENCOUNTER — Ambulatory Visit (INDEPENDENT_AMBULATORY_CARE_PROVIDER_SITE_OTHER): Payer: BC Managed Care – PPO | Admitting: Psychology

## 2020-09-20 DIAGNOSIS — F418 Other specified anxiety disorders: Secondary | ICD-10-CM

## 2020-10-04 ENCOUNTER — Ambulatory Visit (INDEPENDENT_AMBULATORY_CARE_PROVIDER_SITE_OTHER): Payer: BC Managed Care – PPO | Admitting: Psychology

## 2020-10-04 DIAGNOSIS — F418 Other specified anxiety disorders: Secondary | ICD-10-CM

## 2020-10-10 ENCOUNTER — Other Ambulatory Visit: Payer: Self-pay

## 2020-10-10 ENCOUNTER — Encounter: Payer: Self-pay | Admitting: Family Medicine

## 2020-10-10 ENCOUNTER — Ambulatory Visit (INDEPENDENT_AMBULATORY_CARE_PROVIDER_SITE_OTHER): Payer: BC Managed Care – PPO | Admitting: Family Medicine

## 2020-10-10 VITALS — BP 120/70 | HR 93 | Resp 16 | Ht 63.0 in | Wt 198.5 lb

## 2020-10-10 DIAGNOSIS — Z1159 Encounter for screening for other viral diseases: Secondary | ICD-10-CM

## 2020-10-10 DIAGNOSIS — F419 Anxiety disorder, unspecified: Secondary | ICD-10-CM

## 2020-10-10 DIAGNOSIS — Z0000 Encounter for general adult medical examination without abnormal findings: Secondary | ICD-10-CM

## 2020-10-10 DIAGNOSIS — Z1329 Encounter for screening for other suspected endocrine disorder: Secondary | ICD-10-CM | POA: Diagnosis not present

## 2020-10-10 DIAGNOSIS — E559 Vitamin D deficiency, unspecified: Secondary | ICD-10-CM | POA: Diagnosis not present

## 2020-10-10 DIAGNOSIS — E782 Mixed hyperlipidemia: Secondary | ICD-10-CM | POA: Diagnosis not present

## 2020-10-10 DIAGNOSIS — Z13228 Encounter for screening for other metabolic disorders: Secondary | ICD-10-CM | POA: Diagnosis not present

## 2020-10-10 DIAGNOSIS — Z130 Encounter for screening for diseases of the blood and blood-forming organs and certain disorders involving the immune mechanism: Secondary | ICD-10-CM

## 2020-10-10 LAB — HEMOGLOBIN A1C: Hgb A1c MFr Bld: 6.1 % (ref 4.6–6.5)

## 2020-10-10 LAB — LIPID PANEL
Cholesterol: 257 mg/dL — ABNORMAL HIGH (ref 0–200)
HDL: 41 mg/dL (ref >39.00)
NonHDL: 215.54
Total CHOL/HDL Ratio: 6
Triglycerides: 259 mg/dL — ABNORMAL HIGH (ref 0.0–149.0)
VLDL: 51.8 mg/dL — ABNORMAL HIGH (ref 0.0–40.0)

## 2020-10-10 LAB — BASIC METABOLIC PANEL
BUN: 11 mg/dL (ref 6–23)
CO2: 26 mEq/L (ref 19–32)
Calcium: 9.8 mg/dL (ref 8.4–10.5)
Chloride: 101 mEq/L (ref 96–112)
Creatinine, Ser: 0.83 mg/dL (ref 0.40–1.20)
GFR: 86.05 mL/min (ref >60.00)
Glucose, Bld: 88 mg/dL (ref 70–99)
Potassium: 4.7 mEq/L (ref 3.5–5.1)
Sodium: 137 mEq/L (ref 135–145)

## 2020-10-10 LAB — LDL CHOLESTEROL, DIRECT: Direct LDL: 174 mg/dL

## 2020-10-10 LAB — VITAMIN D 25 HYDROXY (VIT D DEFICIENCY, FRACTURES): VITD: 38.07 ng/mL (ref 30.00–100.00)

## 2020-10-10 MED ORDER — CITALOPRAM HYDROBROMIDE 10 MG PO TABS: 10.0000 mg | ORAL_TABLET | Freq: Every day | ORAL | 3 refills | Status: DC

## 2020-10-10 NOTE — Patient Instructions (Addendum)
Today you have you routine preventive visit. A few things to remember from today's visit:   Routine general medical examination at a health care facility  Hyperlipemia, mixed - Plan: Lipid panel  Anxiety disorder, unspecified type - Plan: citalopram (CELEXA) 10 MG tablet  Encounter for HCV screening test for low risk patient - Plan: Hepatitis C antibody screen  Screening for endocrine, metabolic and immunity disorder - Plan: Basic metabolic panel, Hemoglobin A1c  If you need refills please call your pharmacy. Do not use My Chart to request refills or for acute issues that need immediate attention.   Please be sure medication list is accurate. If a new problem present, please set up appointment sooner than planned today.  At least 150 minutes of moderate exercise per week, daily brisk walking for 15-30 min is a good exercise option. Healthy diet low in saturated (animal) fats and sweets and consisting of fresh fruits and vegetables, lean meats such as fish and white chicken and whole grains.  These are some of recommendations for screening depending of age and risk factors:  - Vaccines:  Tdap vaccine every 10 years.  Shingles vaccine recommended at age 64, could be given after 44 years of age but not sure about insurance coverage.   Pneumonia vaccines: Pneumovax at 34. Sometimes Pneumovax is giving earlier if history of smoking, lung disease,diabetes,kidney disease among some.  Screening for diabetes at age 39 and every 3 years.  Cervical cancer prevention:  Pap smear starts at 44 years of age and continues periodically until 44 years old in low risk women. Pap smear every 3 years between 14 and 20 years old. Pap smear every 3-5 years between women 12 and older if pap smear negative and HPV screening negative.   -Breast cancer: Mammogram: There is disagreement between experts about when to start screening in low risk asymptomatic female but recent recommendations are to start  screening at 10 and not later than 44 years old , every 1-2 years and after 44 yo q 2 years. Screening is recommended until 44 years old but some women can continue screening depending of healthy issues.  Colon cancer screening: Has been recently changed to 44 yo. Insurance may not cover until you are 44 years old. Screening is recommended until 44 years old.N/A  Cholesterol disorder screening at age 69 and every 3 years. N/A  Also recommended:  1. Dental visit- Brush and floss your teeth twice daily; visit your dentist twice a year. 2. Eye doctor- Get an eye exam at least every 2 years. 3. Helmet use- Always wear a helmet when riding a bicycle, motorcycle, rollerblading or skateboarding. 4. Safe sex- If you may be exposed to sexually transmitted infections, use a condom. 5. Seat belts- Seat belts can save your live; always wear one. 6. Smoke/Carbon Monoxide detectors- These detectors need to be installed on the appropriate level of your home. Replace batteries at least once a year. 7. Skin cancer- When out in the sun please cover up and use sunscreen 15 SPF or higher. 8. Violence- If anyone is threatening or hurting you, please tell your healthcare provider.  9. Drink alcohol in moderation- Limit alcohol intake to one drink or less per day. Never drink and drive. 10. Calcium supplementation 1000 to 1200 mg daily, ideally through your diet.  Vitamin D supplementation 800 units daily.

## 2020-10-10 NOTE — Assessment & Plan Note (Signed)
She has not tolerated stain medication in the past. Continue non pharmacologic treatment. Further recommendations according to 10 years CVD risk and FLP numbers.

## 2020-10-10 NOTE — Assessment & Plan Note (Signed)
Problem seems to be better controlled. She is not interested in increasing dose of Celexa, so continue 10 mg daily. Continue CBT. As far as she continue seeing her therapist regularly, I think it is appropriate to follow annually unless symptoms get worse.

## 2020-10-10 NOTE — Progress Notes (Signed)
HPI: [name] is a 45 y.o. female, who is here today for her routine physical.  Last CPE: 01/15/19.  Regular exercise 3 or more time per week: Yard work 5 times per week, cleaning 2 achers for the past 1-2 months. Following a healthy diet: Cooking most of the time, tries to follows a healthier diet. She lives with her husband and 3 children.  Chronic medical problems: HLD,nephrolithiasis, vit D def,and anxiety.  Pap smear:Current. She is following with gyn. Hx of abnormal pap smears: Negative. Hx of STD's: Negative.  Immunization History  Administered Date(s) Administered  . Hepatitis A 08/22/2009, 01/08/2010  . Influenza Inj Mdck Quad Pf 03/27/2018  . Influenza,inj,Quad PF,6+ Mos 05/07/2016, 05/07/2017, 04/06/2019  . Influenza-Unspecified 03/31/2018  . Tdap 03/31/2006, 11/04/2016   Mammogram: 02/24/20. Colonoscopy: N/A DEXA: N/A  Hep C screening: Never  She has no new concerns today. She has not tolerated statin meds, caused skin pruritus.   Lab Results  Component Value Date   CHOL 257 (H) 01/15/2019   HDL 38.50 (L) 01/15/2019   LDLCALC 162 (H) 05/07/2016   LDLDIRECT 172.0 01/15/2019   TRIG 252.0 (H) 01/15/2019   CHOLHDL 7 01/15/2019   Anxiety: She is on Celexa 10 mg daily. Her husband works from home a couple of times per week and all her children are back to school. She follows with psychotherapist q 2 weeks. She feels like medication has helped. She is dealing better with stress.  Vit D deficiency: She takes daily multivitamins.  Review of Systems  Constitutional: Negative for appetite change and fever.  HENT: Negative for dental problem, hearing loss, mouth sores and sore throat.   Eyes: Negative for redness and visual disturbance.  Respiratory: Negative for cough, shortness of breath and wheezing.   Cardiovascular: Negative for chest pain and leg swelling.  Gastrointestinal: Negative for abdominal pain, nausea and vomiting.       No changes in  bowel habits.  Endocrine: Negative for cold intolerance, heat intolerance, polydipsia, polyphagia and polyuria.  Genitourinary: Negative for decreased urine volume, dysuria, hematuria, vaginal bleeding and vaginal discharge.  Musculoskeletal: Negative for gait problem and myalgias.  Skin: Negative for color change and rash.  Allergic/Immunologic: Positive for environmental allergies.  Neurological: Negative for syncope, weakness and headaches.  Hematological: Negative for adenopathy. Does not bruise/bleed easily.  Psychiatric/Behavioral: Negative for confusion. The patient is nervous/anxious.   All other systems reviewed and are negative.   Current Outpatient Medications on File Prior to Visit  Medication Sig Dispense Refill  . albuterol (PROVENTIL HFA) 108 (90 Base) MCG/ACT inhaler Inhale 2 puffs into the lungs every 6 (six) hours as needed for wheezing or shortness of breath. 1 Inhaler 3  . cetirizine (ZYRTEC) 10 MG tablet Take 10 mg by mouth daily.    . Cholecalciferol (VITAMIN D3 PO) Take 2,000 mg by mouth daily.     . LUTEIN PO Take 1 capsule by mouth daily.    . Multiple Vitamins-Minerals (MULTIVITAMIN WITH MINERALS) tablet Take 1 tablet by mouth daily.    . Omega-3 Fatty Acids (FISH OIL) 1000 MG CAPS Take by mouth.    Marland Kitchen omeprazole (PRILOSEC) 20 MG capsule Take 1 capsule (20 mg total) by mouth daily before breakfast. 30 capsule 1  . Probiotic Product (PROBIOTIC PO) Take 1 tablet by mouth every other day.    . PSYLLIUM HUSK PO Take 2 capsules by mouth daily.    Marland Kitchen SPIRULINA PO Take 1 tablet by mouth daily. For food allergy  prevention    . Triamcinolone Acetonide (TRIAMCINOLONE 0.1 % CREAM : EUCERIN) CREA Apply 1 application topically 2 (two) times daily as needed for rash or itching. 500 each 1  . Turmeric Curcumin 500 MG CAPS Take by mouth.     No current facility-administered medications on file prior to visit.     Past Medical History:  Diagnosis Date  . ALLERGIC RHINITIS  05/31/2008  . Anemia   . ANXIETY 05/31/2008  . ASTHMA 05/31/2008  . Asthma   . CHOLELITHIASIS 05/31/2008  . High blood pressure    readings  . History of kidney stones   . History of right breast biopsy 2017  . HYPERLIPIDEMIA 05/31/2008  . Kidney stone   . NEPHROLITHIASIS, HX OF 05/31/2008    Past Surgical History:  Procedure Laterality Date  . CESAREAN SECTION  [phone]  . CHOLECYSTECTOMY  1999  . KIDNEY STONE SURGERY    . ORIF CONGENITAL HIP DISLOCATION      Allergies  Allergen Reactions  . Beef-Derived Products Diarrhea    Nausea and vomiting  . Prochlorperazine Edisylate Shortness Of Breath    Compazine Tongue swelling  . Prochlorperazine Edisylate Swelling    Swelling of throat  . Tree Extract Anaphylaxis    Throat itchy, roof of mouth gets raw  . Banana Itching and Nausea And Vomiting    Throat gets itchy  . Beef (Bovine) Protein     Diarrhea and stomach cramps   . Oxycodone-Acetaminophen     Vomiting  . Permethrin Rash    Rash and fever  . Statins Itching  . Morphine And Related Itching  . Other     Tree Nuts (can have peanuts and cashews) Pyrethrine (ingredient in lice products)- fever and rash  . Oxycodone-Acetaminophen Nausea And Vomiting  . Latex Hives and Rash    Rash    Family History  Problem Relation Age of Onset  . Breast cancer Mother 62  . Arthritis Mother   . Hyperlipidemia Father        triglycerides  . Mental illness Brother        bipolar  . Asthma Brother   . Allergic Disorder Daughter   . Cancer Maternal Uncle        mult myeloma  . Vascular Disease Maternal Uncle   . Heart disease Paternal Grandfather     Social History   Socioeconomic History  . Marital status: Married    Spouse name: Not on file  . Number of children: 3  . Years of education: BS  . Highest education level: Not on file  Occupational History  . Occupation: Soil scientist  . Occupation: Substitute  Tobacco Use  . Smoking status: Never Smoker  .  Smokeless tobacco: Never Used  Substance and Sexual Activity  . Alcohol use: No  . Drug use: No  . Sexual activity: Yes    Birth control/protection: Surgical  Other Topics Concern  . Not on file  Social History Narrative   Lives with husband & 3 kids, work at home, PT outside of home. Often volunteers.   Social Determinants of Health   Financial Resource Strain: Not on file  Food Insecurity: Not on file  Transportation Needs: Not on file  Physical Activity: Not on file  Stress: Not on file  Social Connections: Not on file    Vitals:   10/10/20 0848  BP: 120/70  Pulse: 93  Resp: 16  SpO2: 99%   Body mass index is 35.16 kg/m.  Wt  Readings from Last 3 Encounters:  10/10/20 198 lb 8 oz (90 kg)  06/09/19 193 lb 4 oz (87.7 kg)  01/15/19 189 lb 4 oz (85.8 kg)   Physical Exam Vitals and nursing note reviewed.  Constitutional:      General: She is not in acute distress.    Appearance: She is well-developed.  HENT:     Head: Normocephalic and atraumatic.     Right Ear: Hearing, tympanic membrane, ear canal and external ear normal.     Left Ear: Hearing, tympanic membrane, ear canal and external ear normal.     Mouth/Throat:     Mouth: Mucous membranes are moist.     Pharynx: Oropharynx is clear. Uvula midline.  Eyes:     Extraocular Movements: Extraocular movements intact.     Conjunctiva/sclera: Conjunctivae normal.     Pupils: Pupils are equal, round, and reactive to light.  Neck:     Thyroid: No thyromegaly.     Trachea: No tracheal deviation.  Cardiovascular:     Rate and Rhythm: Normal rate and regular rhythm.     Pulses:          Dorsalis pedis pulses are 2+ on the right side and 2+ on the left side.     Heart sounds: No murmur heard.   Pulmonary:     Effort: Pulmonary effort is normal. No respiratory distress.     Breath sounds: Normal breath sounds.  Chest:  Breasts:     Right: No supraclavicular adenopathy.     Left: No supraclavicular adenopathy.     Abdominal:     Palpations: Abdomen is soft. There is no hepatomegaly or mass.     Tenderness: There is no abdominal tenderness.  Genitourinary:    Comments: Deferred to gyn. Musculoskeletal:     Comments: No major deformity or signs of synovitis appreciated.  Lymphadenopathy:     Cervical: No cervical adenopathy.     Upper Body:     Right upper body: No supraclavicular adenopathy.     Left upper body: No supraclavicular adenopathy.  Skin:    General: Skin is warm.     Findings: No erythema or rash.  Neurological:     General: No focal deficit present.     Mental Status: She is alert and oriented to person, place, and time.     Cranial Nerves: No cranial nerve deficit.     Coordination: Coordination normal.     Gait: Gait normal.     Deep Tendon Reflexes:     Reflex Scores:      Bicep reflexes are 2+ on the right side and 2+ on the left side.      Patellar reflexes are 2+ on the right side and 2+ on the left side.  ASSESSMENT AND PLAN:  Ms. Angela Harrell was here today annual physical examination.  Orders Placed This Encounter  Procedures  . Basic metabolic panel  . Hepatitis C antibody screen  . Lipid panel  . Hemoglobin A1c  . VITAMIN D 25 Hydroxy (Vit-D Deficiency, Fractures)  . LDL cholesterol, direct    Lab Results  Component Value Date   CHOL 257 (H) 10/10/2020   HDL 41.00 10/10/2020   LDLCALC 162 (H) 05/07/2016   LDLDIRECT 174.0 10/10/2020   TRIG 259.0 (H) 10/10/2020   CHOLHDL 6 10/10/2020   Lab Results  Component Value Date   HGBA1C 6.1 10/10/2020   Lab Results  Component Value Date   CREATININE 0.83 10/10/2020   BUN  11 10/10/2020   NA 137 10/10/2020   K 4.7 10/10/2020   CL 101 10/10/2020   CO2 26 10/10/2020   Routine general medical examination at a health care facility We discussed the importance of regular physical activity and healthy diet for prevention of chronic illness and/or complications. Preventive guidelines  reviewed. Vaccination up to date.  Next CPE in a year.  Encounter for HCV screening test for low risk patient -     Hepatitis C antibody screen  Screening for endocrine, metabolic and immunity disorder -     Hemoglobin A1c -     Basic metabolic panel  Vitamin D deficiency Further recommendations according to 25 OH vit D result.  Hyperlipemia, mixed She has not tolerated stain medication in the past. Continue non pharmacologic treatment. Further recommendations according to 10 years CVD risk and FLP numbers.  Anxiety disorder, unspecified Problem seems to be better controlled. She is not interested in increasing dose of Celexa, so continue 10 mg daily. Continue CBT. As far as she continue seeing her therapist regularly, I think it is appropriate to follow annually unless symptoms get worse.   Return in 1 year (on 10/10/2021) for cpe and f/u.   Angela Hasley G. Martinique, MD  [facility]. [place] office.  Today you have you routine preventive visit. A few things to remember from today's visit:   Routine general medical examination at a health care facility  Hyperlipemia, mixed - Plan: Lipid panel  Anxiety disorder, unspecified type - Plan: citalopram (CELEXA) 10 MG tablet  Encounter for HCV screening test for low risk patient - Plan: Hepatitis C antibody screen  Screening for endocrine, metabolic and immunity disorder - Plan: Basic metabolic panel, Hemoglobin A1c  If you need refills please call your pharmacy. Do not use My Chart to request refills or for acute issues that need immediate attention.   Please be sure medication list is accurate. If a new problem present, please set up appointment sooner than planned today.  At least 150 minutes of moderate exercise per week, daily brisk walking for 15-30 min is a good exercise option. Healthy diet low in saturated (animal) fats and sweets and consisting of fresh fruits and vegetables, lean meats such as fish and  white chicken and whole grains.  These are some of recommendations for screening depending of age and risk factors:  - Vaccines:  Tdap vaccine every 10 years.  Shingles vaccine recommended at age 84, could be given after 44 years of age but not sure about insurance coverage.   Pneumonia vaccines: Pneumovax at 62. Sometimes Pneumovax is giving earlier if history of smoking, lung disease,diabetes,kidney disease among some.  Screening for diabetes at age 51 and every 3 years.  Cervical cancer prevention:  Pap smear starts at 44 years of age and continues periodically until 44 years old in low risk women. Pap smear every 3 years between 17 and 67 years old. Pap smear every 3-5 years between women 87 and older if pap smear negative and HPV screening negative.   -Breast cancer: Mammogram: There is disagreement between experts about when to start screening in low risk asymptomatic female but recent recommendations are to start screening at 47 and not later than 44 years old , every 1-2 years and after 44 yo q 2 years. Screening is recommended until 44 years old but some women can continue screening depending of healthy issues.  Colon cancer screening: Has been recently changed to 44 yo. Insurance may not cover until  you are 44 years old. Screening is recommended until 44 years old.N/A  Cholesterol disorder screening at age 39 and every 3 years. N/A  Also recommended:  1. Dental visit- Brush and floss your teeth twice daily; visit your dentist twice a year. 2. Eye doctor- Get an eye exam at least every 2 years. 3. Helmet use- Always wear a helmet when riding a bicycle, motorcycle, rollerblading or skateboarding. 4. Safe sex- If you may be exposed to sexually transmitted infections, use a condom. 5. Seat belts- Seat belts can save your live; always wear one. 6. Smoke/Carbon Monoxide detectors- These detectors need to be installed on the appropriate level of your home. Replace batteries at  least once a year. 7. Skin cancer- When out in the sun please cover up and use sunscreen 15 SPF or higher. 8. Violence- If anyone is threatening or hurting you, please tell your healthcare provider.  9. Drink alcohol in moderation- Limit alcohol intake to one drink or less per day. Never drink and drive. 10. Calcium supplementation 1000 to 1200 mg daily, ideally through your diet.  Vitamin D supplementation 800 units daily.

## 2020-10-10 NOTE — Assessment & Plan Note (Signed)
Further recommendations according to 25 OH vit D result.

## 2020-10-11 LAB — HEPATITIS C ANTIBODY
Hepatitis C Ab: NONREACTIVE
SIGNAL TO CUT-OFF: 0.05 (ref <1.00)

## 2020-10-18 ENCOUNTER — Ambulatory Visit (INDEPENDENT_AMBULATORY_CARE_PROVIDER_SITE_OTHER): Payer: BC Managed Care – PPO | Admitting: Psychology

## 2020-10-18 DIAGNOSIS — F418 Other specified anxiety disorders: Secondary | ICD-10-CM

## 2020-10-23 ENCOUNTER — Other Ambulatory Visit: Payer: Self-pay

## 2020-10-23 MED ORDER — ROSUVASTATIN CALCIUM 10 MG PO TABS
10.0000 mg | ORAL_TABLET | Freq: Every day | ORAL | 6 refills | Status: DC
Start: 2020-10-23 — End: 2021-05-25

## 2020-11-01 ENCOUNTER — Ambulatory Visit (INDEPENDENT_AMBULATORY_CARE_PROVIDER_SITE_OTHER): Payer: BC Managed Care – PPO | Admitting: Psychology

## 2020-11-01 DIAGNOSIS — F418 Other specified anxiety disorders: Secondary | ICD-10-CM | POA: Diagnosis not present

## 2020-11-15 ENCOUNTER — Ambulatory Visit (INDEPENDENT_AMBULATORY_CARE_PROVIDER_SITE_OTHER): Payer: BC Managed Care – PPO | Admitting: Psychology

## 2020-11-15 DIAGNOSIS — F418 Other specified anxiety disorders: Secondary | ICD-10-CM

## 2020-11-29 ENCOUNTER — Ambulatory Visit (INDEPENDENT_AMBULATORY_CARE_PROVIDER_SITE_OTHER): Payer: BC Managed Care – PPO | Admitting: Psychology

## 2020-11-29 DIAGNOSIS — F418 Other specified anxiety disorders: Secondary | ICD-10-CM

## 2020-12-13 ENCOUNTER — Ambulatory Visit (INDEPENDENT_AMBULATORY_CARE_PROVIDER_SITE_OTHER): Payer: BC Managed Care – PPO | Admitting: Psychology

## 2020-12-13 DIAGNOSIS — F418 Other specified anxiety disorders: Secondary | ICD-10-CM

## 2020-12-27 ENCOUNTER — Ambulatory Visit (INDEPENDENT_AMBULATORY_CARE_PROVIDER_SITE_OTHER): Payer: BC Managed Care – PPO | Admitting: Psychology

## 2020-12-27 DIAGNOSIS — F418 Other specified anxiety disorders: Secondary | ICD-10-CM | POA: Diagnosis not present

## 2021-01-10 ENCOUNTER — Ambulatory Visit (INDEPENDENT_AMBULATORY_CARE_PROVIDER_SITE_OTHER): Payer: BC Managed Care – PPO | Admitting: Psychology

## 2021-01-10 DIAGNOSIS — F418 Other specified anxiety disorders: Secondary | ICD-10-CM | POA: Diagnosis not present

## 2021-01-24 ENCOUNTER — Ambulatory Visit (INDEPENDENT_AMBULATORY_CARE_PROVIDER_SITE_OTHER): Payer: BC Managed Care – PPO | Admitting: Psychology

## 2021-01-24 DIAGNOSIS — F418 Other specified anxiety disorders: Secondary | ICD-10-CM | POA: Diagnosis not present

## 2021-02-07 ENCOUNTER — Ambulatory Visit (INDEPENDENT_AMBULATORY_CARE_PROVIDER_SITE_OTHER): Payer: BC Managed Care – PPO | Admitting: Psychology

## 2021-02-07 DIAGNOSIS — F418 Other specified anxiety disorders: Secondary | ICD-10-CM | POA: Diagnosis not present

## 2021-02-21 ENCOUNTER — Ambulatory Visit: Payer: BC Managed Care – PPO | Admitting: Psychology

## 2021-02-23 ENCOUNTER — Ambulatory Visit (INDEPENDENT_AMBULATORY_CARE_PROVIDER_SITE_OTHER): Payer: BC Managed Care – PPO | Admitting: Psychology

## 2021-02-23 DIAGNOSIS — F418 Other specified anxiety disorders: Secondary | ICD-10-CM | POA: Diagnosis not present

## 2021-03-01 DIAGNOSIS — Z1231 Encounter for screening mammogram for malignant neoplasm of breast: Secondary | ICD-10-CM | POA: Diagnosis not present

## 2021-03-01 LAB — HM MAMMOGRAPHY

## 2021-03-06 ENCOUNTER — Encounter: Payer: Self-pay | Admitting: Family Medicine

## 2021-03-07 ENCOUNTER — Ambulatory Visit: Payer: BC Managed Care – PPO | Admitting: Psychology

## 2021-03-07 ENCOUNTER — Ambulatory Visit (INDEPENDENT_AMBULATORY_CARE_PROVIDER_SITE_OTHER): Payer: BC Managed Care – PPO | Admitting: Psychology

## 2021-03-07 DIAGNOSIS — F418 Other specified anxiety disorders: Secondary | ICD-10-CM | POA: Diagnosis not present

## 2021-03-21 ENCOUNTER — Ambulatory Visit (INDEPENDENT_AMBULATORY_CARE_PROVIDER_SITE_OTHER): Payer: BC Managed Care – PPO | Admitting: Psychology

## 2021-03-21 DIAGNOSIS — F418 Other specified anxiety disorders: Secondary | ICD-10-CM | POA: Diagnosis not present

## 2021-04-18 ENCOUNTER — Ambulatory Visit (INDEPENDENT_AMBULATORY_CARE_PROVIDER_SITE_OTHER): Payer: BC Managed Care – PPO | Admitting: Psychology

## 2021-04-18 DIAGNOSIS — F418 Other specified anxiety disorders: Secondary | ICD-10-CM

## 2021-05-02 ENCOUNTER — Ambulatory Visit (INDEPENDENT_AMBULATORY_CARE_PROVIDER_SITE_OTHER): Payer: BC Managed Care – PPO | Admitting: Psychology

## 2021-05-02 DIAGNOSIS — F418 Other specified anxiety disorders: Secondary | ICD-10-CM | POA: Diagnosis not present

## 2021-05-16 ENCOUNTER — Ambulatory Visit (INDEPENDENT_AMBULATORY_CARE_PROVIDER_SITE_OTHER): Payer: BC Managed Care – PPO | Admitting: Psychology

## 2021-05-16 DIAGNOSIS — F418 Other specified anxiety disorders: Secondary | ICD-10-CM

## 2021-05-21 DIAGNOSIS — L820 Inflamed seborrheic keratosis: Secondary | ICD-10-CM | POA: Diagnosis not present

## 2021-05-21 DIAGNOSIS — D2262 Melanocytic nevi of left upper limb, including shoulder: Secondary | ICD-10-CM | POA: Diagnosis not present

## 2021-05-21 DIAGNOSIS — L918 Other hypertrophic disorders of the skin: Secondary | ICD-10-CM | POA: Diagnosis not present

## 2021-05-21 DIAGNOSIS — D2261 Melanocytic nevi of right upper limb, including shoulder: Secondary | ICD-10-CM | POA: Diagnosis not present

## 2021-05-21 DIAGNOSIS — D225 Melanocytic nevi of trunk: Secondary | ICD-10-CM | POA: Diagnosis not present

## 2021-05-22 ENCOUNTER — Encounter: Payer: Self-pay | Admitting: Family Medicine

## 2021-05-22 ENCOUNTER — Ambulatory Visit: Payer: BC Managed Care – PPO | Admitting: Family Medicine

## 2021-05-22 VITALS — BP 118/78 | HR 70 | Temp 98.0°F | Resp 16 | Ht 63.0 in | Wt 199.0 lb

## 2021-05-22 DIAGNOSIS — E782 Mixed hyperlipidemia: Secondary | ICD-10-CM | POA: Diagnosis not present

## 2021-05-22 DIAGNOSIS — F419 Anxiety disorder, unspecified: Secondary | ICD-10-CM | POA: Diagnosis not present

## 2021-05-22 DIAGNOSIS — J302 Other seasonal allergic rhinitis: Secondary | ICD-10-CM | POA: Diagnosis not present

## 2021-05-22 DIAGNOSIS — R7303 Prediabetes: Secondary | ICD-10-CM | POA: Diagnosis not present

## 2021-05-22 DIAGNOSIS — E669 Obesity, unspecified: Secondary | ICD-10-CM

## 2021-05-22 DIAGNOSIS — Z6833 Body mass index (BMI) 33.0-33.9, adult: Secondary | ICD-10-CM

## 2021-05-22 LAB — COMPREHENSIVE METABOLIC PANEL
ALT: 45 U/L — ABNORMAL HIGH (ref 0–35)
AST: 34 U/L (ref 0–37)
Albumin: 4.5 g/dL (ref 3.5–5.2)
Alkaline Phosphatase: 65 U/L (ref 39–117)
BUN: 13 mg/dL (ref 6–23)
CO2: 29 mEq/L (ref 19–32)
Calcium: 9.7 mg/dL (ref 8.4–10.5)
Chloride: 99 mEq/L (ref 96–112)
Creatinine, Ser: 0.84 mg/dL (ref 0.40–1.20)
GFR: 84.46 mL/min (ref >60.00)
Glucose, Bld: 95 mg/dL (ref 70–99)
Potassium: 4.3 mEq/L (ref 3.5–5.1)
Sodium: 135 mEq/L (ref 135–145)
Total Bilirubin: 0.5 mg/dL (ref 0.2–1.2)
Total Protein: 7.6 g/dL (ref 6.0–8.3)

## 2021-05-22 LAB — LIPID PANEL
Cholesterol: 190 mg/dL (ref 0–200)
HDL: 46.2 mg/dL (ref >39.00)
LDL Cholesterol: 105 mg/dL — ABNORMAL HIGH (ref 0–99)
NonHDL: 143.63
Total CHOL/HDL Ratio: 4
Triglycerides: 192 mg/dL — ABNORMAL HIGH (ref 0.0–149.0)
VLDL: 38.4 mg/dL (ref 0.0–40.0)

## 2021-05-22 LAB — HEMOGLOBIN A1C: Hgb A1c MFr Bld: 6.1 % (ref 4.6–6.5)

## 2021-05-22 NOTE — Assessment & Plan Note (Signed)
It has improved with Celexa 10 mg and CBT. She is not interested in increasing dose of Celexa for now.

## 2021-05-22 NOTE — Assessment & Plan Note (Signed)
We discussed benefits of wt loss as well as adverse effects of obesity. Consistency with healthy diet and physical activity encouraged. Calorie count will also help, 1700 Kcal/day.

## 2021-05-22 NOTE — Assessment & Plan Note (Signed)
Improved with humidifier. Continue nasal saline irrigations as needed, Zyrtec 10 mg daily,and Flonase nasal spray daily prn.

## 2021-05-22 NOTE — Patient Instructions (Addendum)
A few things to remember from today's visit:  Hyperlipemia, mixed - Plan: Comprehensive metabolic panel, Lipid panel  Prediabetes - Plan: Hemoglobin A1c  Anxiety disorder, unspecified type  No changes today. Try to walk 15 min daily and count calories, 1700 Kcal/day.  If you need refills please call your pharmacy. Do not use My Chart to request refills or for acute issues that need immediate attention.   Please be sure medication list is accurate. If a new problem present, please set up appointment sooner than planned today.  Allergic Rhinitis, Adult Allergic rhinitis is an allergic reaction that affects the mucous membrane inside the nose. The mucous membrane is the tissue that produces mucus. There are two types of allergic rhinitis: Seasonal. This type is also called hay fever and happens only during certain seasons. Perennial. This type can happen at any time of the year. Allergic rhinitis cannot be spread from person to person. This condition can be mild, moderate, or severe. It can develop at any age and may be outgrown. What are the causes? This condition is caused by allergens. These are things that can cause an allergic reaction. Allergens may differ for seasonal allergic rhinitis and perennial allergic rhinitis. Seasonal allergic rhinitis is triggered by pollen. Pollen can come from grasses, trees, and weeds. Perennial allergic rhinitis may be triggered by: Dust mites. Proteins in a pet's urine, saliva, or dander. Dander is dead skin cells from a pet. Smoke, mold, or car fumes. What increases the risk? You are more likely to develop this condition if you have a family history of allergies or other conditions related to allergies, including: Allergic conjunctivitis. This is inflammation of parts of the eyes and eyelids. Asthma. This condition affects the lungs and makes it hard to breathe. Atopic dermatitis or eczema. This is long term (chronic) inflammation of the  skin. Food allergies. What are the signs or symptoms? Symptoms of this condition include: Sneezing or coughing. A stuffy nose (nasal congestion), itchy nose, or nasal discharge. Itchy eyes and tearing of the eyes. A feeling of mucus dripping down the back of your throat (postnasal drip). Trouble sleeping. Tiredness or fatigue. Headache. Sore throat. How is this diagnosed? This condition may be diagnosed with your symptoms, medical history, and physical exam. Your health care provider may check for related conditions, such as: Asthma. Pink eye. This is eye inflammation caused by infection (conjunctivitis). Ear infection. Upper respiratory infection. This is an infection in the nose, throat, or upper airways. You may also have tests to find out which allergens trigger your symptoms. These may include skin tests or blood tests. How is this treated? There is no cure for this condition, but treatment can help control symptoms. Treatment may include: Taking medicines that block allergy symptoms, such as corticosteroids and antihistamines. Medicine may be given as a shot, nasal spray, or pill. Avoiding any allergens. Being exposed again and again to tiny amounts of allergens to help you build a defense against allergens (immunotherapy). This is done if other treatments have not helped. It may include: Allergy shots. These are injected medicines that have small amounts of allergen in them. Sublingual immunotherapy. This involves taking small doses of a medicine with allergen in it under your tongue. If these treatments do not work, your health care provider may prescribe newer, stronger medicines. Follow these instructions at home: Avoiding allergens Find out what you are allergic to and avoid those allergens. These are some things you can do to help avoid allergens: If you  have perennial allergies: Replace carpet with wood, tile, or vinyl flooring. Carpet can trap dander and dust. Do not  smoke. Do not allow smoking in your home. Change your heating and air conditioning filters at least once a month. If you have seasonal allergies, take these steps during allergy season: Keep windows closed as much as possible. Plan outdoor activities when pollen counts are lowest. Check pollen counts before you plan outdoor activities. When coming indoors, change clothing and shower before sitting on furniture or bedding. If you have a pet in the house that produces allergens: Keep the pet out of the bedroom. Vacuum, sweep, and dust regularly. General instructions Take over-the-counter and prescription medicines only as told by your health care provider. Drink enough fluid to keep your urine pale yellow. Keep all follow-up visits as told by your health care provider. This is important. Where to find more information American Academy of Allergy, Asthma & Immunology: www.aaaai.org Contact a health care provider if: You have a fever. You develop a cough that does not go away. You make whistling sounds when you breathe (wheeze). Your symptoms slow you down or stop you from doing your normal activities each day. Get help right away if: You have shortness of breath. This symptom may represent a serious problem that is an emergency. Do not wait to see if the symptom will go away. Get medical help right away. Call your local emergency services (911 in the [place] Do not drive yourself to the hospital. Summary Allergic rhinitis may be managed by taking medicines as directed and avoiding allergens. If you have seasonal allergies, keep windows closed as much as possible during allergy season. Contact your health care provider if you develop a fever or a cough that does not go away. This information is not intended to replace advice given to you by your health care provider. Make sure you discuss any questions you have with your health care provider. Document Revised: 08/06/2019 Document Reviewed:  06/15/2019 Elsevier Patient Education  2022 [employer].

## 2021-05-22 NOTE — Progress Notes (Signed)
HPI: [name] is a 45 y.o. female, who is here today for chronic disease management.  Last seen on 10/10/20.  Hyperlipidemia: Currently on Rosuvastatin 10 mg daily. Following a low fat diet: She does not eat fried food.. Side effects from medication:None. FHx of HLD, no premature CAD or heart disease.  Lab Results  Component Value Date   CHOL 257 (H) 10/10/2020   HDL 41.00 10/10/2020   LDLCALC 162 (H) 05/07/2016   LDLDIRECT 174.0 10/10/2020   TRIG 259.0 (H) 10/10/2020   CHOLHDL 6 10/10/2020   Negative for abdominal pain, nausea,vomiting, polydipsia,polyuria, or polyphagia.  Lab Results  Component Value Date   HGBA1C 6.1 10/10/2020  She is not exercising regularly but she is active, helping with the school marching band. + fatigue,worse for about 2 weeks. She is on Celexa 10 mg daily. Still feeling anxious but she feels like medication has helped.  CBT for the past 2-3 years. She has not have panic attacks and racing thoughts have improved. Reporting hx of seasonal depression, stable.  She had COVID 19 on 03/13/2021.  +Nasal congestion, rhinorrhea. Zyrtec 10 mg daily. Flonase nasal spray not often. Nasal saline irrigations. Usually she has a humidifier around this time of the year, did set it up last night and feels better today.  Review of Systems  Constitutional:  Negative for activity change, appetite change, chills and fever.  HENT:  Negative for mouth sores, nosebleeds, sinus pain and sore throat.   Respiratory:  Negative for cough, shortness of breath and wheezing.   Allergic/Immunologic: Positive for environmental allergies.  Neurological:  Negative for syncope, weakness and headaches.  Hematological:  Negative for adenopathy. Does not bruise/bleed easily.  Psychiatric/Behavioral:  Negative for confusion. The patient is nervous/anxious.   Rest of ROS see pertinent positives and negatives in HPI.  Current Outpatient Medications on File Prior to  Visit  Medication Sig Dispense Refill   albuterol (PROVENTIL HFA) 108 (90 Base) MCG/ACT inhaler Inhale 2 puffs into the lungs every 6 (six) hours as needed for wheezing or shortness of breath. 1 Inhaler 3   cetirizine (ZYRTEC) 10 MG tablet Take 10 mg by mouth daily.     Cholecalciferol (VITAMIN D3 PO) Take 2,000 mg by mouth daily.      citalopram (CELEXA) 10 MG tablet Take 1 tablet (10 mg total) by mouth daily. PT NEEDS APPT. 90 tablet 3   LUTEIN PO Take 1 capsule by mouth daily.     Multiple Vitamins-Minerals (MULTIVITAMIN WITH MINERALS) tablet Take 1 tablet by mouth daily.     Omega-3 Fatty Acids (FISH OIL) 1000 MG CAPS Take by mouth.     Probiotic Product (PROBIOTIC PO) Take 1 tablet by mouth every other day.     PSYLLIUM HUSK PO Take 2 capsules by mouth daily.     rosuvastatin (CRESTOR) 10 MG tablet Take 1 tablet (10 mg total) by mouth daily. 30 tablet 6   SPIRULINA PO Take 1 tablet by mouth daily. For food allergy prevention     Triamcinolone Acetonide (TRIAMCINOLONE 0.1 % CREAM : EUCERIN) CREA Apply 1 application topically 2 (two) times daily as needed for rash or itching. 500 each 1   Turmeric Curcumin 500 MG CAPS Take by mouth.     omeprazole (PRILOSEC) 20 MG capsule Take 1 capsule (20 mg total) by mouth daily before breakfast. (Patient not taking: Reported on 05/22/2021) 30 capsule 1   No current facility-administered medications on file prior to visit.   Past  Medical History:  Diagnosis Date   ALLERGIC RHINITIS 05/31/2008   Anemia    ANXIETY 05/31/2008   ASTHMA 05/31/2008   Asthma    CHOLELITHIASIS 05/31/2008   High blood pressure    readings   History of kidney stones    History of right breast biopsy 2017   HYPERLIPIDEMIA 05/31/2008   Kidney stone    NEPHROLITHIASIS, HX OF 05/31/2008   Allergies  Allergen Reactions   Beef-Derived Products Diarrhea    Nausea and vomiting   Prochlorperazine Edisylate Shortness Of Breath    Compazine Tongue swelling   Prochlorperazine  Edisylate Swelling    Swelling of throat   Tree Extract Anaphylaxis    Throat itchy, roof of mouth gets raw   Banana Itching and Nausea And Vomiting    Throat gets itchy   Beef (Bovine) Protein     Diarrhea and stomach cramps    Oxycodone-Acetaminophen     Vomiting   Permethrin Rash    Rash and fever   Statins Itching   Morphine And Related Itching   Other     Tree Nuts (can have peanuts and cashews) Pyrethrine (ingredient in lice products)- fever and rash   Oxycodone-Acetaminophen Nausea And Vomiting   Latex Hives and Rash    Rash    Social History   Socioeconomic History   Marital status: Married    Spouse name: Not on file   Number of children: 3   Years of education: BS   Highest education level: Not on file  Occupational History   Occupation: Soil scientist   Occupation: Substitute  Tobacco Use   Smoking status: Never   Smokeless tobacco: Never  Substance and Sexual Activity   Alcohol use: No   Drug use: No   Sexual activity: Yes    Birth control/protection: Surgical  Other Topics Concern   Not on file  Social History Narrative   Lives with husband & 3 kids, work at home, PT outside of home. Often volunteers.   Social Determinants of Health   Financial Resource Strain: Not on file  Food Insecurity: Not on file  Transportation Needs: Not on file  Physical Activity: Not on file  Stress: Not on file  Social Connections: Not on file   Vitals:   05/22/21 1107  BP: 118/78  Pulse: 70  Resp: 16  Temp: 98 F (36.7 C)  SpO2: 99%   Wt Readings from Last 3 Encounters:  05/22/21 199 lb (90.3 kg)  10/10/20 198 lb 8 oz (90 kg)  06/09/19 193 lb 4 oz (87.7 kg)   Body mass index is 35.25 kg/m.  Physical Exam Vitals and nursing note reviewed.  Constitutional:      General: She is not in acute distress.    Appearance: She is well-developed.  HENT:     Head: Normocephalic and atraumatic.     Nose: Rhinorrhea present.     Right Turbinates: Enlarged.     Right  Sinus: No maxillary sinus tenderness or frontal sinus tenderness.     Left Sinus: No maxillary sinus tenderness or frontal sinus tenderness.     Mouth/Throat:     Mouth: Mucous membranes are moist.     Pharynx: Oropharynx is clear.  Eyes:     Conjunctiva/sclera: Conjunctivae normal.  Cardiovascular:     Rate and Rhythm: Normal rate and regular rhythm.     Pulses:          Dorsalis pedis pulses are 2+ on the right side and 2+  on the left side.     Heart sounds: No murmur heard. Pulmonary:     Effort: Pulmonary effort is normal. No respiratory distress.     Breath sounds: Normal breath sounds.  Abdominal:     Palpations: Abdomen is soft. There is no hepatomegaly or mass.     Tenderness: There is no abdominal tenderness.  Lymphadenopathy:     Cervical: No cervical adenopathy.  Skin:    General: Skin is warm.     Findings: No erythema or rash.  Neurological:     General: No focal deficit present.     Mental Status: She is alert and oriented to person, place, and time.     Cranial Nerves: No cranial nerve deficit.     Gait: Gait normal.  Psychiatric:     Comments: Well groomed, good eye contact.   ASSESSMENT AND PLAN:  [name] was seen today for follow-up.  Diagnoses and all orders for this visit: Orders Placed This Encounter  Procedures   Comprehensive metabolic panel   Hemoglobin A1c   Lipid panel   Lab Results  Component Value Date   CHOL 190 05/22/2021   HDL 46.20 05/22/2021   LDLCALC 105 (H) 05/22/2021   LDLDIRECT 174.0 10/10/2020   TRIG 192.0 (H) 05/22/2021   CHOLHDL 4 05/22/2021   Lab Results  Component Value Date   CREATININE 0.84 05/22/2021   BUN 13 05/22/2021   NA 135 05/22/2021   K 4.3 05/22/2021   CL 99 05/22/2021   CO2 29 05/22/2021   Lab Results  Component Value Date   ALT 45 (H) 05/22/2021   AST 34 05/22/2021   ALKPHOS 65 05/22/2021   BILITOT 0.5 05/22/2021   Lab Results  Component Value Date   HGBA1C 6.1 05/22/2021  The 10-year ASCVD  risk score (Arnett DK, et al., 2019) is: 0.8%   Values used to calculate the score:     Age: 27 years     Sex: Female     Is Non-Hispanic African American: No     Diabetic: No     Tobacco smoker: No     Systolic Blood Pressure: 665 mmHg     Is BP treated: No     HDL Cholesterol: 46.2 mg/dL     Total Cholesterol: 190 mg/dL  Prediabetes A healthy life style encouraged for diabetes prevention. Further recommendations according to HgA1C result.  Allergic rhinitis Improved with humidifier. Continue nasal saline irrigations as needed, Zyrtec 10 mg daily,and Flonase nasal spray daily prn.  Anxiety disorder, unspecified It has improved with Celexa 10 mg and CBT. She is not interested in increasing dose of Celexa for now.  Hyperlipemia, mixed Continue Crestor 10 mg daily and low fat diet. Further recommendations according to FLP results.  Class 1 obesity with body mass index (BMI) of 33.0 to 33.9 in adult We discussed benefits of wt loss as well as adverse effects of obesity. Consistency with healthy diet and physical activity encouraged. Calorie count will also help, 1700 Kcal/day.  Return in about 6 months (around 11/19/2021) for cpe.  Angela Harrell G. [place], MD  [facility]. [place] office.

## 2021-05-22 NOTE — Assessment & Plan Note (Signed)
Continue Crestor 10 mg daily and low fat diet. Further recommendations according to FLP results.

## 2021-05-25 MED ORDER — ROSUVASTATIN CALCIUM 10 MG PO TABS: 10.0000 mg | ORAL_TABLET | Freq: Every day | ORAL | 3 refills | Status: DC

## 2021-05-30 ENCOUNTER — Ambulatory Visit: Payer: BC Managed Care – PPO | Admitting: Psychology

## 2021-06-13 ENCOUNTER — Ambulatory Visit (INDEPENDENT_AMBULATORY_CARE_PROVIDER_SITE_OTHER): Payer: BC Managed Care – PPO | Admitting: Psychology

## 2021-06-13 DIAGNOSIS — F418 Other specified anxiety disorders: Secondary | ICD-10-CM | POA: Diagnosis not present

## 2021-06-13 NOTE — Progress Notes (Signed)
[place] Counselor/Therapist Progress Note  Patient ID: Angela Harrell, MRN: [id number]    Date: 06/13/21  Time Spent: 10:03  am - 11:01 am : 58 Minutes  Treatment Type: Individual Therapy.  Reported Symptoms: Depression, tearfulness, panic symptoms, interpersonal stressors.    Mental Status Exam: Appearance:  Fairly Groomed     Behavior: Appropriate  Motor: Normal  Speech/Language:  Normal Rate  Affect: Depressed  Mood: depressed and sad  Thought process: normal  Thought content:   WNL  Sensory/Perceptual disturbances:   WNL  Orientation: oriented to person, place, time/date, and situation  Attention: Good  Concentration: Good  Memory: WNL  Fund of knowledge:  Good  Insight:   Good  Judgment:  Good  Impulse Control: Good   Risk Assessment: Danger to Self:  No Self-injurious Behavior: No Danger to Others: No Duty to Warn:no Physical Aggression / Violence:No  Access to Firearms a concern: No  Gang Involvement:No   Subjective:   Angela Harrell participated from home, via video, and consented to treatment. Therapist participated from home office. We met online due to [place] pandemic. Angela Harrell reviewed the events of the past week. Angela Harrell  discussed a sudden and predictable rise in specific symptoms prior to her cycle and was tearful during the recall of this. She endorsed numerous symptoms of PMDD and would benefit from a psychiatric consult. Therapist provided resources during the session to be followed-up on by Angela Harrell. 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 We explored these dynamics, during the session, and her attempts to manage them along with the effect on her overall mood. We reviewed assertiveness, positive communication, and boundary setting. Angela Harrell was engaged and motivated during the session and expressed her commitment towards our goals.  Therapist validated Angela Harrell experience and feelings and provided supportive therapy.    Interventions: Cognitive Behavioral Therapy, Assertiveness/Communication, and Interpersonal  Diagnosis: Other specified anxiety disorders   Treatment Plan:  Client Abilities/Strengths Angela Harrell is intelligent, forthcoming, and motivated for change.  Client Treatment Preferences Outpatient Therapy  Client Statement of Needs Angela Harrell would like to work on managing her symptoms, processing past events, and bolster coping skills.   Treatment Level Biweekly  Symptoms  Feeling nervous, hx of panic, difficulty managing anxiety, difficulty relaxing, restlessness, infrequent irritability, loss of interest, feeling down, middle insomnia, lethargy, fluctuating appetite, feeling bad about self, and increased difficulty managing attention and concentration.   Goals:   Therapist will provide referrals for additional resources as appropriate.  Angela Harrell will employ CBT, BA, Problem-solving, Solution Focused, Mindfulness, and coping skills to help manage decrease symptoms associated with her diagnosis.   Reduce overall level, frequency, and intensity of the feelings of depression and anxiety evidenced by decreased sadness, worry, and rumination from 6 to 7 days/week to 0 to 1 days/week per client report for at least 3 consecutive months. Verbally express understanding of the relationship between feelings of depression, anxiety and their impact on thinking patterns and behaviors. Verbalize an understanding of the role that distorted thinking plays in creating fears, excessive worry, and ruminations.   Target Date: 06/15/21 Frequency: Bi-weekly  Progress: 0 Modality: individual     Angela Irish, [name]

## 2021-07-11 ENCOUNTER — Ambulatory Visit (INDEPENDENT_AMBULATORY_CARE_PROVIDER_SITE_OTHER): Payer: BC Managed Care – PPO | Admitting: Psychology

## 2021-07-11 DIAGNOSIS — F418 Other specified anxiety disorders: Secondary | ICD-10-CM | POA: Diagnosis not present
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
## 2021-07-25 ENCOUNTER — Ambulatory Visit (INDEPENDENT_AMBULATORY_CARE_PROVIDER_SITE_OTHER): Payer: BC Managed Care – PPO | Admitting: Psychology

## 2021-07-25 DIAGNOSIS — F418 Other specified anxiety disorders: Secondary | ICD-10-CM

## 2021-07-25 NOTE — Progress Notes (Signed)
[facility] Counselor/Therapist Progress Note  Patient ID: Angela Harrell, MRN: [id number]   Date: 07/25/21  Time Spent: 10:08  am - 11:00 am : 52 Minutes  Treatment Type: Individual Therapy.  Reported Symptoms: depression and anxiety.  Mental Status Exam: Appearance:  Neat and Well Groomed     Behavior: Appropriate  Motor: Normal  Speech/Language:  Clear and Coherent  Affect: Appropriate  Mood: normal  Thought process: normal  Thought content:   WNL  Sensory/Perceptual disturbances:   WNL  Orientation: oriented to person, place, time/date, and situation  Attention: Good  Concentration: Good  Memory: WNL  Fund of knowledge:  Good  Insight:   Good  Judgment:  Good  Impulse Control: Good   Risk Assessment: Danger to Self:  No Self-injurious Behavior: No Danger to Others: No Duty to Warn:no Physical Aggression / Violence:No  Access to Firearms a concern: No  Gang Involvement:No   Subjective:   Angela Harrell participated from home, via video and consented to treatment. Therapist participated from home office. We met online due to [place] pandemic. Angela Harrell reviewed the events of the past week. We reviewed numerous treatment approaches including CBT, BA, Problem Solving, and Solution focused therapy. Psych-education regarding the Angela Harrell's diagnosis of Other specified anxiety disorders was provided during the session. We discussed Angela Harrell goals treatment goals which include engaging in self-care, engaging in enjoyable activities, process past events and current interpersonal dynamics, manage overall symptoms, engage in self-care, and bolster coping skills. Angela Harrell provided verbal approval of the treatment plan. Angela Harrell noted her assumption that others are often critical of her and noted wearing the mask, during the pandemic, being helpful in managing this. She highlighted her experience with her father, in childhood, who was highly critical and often displaced his  anger and frustration towards his children. She noted continued frustration with her father's behavior and her rumination regarding her father's treatment of her in childhood and adulthood. Therapist validated Angela Harrell's feelings and experience and provided supportive therapy.    Interventions:  Goal Setting and psycho-education  Diagnosis:  Other specified anxiety disorders   Treatment Plan:  Client Abilities/Strengths Angela Harrell is intelligent, forthcoming, and motivated for change. She has responded well to treatment, overall.   Support System: Husband, friends, mother.   Client Treatment Preferences Outpatient Therapy.   Client Statement of Needs Angela Harrell would like to engage in self-care, engage in enjoyable activities, process past events and current interpersonal dynamics, manage overall symptoms, engage in self-care, and bolster coping skills.   Treatment Level Weekly  Symptoms Anxiety: Feeling nervous, difficulty managing anxiety, difficulty relaxing, restlessness, irritability, rumination   (Status: maintained) Depression: Loss of interest, feeling down, middle insomnia, lethargy, feeling bad about self, difficulty managing attention/concentration.   (Status: maintained)  Goals:  Angela Harrell experiences symptoms of depression and anxiety.   Target Date: 07/25/22 Frequency: Weekly  Progress: 0 Modality: individual    Therapist will provide referrals for additional resources as appropriate.  Therapist will provide psycho-education regarding Angela Harrell's diagnosis and corresponding treatment approaches and interventions. Licensed Clinical Social Worker, [name], [name] will support the patient's ability to achieve the goals identified. will employ CBT, BA, Problem-solving, Solution Focused, Mindfulness,  coping skills, & other evidenced-based practices will be used to promote progress towards healthy functioning to help manage decrease symptoms associated with her diagnosis.   Reduce  overall level, frequency, and intensity of the feelings of depression and anxiety evidenced by decreased overall symptoms from 6 to 7 days/week to 0 to 1 days/week  per client report for at least 3 consecutive months. Verbally express understanding of the relationship between feelings of depression, anxiety and their impact on thinking patterns and behaviors. Verbalize an understanding of the role that distorted thinking plays in creating fears, excessive worry, and ruminations. Meet with psychiatric provider.     [name] participated in the creation of the treatment plan)    [name], [name]

## 2021-08-08 ENCOUNTER — Ambulatory Visit (INDEPENDENT_AMBULATORY_CARE_PROVIDER_SITE_OTHER): Payer: BC Managed Care – PPO | Admitting: Psychology

## 2021-08-08 DIAGNOSIS — F418 Other specified anxiety disorders: Secondary | ICD-10-CM | POA: Diagnosis not present
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
## 2021-08-22 ENCOUNTER — Ambulatory Visit (INDEPENDENT_AMBULATORY_CARE_PROVIDER_SITE_OTHER): Payer: BC Managed Care – PPO | Admitting: Psychology

## 2021-08-22 DIAGNOSIS — F418 Other specified anxiety disorders: Secondary | ICD-10-CM | POA: Diagnosis not present

## 2021-08-22 NOTE — Progress Notes (Signed)
[facility] Counselor/Therapist Progress Note  Patient ID: Angela Harrell, MRN: [id number]   Date: 08/22/21  Time Spent: 10:06  am - 11:02 am : 56 Minutes  Treatment Type: Individual Therapy.  Reported Symptoms: depression and anxiety.  Mental Status Exam: Appearance:  Neat and Well Groomed     Behavior: Appropriate  Motor: Normal  Speech/Language:  Clear and Coherent  Affect: Appropriate  Mood: normal  Thought process: normal  Thought content:   WNL  Sensory/Perceptual disturbances:   WNL  Orientation: oriented to person, place, time/date, and situation  Attention: Good  Concentration: Good  Memory: WNL  Fund of knowledge:  Good  Insight:   Good  Judgment:  Good  Impulse Control: Good   Risk Assessment: Danger to Self:  No Self-injurious Behavior: No Danger to Others: No Duty to Warn:no Physical Aggression / Violence:No  Access to Firearms a concern: No  Gang Involvement:No   Subjective:   Angela Harrell participated from home, via video and consented to treatment. Therapist participated from home office. We met online due to [place] pandemic. Sayra reviewed the events of the past week. Jessicia noted beng more engaged in enjoyable activities, attending estate sales, and feeling excitement regarding these activities. She noted improvement in mood as a result of staying busy. She noted interpersonal stressors, with a volunteer at school, and the effect of this on her mood. We explored these previous experiences, the effect on her mood, and her attempts to manage these stressors, in the past. We reviewed clear, positive, and assertive communication during the session, which therapist modeled. Dreamer noted feeling unconfident in some setting when interacting with adults. We began processing this during the session. Doroteo Bradford has an initial appointment with her new psychiatric provider, [name] with [facility]. Therapist validated Kynlea's feelings and  experience and provided supportive therapy.   Interventions: Interpersonal  Diagnosis:  Other specified anxiety disorders   Treatment Plan:  Client Abilities/Strengths Alvira is intelligent, forthcoming, and motivated for change. She has responded well to treatment, overall.   Support System: Husband, friends, mother.   Client Treatment Preferences Outpatient Therapy.   Client Statement of Needs Levana would like to engage in self-care, engage in enjoyable activities, process past events and current interpersonal dynamics, manage overall symptoms, engage in self-care, and bolster coping skills.   Treatment Level Weekly  Symptoms Anxiety: Feeling nervous, difficulty managing anxiety, difficulty relaxing, restlessness, irritability, rumination   (Status: maintained) Depression: Loss of interest, feeling down, middle insomnia, lethargy, feeling bad about self, difficulty managing attention/concentration.   (Status: improved)  Goals:  Chanteria experiences symptoms of depression and anxiety.   Target Date: 07/25/22 Frequency: Weekly  Progress: 0 Modality: individual    Therapist will provide referrals for additional resources as appropriate.  Therapist will provide psycho-education regarding Maxene's diagnosis and corresponding treatment approaches and interventions. Licensed Clinical Social Worker, [place], [name] will support the patient's ability to achieve the goals identified. will employ CBT, BA, Problem-solving, Solution Focused, Mindfulness,  coping skills, & other evidenced-based practices will be used to promote progress towards healthy functioning to help manage decrease symptoms associated with her diagnosis.   Reduce overall level, frequency, and intensity of the feelings of depression and anxiety evidenced by decreased overall symptoms from 6 to 7 days/week to 0 to 1 days/week per client report for at least 3 consecutive months. Verbally express understanding of the  relationship between feelings of depression, anxiety and their impact on thinking patterns and behaviors. Verbalize an understanding of the  role that distorted thinking plays in creating fears, excessive worry, and ruminations. Meet with psychiatric provider.     [name] participated in the creation of the treatment plan)  [name], [name]

## 2021-08-29 ENCOUNTER — Ambulatory Visit (INDEPENDENT_AMBULATORY_CARE_PROVIDER_SITE_OTHER): Payer: BC Managed Care – PPO | Admitting: Adult Health

## 2021-08-29 ENCOUNTER — Encounter: Payer: Self-pay | Admitting: Adult Health

## 2021-08-29 ENCOUNTER — Other Ambulatory Visit: Payer: Self-pay

## 2021-08-29 VITALS — BP 132/85 | HR 103 | Ht 63.0 in | Wt 202.0 lb

## 2021-08-29 DIAGNOSIS — F411 Generalized anxiety disorder: Secondary | ICD-10-CM

## 2021-08-29 DIAGNOSIS — F3281 Premenstrual dysphoric disorder: Secondary | ICD-10-CM | POA: Diagnosis not present

## 2021-08-29 MED ORDER — SERTRALINE HCL 50 MG PO TABS: ORAL_TABLET | ORAL | 2 refills | Status: DC

## 2021-08-29 NOTE — Progress Notes (Signed)
[facility] MD/PA/NP Initial Note  08/29/2021 4:13 PM Angela Harrell  MRN:  [id number]  Chief Complaint:   HPI:   Referred by therapist for possible PMDD and GAD.  Describes mood today as "so-so". Pleasant. Tearful throughout interview. Mood symptoms - reports depression, anxiety, and irritability. Stating "I'm managing". Feels like she may be suffering from PMDD - referred by therapist to discuss. Reports doing well for a good portion of the month and then has a week of emotional instability. Not able to handle things. Reports depression manifesting as a loss of motivation. Feels anxious at times - kids - big changes - "I'm a big planner". Hard to stay focused when not interested. Difficulties concentrating. Decreased energy. During the week before menstrual cycle, craves food and has appetite changes. Stating "I can't get full". Reports insomnia prior to cycle. Reports physical symptoms breast tenderness and bloating. Reports worry and rumination. Stating  "things get stuck in my head and go in a loop". Feels like it is better since seeing therapist. Willing to consider medication as an option. Varying interest and motivation. Taking current medications as prescribed.  Energy levels pretty low for the past 2 years. Active, does not have a regular exercise routine.   Enjoys some usual interests and activities. Married 23 years, together 25 years. Has 3 children 15, 17, and 19. Parents local - grandmother. Spending time with family. Appetite adequate - not as good during her cycle. Weight gain - 202 pounds. Sleeps well most nights - struggles one week out of the month. Averages 6 to 7 hours. Focus and concentration stable. Completing tasks. Managing aspects of household. 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 Denies SI or HI.  Denies AH or VH.  History of seasonal depression.  Previous medication trials: Celexa 10mg  daily  Visit Diagnosis:    ICD-10-CM   1. PMDD (premenstrual dysphoric disorder)   F32.81 sertraline (ZOLOFT) 50 MG tablet    2. Generalized anxiety disorder  F41.1 sertraline (ZOLOFT) 50 MG tablet      Past Psychiatric History: Denies psychiatric hospitalization.   Past Medical History:  Past Medical History:  Diagnosis Date   ALLERGIC RHINITIS 05/31/2008   Anemia    ANXIETY 05/31/2008   ASTHMA 05/31/2008   Asthma    CHOLELITHIASIS 05/31/2008   High blood pressure    readings   History of kidney stones    History of right breast biopsy 2017   HYPERLIPIDEMIA 05/31/2008   Kidney stone    NEPHROLITHIASIS, HX OF 05/31/2008    Past Surgical History:  Procedure Laterality Date   CESAREAN SECTION  2003,2005,2007   CHOLECYSTECTOMY  1999   KIDNEY STONE SURGERY     ORIF CONGENITAL HIP DISLOCATION      Family Psychiatric History: Family history of mental illness.   Family History:  Family History  Problem Relation Age of Onset   Breast cancer Mother 40   Arthritis Mother    Hyperlipidemia Father        triglycerides   Mental illness Brother        bipolar   Asthma Brother    Allergic Disorder Daughter    Cancer Maternal Uncle        mult myeloma   Vascular Disease Maternal Uncle    Heart disease Paternal Grandfather     Social History:  Social History   Socioeconomic History   Marital status: Married    Spouse name: Not on file   Number of children: 3   Years of education: BS  Highest education level: Not on file  Occupational History   Occupation: [employer]   Occupation: Substitute  Tobacco Use   Smoking status: Never   Smokeless tobacco: Never  Substance and Sexual Activity   Alcohol use: No   Drug use: No   Sexual activity: Yes    Birth control/protection: Surgical  Other Topics Concern   Not on file  Social History Narrative   Lives with husband & 3 kids, work at home, PT outside of home. Often volunteers.   Social Determinants of Health   Financial Resource Strain: Not on file  Food Insecurity: Not on file  Transportation Needs:  Not on file  Physical Activity: Not on file  Stress: Not on file  Social Connections: Not on file    Allergies:  Allergies  Allergen Reactions   Beef-Derived Products Diarrhea    Nausea and vomiting   Prochlorperazine Edisylate Shortness Of Breath    Compazine Tongue swelling   Prochlorperazine Edisylate Swelling    Swelling of throat   Tree Extract Anaphylaxis    Throat itchy, roof of mouth gets raw   Banana Itching and Nausea And Vomiting    Throat gets itchy   Beef (Bovine) Protein     Diarrhea and stomach cramps    Oxycodone-Acetaminophen     Vomiting   Permethrin Rash    Rash and fever   Statins Itching   Morphine And Related Itching   Other     Tree Nuts (can have peanuts and cashews) Pyrethrine (ingredient in lice products)- fever and rash   Oxycodone-Acetaminophen Nausea And Vomiting   Latex Hives and Rash    Rash    Metabolic Disorder Labs: Lab Results  Component Value Date   HGBA1C 6.1 05/22/2021   No results found for: PROLACTIN Lab Results  Component Value Date   CHOL 190 05/22/2021   TRIG 192.0 (H) 05/22/2021   HDL 46.20 05/22/2021   CHOLHDL 4 05/22/2021   VLDL 38.4 05/22/2021   LDLCALC 105 (H) 05/22/2021   LDLCALC 162 (H) 05/07/2016   Lab Results  Component Value Date   TSH 0.77 03/18/2018   TSH 0.93 11/09/2014    Therapeutic Level Labs: No results found for: LITHIUM No results found for: VALPROATE No components found for:  CBMZ  Current Medications: Current Outpatient Medications  Medication Sig Dispense Refill   sertraline (ZOLOFT) 50 MG tablet Take 1/2 tablet daily for 7 days, then increase to one tablet daily. 30 tablet 2   albuterol (PROVENTIL HFA) 108 (90 Base) MCG/ACT inhaler Inhale 2 puffs into the lungs every 6 (six) hours as needed for wheezing or shortness of breath. 1 Inhaler 3   cetirizine (ZYRTEC) 10 MG tablet Take 10 mg by mouth daily.     Cholecalciferol (VITAMIN D3 PO) Take 2,000 mg by mouth daily.      citalopram  (CELEXA) 10 MG tablet Take 1 tablet (10 mg total) by mouth daily. PT NEEDS APPT. 90 tablet 3   LUTEIN PO Take 1 capsule by mouth daily.     Multiple Vitamins-Minerals (MULTIVITAMIN WITH MINERALS) tablet Take 1 tablet by mouth daily.     Omega-3 Fatty Acids (FISH OIL) 1000 MG CAPS Take by mouth.     omeprazole (PRILOSEC) 20 MG capsule Take 1 capsule (20 mg total) by mouth daily before breakfast. (Patient not taking: Reported on 05/22/2021) 30 capsule 1   Probiotic Product (PROBIOTIC PO) Take 1 tablet by mouth every other day.     PSYLLIUM HUSK PO  Take 2 capsules by mouth daily.     rosuvastatin (CRESTOR) 10 MG tablet Take 1 tablet (10 mg total) by mouth daily. 90 tablet 3   SPIRULINA PO Take 1 tablet by mouth daily. For food allergy prevention     Triamcinolone Acetonide (TRIAMCINOLONE 0.1 % CREAM : EUCERIN) CREA Apply 1 application topically 2 (two) times daily as needed for rash or itching. 500 each 1   Turmeric Curcumin 500 MG CAPS Take by mouth.     No current facility-administered medications for this visit.    Medication Side Effects: none  Orders placed this visit:  No orders of the defined types were placed in this encounter.   Psychiatric Specialty Exam:  Review of Systems  Musculoskeletal:  Negative for gait problem.  Neurological:  Negative for tremors.  Psychiatric/Behavioral:         Please refer to HPI   Blood pressure 132/85, pulse (!) 103, height 5\' 3"  (1.6 m), weight 202 lb (91.6 kg).Body mass index is 35.78 kg/m.  General Appearance: Casual and Neat  Eye Contact:  Good  Speech:  Clear and Coherent and Normal Rate  Volume:  Normal  Mood:  Anxious  Affect:  Appropriate and Congruent  Thought Process:  Coherent and Descriptions of Associations: Intact  Orientation:  Full (Time, Place, and Person)  Thought Content: Logical   Suicidal Thoughts:  No  Homicidal Thoughts:  No  Memory:  WNL  Judgement:  Good  Insight:  Good  Psychomotor Activity:  Normal   Concentration:  Concentration: Good  Recall:  Good  Fund of Knowledge: Good  Language: Good  Assets:  Communication Skills Desire for Improvement Financial Resources/Insurance Housing Intimacy Leisure Time Physical Health Resilience Social Support Talents/Skills Transportation Vocational/Educational  ADL's:  Intact  Cognition: WNL  Prognosis:  Good   Screenings:  PHQ2-9    [facility] Office Visit from 05/22/2021 in [place] at [facility] from 07/17/2018 in [place] at [facility] from 03/18/2018 in [place] at [facility] from 11/09/2014 in [place]  PHQ-2 Total Score 0 2 3 1   PHQ-9 Total Score 3 6 8  --       Receiving Psychotherapy: Yes   Treatment Plan/Recommendations:  Plan:  PDMP reviewed  D/C Celexa 10mg  daily  Add Zoloft 50mg  daily  PMDD screening tool completed  Time spent with patient was 60 minutes. Greater than 50% of face to face time with patient was spent on counseling and coordination of care.   RTC 4 weeks  Patient advised to contact office with any questions, adverse effects, or acute worsening in signs and symptoms.      Aloha Gell, NP

## 2021-09-05 ENCOUNTER — Ambulatory Visit (INDEPENDENT_AMBULATORY_CARE_PROVIDER_SITE_OTHER): Payer: BC Managed Care – PPO | Admitting: Psychology

## 2021-09-05 DIAGNOSIS — F411 Generalized anxiety disorder: Secondary | ICD-10-CM

## 2021-09-05 DIAGNOSIS — F3281 Premenstrual dysphoric disorder: Secondary | ICD-10-CM | POA: Diagnosis not present

## 2021-09-05 NOTE — Progress Notes (Signed)
[facility] Counselor/Therapist Progress Note ? ?Patient ID: Angela Harrell, MRN: [id number]  ? ?Date: 09/05/21 ? ?Time Spent: 10:05  am - 11:00 am : 55 Minutes ? ?Treatment Type: Individual Therapy. ? ?Reported Symptoms: depression and anxiety. ? ?Mental Status Exam: ?Appearance:  Neat and Well Groomed     ?Behavior: Appropriate  ?Motor: Normal  ?Speech/Language:  Clear and Coherent  ?Affect: Appropriate  ?Mood: normal  ?Thought process: normal  ?Thought content:   WNL  ?Sensory/Perceptual disturbances:   WNL  ?Orientation: oriented to person, place, time/date, and situation  ?Attention: Good  ?Concentration: Good  ?Memory: WNL  ?Fund of knowledge:  Good  ?Insight:   Good  ?Judgment:  Good  ?Impulse Control: Good  ? ?Risk Assessment: ?Danger to Self:  No ?Self-injurious Behavior: No ?Danger to Others: No ?Duty to Warn:no ?Physical Aggression / Violence:No  ?Access to Firearms a concern: No  ?Gang Involvement:No  ? ?Subjective:  ? ?Angela Harrell participated from home, via video and consented to treatment. Therapist participated from home office. We met online due to [place] pandemic. Angela Harrell reviewed the events of the past week. Angela Harrell noted her recent psychiatric appointment with Angela Lair, NP. She noted being diagnosed with PMDD and was prescribed Sertaline 19m qd. Angela Harrell a follow-up with Angela Harrell 4/4 for a medication checks. She noted feeling confident of this process, as a whole, and is hopeful that her symptoms improve as a result. She noted her anxiety regarding the possibility of her neighbor returning home after experiencing significant federal legal charges. She noted her anxiety being higher, as a result.  We explored this during the session and worked on delineating her worries and concerns.  Additional stressors include her daughter's return from college for spring break.  Angela Harrell noted feeling overwhelmed by the numerous requests for her children and family members.  Therapist normalized  and validated Angela Harrell's feelings and experience.  Therapist encouraged Angela Harrell continue taking her medication as prescribed and to communicate any concerns with her prescriber during her follow-up.Therapist validated Angela Harrell's feelings and experience and provided supportive therapy.  ? ?Interventions: Interpersonal ? ? ?Diagnosis:  ?PMDD (premenstrual dysphoric disorder) ? ?Generalized anxiety disorder ? ? ?Treatment Plan: ? ?Client Abilities/Strengths ?Angela Harrell intelligent, forthcoming, and motivated for change. She has responded well to treatment, overall.  ? ?Support System: ?Husband, friends, mother.  ? ?Client Treatment Preferences ?Outpatient Therapy.  ? ?Client Statement of Needs ?EMalillanywould like to engage in self-care, engage in enjoyable activities, process past events and current interpersonal dynamics, manage overall symptoms, engage in self-care, and bolster coping skills.  ? ?Treatment Level ?Weekly ? ?Symptoms ?Anxiety: Feeling nervous, difficulty managing anxiety, difficulty relaxing, restlessness, irritability, rumination   (Status: maintained) ?Depression: Loss of interest, feeling down, middle insomnia, lethargy, feeling bad about self, difficulty managing attention/concentration.   (Status: maintained) ? ?Goals:  ?[name] experiences symptoms of depression and anxiety. ? ? ?Target Date: 07/25/22 Frequency: Weekly  ?Progress: 0 Modality: individual  ? ? ?Therapist will provide referrals for additional resources as appropriate.  ?Therapist will provide psycho-education regarding Angela Harrell's diagnosis and corresponding treatment approaches and interventions. ?Licensed Clinical Social Worker, OBuena Irish [name] will support the patient's ability to achieve the goals identified. will employ CBT, BA, Problem-solving, Solution Focused, Mindfulness,  coping skills, & other evidenced-based practices will be used to promote progress towards healthy functioning to help manage decrease symptoms associated with her  diagnosis.  ? Reduce overall level, frequency, and intensity of the feelings of depression and anxiety evidenced  by decreased overall symptoms from 6 to 7 days/week to 0 to 1 days/week per client report for at least 3 consecutive months. ?Verbally express understanding of the relationship between feelings of depression, anxiety and their impact on thinking patterns and behaviors. ?Verbalize an understanding of the role that distorted thinking plays in creating fears, excessive worry, and ruminations. ?Meet with psychiatric provider.  ? ? ? ?(Derrick participated in the creation of the treatment plan) ? ?[name], [name] ?

## 2021-09-19 ENCOUNTER — Ambulatory Visit (INDEPENDENT_AMBULATORY_CARE_PROVIDER_SITE_OTHER): Payer: BC Managed Care – PPO | Admitting: Psychology

## 2021-09-19 DIAGNOSIS — F418 Other specified anxiety disorders: Secondary | ICD-10-CM

## 2021-09-19 DIAGNOSIS — F3281 Premenstrual dysphoric disorder: Secondary | ICD-10-CM | POA: Diagnosis not present

## 2021-09-19 DIAGNOSIS — F411 Generalized anxiety disorder: Secondary | ICD-10-CM

## 2021-09-19 NOTE — Progress Notes (Signed)
[facility] Counselor/Therapist Progress Note ? ?Patient ID: Angela Harrell, MRN: [id number]  ? ?Date: 09/19/21 ? ?Time Spent: 10:05  am - 11:01 am : 56 Minutes ? ?Treatment Type: Individual Therapy. ? ?Reported Symptoms: depression and anxiety. ? ?Mental Status Exam: ?Appearance:  Neat and Well Groomed     ?Behavior: Appropriate  ?Motor: Normal  ?Speech/Language:  Clear and Coherent  ?Affect: Appropriate  ?Mood: normal  ?Thought process: normal  ?Thought content:   WNL  ?Sensory/Perceptual disturbances:   WNL  ?Orientation: oriented to person, place, time/date, and situation  ?Attention: Good  ?Concentration: Good  ?Memory: WNL  ?Fund of knowledge:  Good  ?Insight:   Good  ?Judgment:  Good  ?Impulse Control: Good  ? ?Risk Assessment: ?Danger to Self:  No ?Self-injurious Behavior: No ?Danger to Others: No ?Duty to Warn:no ?Physical Aggression / Violence:No  ?Access to Firearms a concern: No  ?Gang Involvement:No  ? ?Subjective:  ? ?Angela Harrell participated from home, via video and consented to treatment. Therapist participated from home office. We met online due to [place] pandemic. Angela Harrell reviewed the events of the past week. She noted feeling better overall and tolerating her medication well. She noted some improvement in her overall sleep. She noted recent stress of watching over her grandmother as her parents travel. She noted interpersonal stressors with a  neighbor, and their attempts to rectify the situation. She noted experiencing stress, as a result, and the effect of these stressors on her overall mood. She noted this experience is "not relaxing". We explored this, during the session and discussed possible ways to address this via assertive communication, which therapist modeled during the session. Therapist normalized and validated Angela Harrell's feelings and experience.  Therapist validated Angela Harrell's feelings and experience and provided supportive therapy.  ? ?Interventions: Interpersonal ? ? ?Diagnosis:   ?PMDD (premenstrual dysphoric disorder) ? ?Generalized anxiety disorder ? ?Other specified anxiety disorders ? ?Treatment Plan: ? ?Client Abilities/Strengths ?Angela Harrell is intelligent, forthcoming, and motivated for change. She has responded well to treatment, overall.  ? ?Support System: ?Husband, friends, mother.  ? ?Client Treatment Preferences ?Outpatient Therapy.  ? ?Client Statement of Needs ?Angela Harrell would like to engage in self-care, engage in enjoyable activities, process past events and current interpersonal dynamics, manage overall symptoms, engage in self-care, and bolster coping skills.  ? ?Treatment Level ?Weekly ? ?Symptoms ?Anxiety: Feeling nervous, difficulty managing anxiety, difficulty relaxing, restlessness, irritability, rumination   (Status: maintained) ?Depression: Loss of interest, feeling down, middle insomnia, lethargy, feeling bad about self, difficulty managing attention/concentration.   (Status: maintained) ? ?Goals:  ?Angela Harrell experiences symptoms of depression and anxiety. ? ? ?Target Date: 07/25/22 Frequency: Weekly  ?Progress: 0 Modality: individual  ? ? ?Therapist will provide referrals for additional resources as appropriate.  ?Therapist will provide psycho-education regarding Angela Harrell's diagnosis and corresponding treatment approaches and interventions. ?Licensed Clinical Social Worker, [name], [name] will support the patient's ability to achieve the goals identified. will employ CBT, BA, Problem-solving, Solution Focused, Mindfulness,  coping skills, & other evidenced-based practices will be used to promote progress towards healthy functioning to help manage decrease symptoms associated with her diagnosis.  ? Reduce overall level, frequency, and intensity of the feelings of depression and anxiety evidenced by decreased overall symptoms from 6 to 7 days/week to 0 to 1 days/week per client report for at least 3 consecutive months. ?Verbally express understanding of the relationship between  feelings of depression, anxiety and their impact on thinking patterns and behaviors. ?Verbalize an understanding of the role  that distorted thinking plays in creating fears, excessive worry, and ruminations. ?Meet with psychiatric provider.  ? ? ? ?(Angela Harrell participated in the creation of the treatment plan) ? ?[name], [name] ? ? ? ? ? ? ? ? ? ? ? ? ? ? ?[name], [name] ?

## 2021-10-02 ENCOUNTER — Ambulatory Visit (INDEPENDENT_AMBULATORY_CARE_PROVIDER_SITE_OTHER): Payer: BC Managed Care – PPO | Admitting: Adult Health

## 2021-10-02 ENCOUNTER — Encounter: Payer: Self-pay | Admitting: Adult Health

## 2021-10-02 DIAGNOSIS — F411 Generalized anxiety disorder: Secondary | ICD-10-CM | POA: Diagnosis not present

## 2021-10-02 DIAGNOSIS — F3281 Premenstrual dysphoric disorder: Secondary | ICD-10-CM | POA: Diagnosis not present

## 2021-10-02 NOTE — Progress Notes (Signed)
Angela Harrell ?[id number] ?1975/12/09 ?46 y.o. ? ?Subjective:  ? ?Patient ID:  Angela Harrell is a 46 y.o. (DOB [birth year]) female. ? ?Chief Complaint: No chief complaint on file. ? ? ?HPI ?Angela Harrell presents to the office today for follow-up of PMDD and GAD. ? ?Describes mood today as "better". Pleasant. Tearful throughout interview. Mood symptoms - reports decreased depression, anxiety, and irritability. Reports decreased worry and rumination - some over thinking. Stating "I feel a bit calmer". Reports more emotional stability with addition of Zoloft. Reports one menstrual cycle since starting Zoloft - may be a little bit better - too soon to know. Seeing therapist. Improved interest and motivation. Taking current medications as prescribed.  ?Energy levels lower, but getting there. Active, does not have a regular exercise routine.   ?Enjoys some usual interests and activities. Married 23 years, together 25 years. Has 3 children 15, 17, and 19. Parents local - grandmother. Spending time with family. ?Appetite adequate. Weight stable - 202 pounds. ?Sleeps well most nights. Averages 6 to 7 hours. ?Focus and concentration stable. Completing tasks. Managing aspects of household. 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 ?Denies SI or HI.  ?Denies AH or VH. ? ?History of seasonal depression. ? ?Previous medication trials: Celexa '10mg'$  daily ? ?PHQ2-9   ? ?[place] Office Visit from 05/22/2021 in [place] at [facility] from 07/17/2018 in [place] at [facility] from 03/18/2018 in [place] at [facility] from 11/09/2014 in [place]  ?PHQ-2 Total Score 0 '2 3 1  '$ ?PHQ-9 Total Score '3 6 8 '$ --  ? ?  ?  ? ?Review of Systems:  ?Review of Systems  ?Musculoskeletal:  Negative for gait problem.  ?Neurological:  Negative for tremors.  ?Psychiatric/Behavioral:    ?     Please refer to HPI  ? ?Medications: I have reviewed the patient's current  medications. ? ?Current Outpatient Medications  ?Medication Sig Dispense Refill  ? albuterol (PROVENTIL HFA) 108 (90 Base) MCG/ACT inhaler Inhale 2 puffs into the lungs every 6 (six) hours as needed for wheezing or shortness of breath. 1 Inhaler 3  ? cetirizine (ZYRTEC) 10 MG tablet Take 10 mg by mouth daily.    ? Cholecalciferol (VITAMIN D3 PO) Take 2,000 mg by mouth daily.     ? citalopram (CELEXA) 10 MG tablet Take 1 tablet (10 mg total) by mouth daily. PT NEEDS APPT. 90 tablet 3  ? LUTEIN PO Take 1 capsule by mouth daily.    ? Multiple Vitamins-Minerals (MULTIVITAMIN WITH MINERALS) tablet Take 1 tablet by mouth daily.    ? Omega-3 Fatty Acids (FISH OIL) 1000 MG CAPS Take by mouth.    ? omeprazole (PRILOSEC) 20 MG capsule Take 1 capsule (20 mg total) by mouth daily before breakfast. (Patient not taking: Reported on 05/22/2021) 30 capsule 1  ? Probiotic Product (PROBIOTIC PO) Take 1 tablet by mouth every other day.    ? PSYLLIUM HUSK PO Take 2 capsules by mouth daily.    ? rosuvastatin (CRESTOR) 10 MG tablet Take 1 tablet (10 mg total) by mouth daily. 90 tablet 3  ? sertraline (ZOLOFT) 50 MG tablet Take 1/2 tablet daily for 7 days, then increase to one tablet daily. 30 tablet 2  ? SPIRULINA PO Take 1 tablet by mouth daily. For food allergy prevention    ? Triamcinolone Acetonide (TRIAMCINOLONE 0.1 % CREAM : EUCERIN) CREA Apply 1 application topically 2 (two) times daily as needed for rash or  itching. 500 each 1  ? Turmeric Curcumin 500 MG CAPS Take by mouth.    ? ?No current facility-administered medications for this visit.  ? ? ?Medication Side Effects: None ? ?Allergies:  ?Allergies  ?Allergen Reactions  ? Beef-Derived Products Diarrhea  ?  Nausea and vomiting  ? Prochlorperazine Edisylate Shortness Of Breath  ?  Compazine ?Tongue swelling  ? Prochlorperazine Edisylate Swelling  ?  Swelling of throat  ? Tree Extract Anaphylaxis  ?  Throat itchy, roof of mouth gets raw  ? Banana Itching and Nausea And Vomiting  ?   Throat gets itchy  ? Beef (Bovine) Protein   ?  Diarrhea and stomach cramps   ? Oxycodone-Acetaminophen   ?  Vomiting  ? Permethrin Rash  ?  Rash and fever  ? Statins Itching  ? Morphine And Related Itching  ? Other   ?  Tree Nuts (can have peanuts and cashews) ?Pyrethrine (ingredient in lice products)- fever and rash  ? Oxycodone-Acetaminophen Nausea And Vomiting  ? Latex Hives and Rash  ?  Rash  ? ? ?Past Medical History:  ?Diagnosis Date  ? ALLERGIC RHINITIS 05/31/2008  ? Anemia   ? ANXIETY 05/31/2008  ? ASTHMA 05/31/2008  ? Asthma   ? CHOLELITHIASIS 05/31/2008  ? High blood pressure   ? readings  ? History of kidney stones   ? History of right breast biopsy 2017  ? HYPERLIPIDEMIA 05/31/2008  ? Kidney stone   ? NEPHROLITHIASIS, HX OF 05/31/2008  ? ? ?Past Medical History, Surgical history, Social history, and Family history were reviewed and updated as appropriate.  ? ?Please see review of systems for further details on the patient's review from today.  ? ?Objective:  ? ?Physical Exam:  ?There were no vitals taken for this visit. ? ?Physical Exam ?Constitutional:   ?   General: She is not in acute distress. ?Musculoskeletal:     ?   General: No deformity.  ?Neurological:  ?   Mental Status: She is alert and oriented to person, place, and time.  ?   Coordination: Coordination normal.  ?Psychiatric:     ?   Attention and Perception: Attention and perception normal. She does not perceive auditory or visual hallucinations.     ?   Mood and Affect: Mood normal. Mood is not anxious or depressed. Affect is not labile, blunt, angry or inappropriate.     ?   Speech: Speech normal.     ?   Behavior: Behavior normal.     ?   Thought Content: Thought content normal. Thought content is not paranoid or delusional. Thought content does not include homicidal or suicidal ideation. Thought content does not include homicidal or suicidal plan.     ?   Cognition and Memory: Cognition and memory normal.     ?   Judgment: Judgment normal.   ?   Comments: Insight intact  ? ? ?Lab Review:  ?   ?Component Value Date/Time  ? NA 135 05/22/2021 1142  ? K 4.3 05/22/2021 1142  ? CL 99 05/22/2021 1142  ? CO2 29 05/22/2021 1142  ? GLUCOSE 95 05/22/2021 1142  ? BUN 13 05/22/2021 1142  ? CREATININE 0.84 05/22/2021 1142  ? CALCIUM 9.7 05/22/2021 1142  ? PROT 7.6 05/22/2021 1142  ? ALBUMIN 4.5 05/22/2021 1142  ? AST 34 05/22/2021 1142  ? ALT 45 (H) 05/22/2021 1142  ? ALKPHOS 65 05/22/2021 1142  ? BILITOT 0.5 05/22/2021 1142  ? GFRNONAA 53 (  L) 03/30/2017 0550  ? GFRAA >60 03/30/2017 0550  ? ? ?   ?Component Value Date/Time  ? WBC 8.2 03/18/2018 1036  ? RBC 4.61 03/18/2018 1036  ? HGB 13.3 03/18/2018 1036  ? HCT 39.7 03/18/2018 1036  ? PLT 302.0 03/18/2018 1036  ? MCV 86.2 03/18/2018 1036  ? MCH 28.4 03/30/2017 0550  ? MCHC 33.5 03/18/2018 1036  ? RDW 14.0 03/18/2018 1036  ? LYMPHSABS 2.9 11/09/2014 0930  ? MONOABS 0.5 11/09/2014 0930  ? EOSABS 0.4 11/09/2014 0930  ? BASOSABS 0.0 11/09/2014 0930  ? ? ?No results found for: POCLITH, LITHIUM  ? ?No results found for: PHENYTOIN, PHENOBARB, VALPROATE, CBMZ  ? ?.res ?Assessment: Plan:   ? ? ?Plan: ? ?PDMP reviewed ? ?Zoloft '50mg'$  daily ? ?PMDD screening tool completed initial session. ? ?Time spent with patient was 20 minutes. Greater than 50% of face to face time with patient was spent on counseling and coordination of care.  ? ?RTC 2 months ? ?Patient advised to contact office with any questions, adverse effects, or acute worsening in signs and symptoms. ?  ? ?Diagnoses and all orders for this visit: ? ?PMDD (premenstrual dysphoric disorder) ? ?Generalized anxiety disorder ? ?  ? ?Please see After Visit Summary for patient specific instructions. ? ?Future Appointments  ?Date Time Provider [place]  ?10/03/2021 10:00 AM [name], [name] [facility] None  ?10/17/2021 10:00 AM [name], [name] [facility] None  ?10/31/2021 10:00 AM [name], [name] [facility] None  ?11/14/2021 10:00 AM [name], [name]  [facility] None  ?11/28/2021 10:00 AM [name], [name] [facility] None  ?12/04/2021  8:40 AM Dymond Spreen, Berdie Ogren, NP [facility] None  ?12/12/2021 10:00 AM [name], [name] [facility] None  ?12/26/2021 10:00 AM [facility]

## 2021-10-03 ENCOUNTER — Ambulatory Visit (INDEPENDENT_AMBULATORY_CARE_PROVIDER_SITE_OTHER): Payer: BC Managed Care – PPO | Admitting: Psychology

## 2021-10-03 DIAGNOSIS — F411 Generalized anxiety disorder: Secondary | ICD-10-CM

## 2021-10-03 DIAGNOSIS — F3281 Premenstrual dysphoric disorder: Secondary | ICD-10-CM
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
## 2021-10-17 ENCOUNTER — Ambulatory Visit (INDEPENDENT_AMBULATORY_CARE_PROVIDER_SITE_OTHER): Payer: BC Managed Care – PPO | Admitting: Psychology

## 2021-10-17 DIAGNOSIS — F3281 Premenstrual dysphoric disorder: Secondary | ICD-10-CM

## 2021-10-17 DIAGNOSIS — F411 Generalized anxiety disorder: Secondary | ICD-10-CM

## 2021-10-17 NOTE — Progress Notes (Signed)
[place] Counselor/Therapist Progress Note ? ?Patient ID: Angela Harrell, MRN: [id number]  ? ?Date: 10/17/21 ? ?Time Spent: 10:06  am - 11:03 am : 57 Minutes ? ?Treatment Type: Individual Therapy. ? ?Reported Symptoms: depression and anxiety. ? ?Mental Status Exam: ?Appearance:  Neat and Well Groomed     ?Behavior: Appropriate  ?Motor: Normal  ?Speech/Language:  Clear and Coherent  ?Affect: Appropriate  ?Mood: normal  ?Thought process: normal  ?Thought content:   WNL  ?Sensory/Perceptual disturbances:   WNL  ?Orientation: oriented to person, place, time/date, and situation  ?Attention: Good  ?Concentration: Good  ?Memory: WNL  ?Fund of knowledge:  Good  ?Insight:   Good  ?Judgment:  Good  ?Impulse Control: Good  ? ?Risk Assessment: ?Danger to Self:  No ?Self-injurious Behavior: No ?Danger to Others: No ?Duty to Warn:no ?Physical Aggression / Violence:No  ?Access to Firearms a concern: No  ?Gang Involvement:No  ? ?Subjective:  ? ?Angela Harrell participated from home, via video and consented to treatment. Therapist participated from home office. We met online due to [place] pandemic. Angela Harrell reviewed the events of the past week. Angela Harrell  noted feeling overwhelming regarding the responsibilities including paperwork. She discussed her recent visit to [place] and noted the reminding of her friend Lynn's passing during 9/11 attack. We explored this during the session and the effect of this on her mood during her trip. She continues to take her medication consistently and noted handling stressful situations more positively, overall. Therapist validated Angela Harrell's feelings and experience and provided supportive therapy.  ? ?Interventions: Interpersonal ? ?Diagnosis:  ?PMDD (premenstrual dysphoric disorder) ? ?Generalized anxiety disorder ? ?Treatment Plan: ? ?Client Abilities/Strengths ?Angela Harrell is intelligent, forthcoming, and motivated for change. She has responded well to treatment, overall.  ? ?Support System: ?Husband,  friends, mother.  ? ?Client Treatment Preferences ?Outpatient Therapy.  ? ?Client Statement of Needs ?Angela Harrell would like to engage in self-care, engage in enjoyable activities, process past events and current interpersonal dynamics, manage overall symptoms, engage in self-care, and bolster coping skills.  ? ?Treatment Level ?Weekly ? ?Symptoms ?Anxiety: Feeling nervous, difficulty managing anxiety, difficulty relaxing, restlessness, irritability, rumination   (Status: maintained) ?Depression: Loss of interest, feeling down, middle insomnia, lethargy, feeling bad about self, difficulty managing attention/concentration.   (Status: maintained) ? ?Goals:  ?Angela Harrell experiences symptoms of depression and anxiety. ? ? ?Target Date: 07/25/22 Frequency: Weekly  ?Progress: 0 Modality: individual  ? ? ?Therapist will provide referrals for additional resources as appropriate.  ?Therapist will provide psycho-education regarding Angela Harrell's diagnosis and corresponding treatment approaches and interventions. ?Licensed Clinical Social Worker, [name], [name] will support the patient's ability to achieve the goals identified. will employ CBT, BA, Problem-solving, Solution Focused, Mindfulness,  coping skills, & other evidenced-based practices will be used to promote progress towards healthy functioning to help manage decrease symptoms associated with her diagnosis.  ? Reduce overall level, frequency, and intensity of the feelings of depression and anxiety evidenced by decreased overall symptoms from 6 to 7 days/week to 0 to 1 days/week per client report for at least 3 consecutive months. ?Verbally express understanding of the relationship between feelings of depression, anxiety and their impact on thinking patterns and behaviors. ?Verbalize an understanding of the role that distorted thinking plays in creating fears, excessive worry, and ruminations. ?Meet with psychiatric provider.  ? ? ? ?(Angela Harrell participated in the creation of the  treatment plan) ? ?[name], [name] ? ?

## 2021-10-31 ENCOUNTER — Ambulatory Visit (INDEPENDENT_AMBULATORY_CARE_PROVIDER_SITE_OTHER): Payer: BC Managed Care – PPO | Admitting: Psychology

## 2021-10-31 DIAGNOSIS — F411 Generalized anxiety disorder: Secondary | ICD-10-CM

## 2021-10-31 DIAGNOSIS — F3281 Premenstrual dysphoric disorder: Secondary | ICD-10-CM

## 2021-10-31 NOTE — Progress Notes (Signed)
[facility] Counselor/Therapist Progress Note ? ?Patient ID: Angela Harrell, MRN: [id number]  ? ?Date: 10/31/21 ? ?Time Spent: 10:05 am -11:04 am : 59 Minutes ? ?Treatment Type: Individual Therapy. ? ?Reported Symptoms: depression and anxiety. ? ?Mental Status Exam: ?Appearance:  Neat and Well Groomed     ?Behavior: Appropriate  ?Motor: Normal  ?Speech/Language:  Clear and Coherent  ?Affect: Appropriate  ?Mood: normal  ?Thought process: normal  ?Thought content:   WNL  ?Sensory/Perceptual disturbances:   WNL  ?Orientation: oriented to person, place, time/date, and situation  ?Attention: Good  ?Concentration: Good  ?Memory: WNL  ?Fund of knowledge:  Good  ?Insight:   Good  ?Judgment:  Good  ?Impulse Control: Good  ? ?Risk Assessment: ?Danger to Self:  No ?Self-injurious Behavior: No ?Danger to Others: No ?Duty to Warn:no ?Physical Aggression / Violence:No  ?Access to Firearms a concern: No  ?Gang Involvement:No  ? ?Subjective:  ? ?[name] participated from home, via video and consented to treatment. Therapist participated from home office. We met online due to [place] pandemic. Abiageal reviewed the events of the past week. She noted feeling overwhelmed by the transitions during this time including her husband's travel, her child going back to college, and her son's recent driver's ED course. We worked on processing these events and worked on reviewing her Radiographer, therapeutic. She noted her father's worsening alzheimer's and the effect on the family, as a whole. She noted her father's worsening cognitive health affecting her ability to spend time with her mother. She noted her farther's attempts to control outings and situations and her recent efforts to set boundaries and maintain them. She noted difficulty being able to do this prior to her 39s. She noted feelings of no control, during childhood, when interacting with her father.  She noted parallels with this and a Presenter, broadcasting at school. Therapist  normalized and validated Ree's feelings and experience and provided supportive therapy.  ? ?Interventions: Interpersonal ? ?Diagnosis:  ?PMDD (premenstrual dysphoric disorder) ? ?Generalized anxiety disorder ? ?Treatment Plan: ? ?Client Abilities/Strengths ?Annika is intelligent, forthcoming, and motivated for change. She has responded well to treatment, overall.  ? ?Support System: ?Husband, friends, mother.  ? ?Client Treatment Preferences ?Outpatient Therapy.  ? ?Client Statement of Needs ?Brendaliz would like to engage in self-care, engage in enjoyable activities, process past events and current interpersonal dynamics, manage overall symptoms, engage in self-care, and bolster coping skills.  ? ?Treatment Level ?Weekly ? ?Symptoms ?Anxiety: Feeling nervous, difficulty managing anxiety, difficulty relaxing, restlessness, irritability, rumination   (Status: maintained) ?Depression: Loss of interest, feeling down, middle insomnia, lethargy, feeling bad about self, difficulty managing attention/concentration.   (Status: maintained) ? ?Goals:  ?Deyna experiences symptoms of depression and anxiety. ? ? ?Target Date: 07/25/22 Frequency: Weekly  ?Progress: 10 Modality: individual  ? ? ?Therapist will provide referrals for additional resources as appropriate.  ?Therapist will provide psycho-education regarding Jenniferann's diagnosis and corresponding treatment approaches and interventions. ?Licensed Clinical Social Worker, [name], [name] will support the patient's ability to achieve the goals identified. will employ CBT, BA, Problem-solving, Solution Focused, Mindfulness,  coping skills, & other evidenced-based practices will be used to promote progress towards healthy functioning to help manage decrease symptoms associated with her diagnosis.  ? Reduce overall level, frequency, and intensity of the feelings of depression and anxiety evidenced by decreased overall symptoms from 6 to 7 days/week to 0 to 1 days/week per client  report for at least 3 consecutive months. ?Verbally express understanding of  the relationship between feelings of depression, anxiety and their impact on thinking patterns and behaviors. ?Verbalize an understanding of the role that distorted thinking plays in creating fears, excessive worry, and ruminations. ?Meet with psychiatric provider.  ? ? ? ?(Ofilia participated in the creation of the treatment plan) ? ?[name], [name] ? ?

## 2021-11-14 ENCOUNTER — Ambulatory Visit (INDEPENDENT_AMBULATORY_CARE_PROVIDER_SITE_OTHER): Payer: BC Managed Care – PPO | Admitting: Psychology

## 2021-11-14 DIAGNOSIS — F411 Generalized anxiety disorder: Secondary | ICD-10-CM

## 2021-11-14 DIAGNOSIS — F3281 Premenstrual dysphoric disorder: Secondary | ICD-10-CM

## 2021-11-14 NOTE — Progress Notes (Signed)
[facility] Counselor/Therapist Progress Note ? ?Patient ID: Angela Harrell, MRN: [id number]  ? ?Date: 11/14/21 ? ?Time Spent: 10:04 am - 11:01 am : 57 Minutes ? ?Treatment Type: Individual Therapy. ? ?Reported Symptoms: depression and anxiety. ? ?Mental Status Exam: ?Appearance:  Neat and Well Groomed     ?Behavior: Appropriate  ?Motor: Normal  ?Speech/Language:  Clear and Coherent  ?Affect: Appropriate  ?Mood: normal  ?Thought process: normal  ?Thought content:   WNL  ?Sensory/Perceptual disturbances:   WNL  ?Orientation: oriented to person, place, time/date, and situation  ?Attention: Good  ?Concentration: Good  ?Memory: WNL  ?Fund of knowledge:  Good  ?Insight:   Good  ?Judgment:  Good  ?Impulse Control: Good  ? ?Risk Assessment: ?Danger to Self:  No ?Self-injurious Behavior: No ?Danger to Others: No ?Duty to Warn:no ?Physical Aggression / Violence:No  ?Access to Firearms a concern: No  ?Gang Involvement:No  ? ?Subjective:  ? ?Angela Harrell participated from home, via video and consented to treatment. Therapist participated from home office. We met online due to Angela Harrell pandemic. Angela Harrell reviewed the events of the past week. She noted her daughter, Angela Harrell, needing a screening for possible liver issues. She noted her daughter having a history of ADHD and is prescribed Atomoxetine. She discussed her worry that her son could have ADHD and that she, herself, could have ADHD as well. There is a prevalent family history of ADHD. We completed Part A of the ASRS V1.1, an ADHD screening, during the session.   ? ? ?Therapist normalized and validated Angela Harrell's feelings and experience and provided supportive therapy.  ? ?ASRS V1.1 ? ?Part-A ?1. How often do you have trouble wrapping up the final details of a project, ?    once the challenging parts have been done? Very Often ?2. How often do you have difficulty getting things in order when you have to do ?    a task that requires organization? Sometimes ?3. How often do  you have problems remembering appointments or obligations? Often ?4. When you have a task that requires a lot of thought, how often do you avoid ?    or delay getting started? Often ?5. How often do you fidget or squirm with your hands or feet when you have ?    to sit down for a long time? Rarely ?6. How often do you feel overly active and compelled to do things, like you ?    were driven by a motor? Sometimes ? ?Interventions:  psycho-education and screening. ? ?Diagnosis:  ?PMDD (premenstrual dysphoric disorder) ? ?Generalized anxiety disorder ? ?Treatment Plan: ? ?Client Abilities/Strengths ?Angela Harrell is intelligent, forthcoming, and motivated for change. She has responded well to treatment, overall.  ? ?Support System: ?Husband, friends, mother.  ? ?Client Treatment Preferences ?Outpatient Therapy.  ? ?Client Statement of Needs ?Angela Harrell would like to engage in self-care, engage in enjoyable activities, process past events and current interpersonal dynamics, manage overall symptoms, engage in self-care, and bolster coping skills.  ? ?Treatment Level ?Weekly ? ?Symptoms ?Anxiety: Feeling nervous, difficulty managing anxiety, difficulty relaxing, restlessness, irritability, rumination   (Status: maintained) ?Depression: Loss of interest, feeling down, middle insomnia, lethargy, feeling bad about self, difficulty managing attention/concentration.   (Status: maintained) ? ?Goals:  ?Angela Harrell experiences symptoms of depression and anxiety. ? ? ?Target Date: 07/25/22 Frequency: Weekly  ?Progress: 10 Modality: individual  ? ? ?Therapist will provide referrals for additional resources as appropriate.  ?Therapist will provide psycho-education regarding Angela Harrell's diagnosis and corresponding treatment approaches  and interventions. ?Licensed Clinical Social Worker, [name], [name] will support the patient's ability to achieve the goals identified. will employ CBT, BA, Problem-solving, Solution Focused, Mindfulness,  coping skills, &  other evidenced-based practices will be used to promote progress towards healthy functioning to help manage decrease symptoms associated with her diagnosis.  ? Reduce overall level, frequency, and intensity of the feelings of depression and anxiety evidenced by decreased overall symptoms from 6 to 7 days/week to 0 to 1 days/week per client report for at least 3 consecutive months. ?Verbally express understanding of the relationship between feelings of depression, anxiety and their impact on thinking patterns and behaviors. ?Verbalize an understanding of the role that distorted thinking plays in creating fears, excessive worry, and ruminations. ?Meet with psychiatric provider.  ? ? ? ?(Angela Harrell participated in the creation of the treatment plan) ? ?[name], [name] ? ?

## 2021-11-27 ENCOUNTER — Other Ambulatory Visit: Payer: Self-pay | Admitting: Adult Health

## 2021-11-27 DIAGNOSIS — F411 Generalized anxiety disorder: Secondary | ICD-10-CM

## 2021-11-27 DIAGNOSIS — F3281 Premenstrual dysphoric disorder: Secondary | ICD-10-CM

## 2021-11-28 ENCOUNTER — Ambulatory Visit (INDEPENDENT_AMBULATORY_CARE_PROVIDER_SITE_OTHER): Payer: BC Managed Care – PPO | Admitting: Psychology

## 2021-11-28 DIAGNOSIS — F3281 Premenstrual dysphoric disorder: Secondary | ICD-10-CM

## 2021-11-28 DIAGNOSIS — F411 Generalized anxiety disorder: Secondary | ICD-10-CM

## 2021-11-28 NOTE — Progress Notes (Signed)
[facility] Counselor/Therapist Progress Note  Patient ID: Angela Harrell, MRN: [id number]   Date: 11/28/21  Time Spent: 10:05 am - 11:01 am : 56 Minutes  Treatment Type: Individual Therapy.  Reported Symptoms: depression and anxiety.  Mental Status Exam: Appearance:  Neat and Well Groomed     Behavior: Appropriate  Motor: Normal  Speech/Language:  Clear and Coherent  Affect: Appropriate  Mood: normal  Thought process: normal  Thought content:   WNL  Sensory/Perceptual disturbances:   WNL  Orientation: oriented to person, place, time/date, and situation  Attention: Good  Concentration: Good  Memory: WNL  Fund of knowledge:  Good  Insight:   Good  Judgment:  Good  Impulse Control: Good   Risk Assessment: Danger to Self:  No Self-injurious Behavior: No Danger to Others: No Duty to Warn:no Physical Aggression / Violence:No  Access to Firearms a concern: No  Gang Involvement:No   Subjective:   Angela Harrell participated from home, via video and consented to treatment. Therapist participated from home office. We met online due to [place] pandemic. Angela Harrell reviewed the events of the past week. She noted stress related to her part-time job and noted feeling frustration regarding the customer's response/reaction to the situation. She noted her propensity to be a "people pleaser" in childhood and noted efforts to disengage from this approach. She noted strain with her friend, Angela Harrell, who disconnected from her for ~9 months and recently began contact with Angela Harrell without an explanation. She noted feelings of confusion and frustration regarding her friend's behavior and was tearful during the recall of events. She noted being best friend for Angela Harrell, for 10 years, prior to this unexpected estrangement. She noted having disturbed dreams, for a week, post conversation with Angela Harrell. We continued to explore these stressors, during the session, and discussed ways to communicate concerns to her  friend. Therapist encouraged Angela Harrell to write a letter, as a tool to process her feelings, to be processing going forward. Therapist normalized and validated Angela Harrell's feelings and experience and provided supportive therapy.    Interventions: Interpersonal  Diagnosis:  PMDD (premenstrual dysphoric disorder)  Generalized anxiety disorder  Treatment Plan:  Client Abilities/Strengths Angela Harrell is intelligent, forthcoming, and motivated for change. She has responded well to treatment, overall.   Support System: Husband, friends, mother.   Client Treatment Preferences Outpatient Therapy.   Client Statement of Needs Angela Harrell would like to engage in self-care, engage in enjoyable activities, process past events and current interpersonal dynamics, manage overall symptoms, engage in self-care, and bolster coping skills.   Treatment Level Weekly  Symptoms Anxiety: Feeling nervous, difficulty managing anxiety, difficulty relaxing, restlessness, irritability, rumination   (Status: maintained) Depression: Loss of interest, feeling down, middle insomnia, lethargy, feeling bad about self, difficulty managing attention/concentration.   (Status: maintained)  Goals:  Angela Harrell experiences symptoms of depression and anxiety.   Target Date: 07/25/22 Frequency: Weekly  Progress: 10 Modality: individual    Therapist will provide referrals for additional resources as appropriate.  Therapist will provide psycho-education regarding Angela Harrell's diagnosis and corresponding treatment approaches and interventions. Licensed Clinical Social Worker, [place], [name] will support the patient's ability to achieve the goals identified. will employ CBT, BA, Problem-solving, Solution Focused, Mindfulness,  coping skills, & other evidenced-based practices will be used to promote progress towards healthy functioning to help manage decrease symptoms associated with her diagnosis.   Reduce overall level, frequency, and intensity of  the feelings of depression and anxiety evidenced by decreased overall symptoms from 6 to 7 days/week  to 0 to 1 days/week per client report for at least 3 consecutive months. Verbally express understanding of the relationship between feelings of depression, anxiety and their impact on thinking patterns and behaviors. Verbalize an understanding of the role that distorted thinking plays in creating fears, excessive worry, and ruminations. Meet with psychiatric provider.     Angela Harrell participated in the creation of the treatment plan)  [name], [name]

## 2021-12-04 ENCOUNTER — Encounter: Payer: Self-pay | Admitting: Adult Health

## 2021-12-04 ENCOUNTER — Ambulatory Visit (INDEPENDENT_AMBULATORY_CARE_PROVIDER_SITE_OTHER): Payer: BC Managed Care – PPO | Admitting: Adult Health

## 2021-12-04 DIAGNOSIS — F3281 Premenstrual dysphoric disorder: Secondary | ICD-10-CM

## 2021-12-04 DIAGNOSIS — F411 Generalized anxiety disorder: Secondary | ICD-10-CM | POA: Diagnosis not present

## 2021-12-04 MED ORDER — SERTRALINE HCL 50 MG PO TABS: 50.0000 mg | ORAL_TABLET | Freq: Every day | ORAL | 1 refills | Status: DC

## 2021-12-04 NOTE — Progress Notes (Signed)
Angela Harrell [id number] February 28, 1976 46 y.o.  Subjective:   Patient ID:  Angela Harrell is a 46 y.o. (DOB Dec 17, 1975) female.  Chief Complaint: No chief complaint on file.   HPI Angela Harrell presents to the office today for follow-up of PMDD and GAD.  Describes mood today as "ok". Pleasant. Decreased tearfulness - appropritate. Mood symptoms - reports decreased depression, anxiety, and irritability. Reports decreased worry and rumination - "still happens some". Has felt more "chill" overall. Stating "I'm not use to feeling that way". Reporting some GI issues, fatigue and jitters - possibly related to Zoloft.  Seeing therapist. Improved interest and motivation. Taking current medications as prescribed.  Energy levels up and down - "blah in the mornings". Active, does not have a regular exercise routine.   Enjoys some usual interests and activities. Married 23 years, together 25 years. Has 3 children 15, 17, and 19. Parents local - grandmother. Spending time with family. Appetite adequate. Weight stable - 190's pounds. Sleeps well most nights. Averages 5 to 6 hours. Focus and concentration stable. Completing tasks. Managing aspects of household. 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 Denies SI or HI.  Denies AH or VH.  History of seasonal depression.  Previous medication trials: Celexa '10mg'$  daily   PHQ2-9    [facility] Office Visit from 05/22/2021 in [facility] at [place] from 07/17/2018 in [facility] at [facility] from 03/18/2018 in [facility] at [facility] from 11/09/2014 in [facility]  PHQ-2 Total Score 0 '2 3 1  '$ PHQ-9 Total Score '3 6 8 '$ --        Review of Systems:  Review of Systems  Musculoskeletal:  Negative for gait problem.  Neurological:  Negative for tremors.  Psychiatric/Behavioral:         Please refer to HPI   Medications: I have reviewed the patient's current medications.  Current  Outpatient Medications  Medication Sig Dispense Refill   albuterol (PROVENTIL HFA) 108 (90 Base) MCG/ACT inhaler Inhale 2 puffs into the lungs every 6 (six) hours as needed for wheezing or shortness of breath. 1 Inhaler 3   cetirizine (ZYRTEC) 10 MG tablet Take 10 mg by mouth daily.     Cholecalciferol (VITAMIN D3 PO) Take 2,000 mg by mouth daily.      LUTEIN PO Take 1 capsule by mouth daily.     Multiple Vitamins-Minerals (MULTIVITAMIN WITH MINERALS) tablet Take 1 tablet by mouth daily.     Omega-3 Fatty Acids (FISH OIL) 1000 MG CAPS Take by mouth.     omeprazole (PRILOSEC) 20 MG capsule Take 1 capsule (20 mg total) by mouth daily before breakfast. (Patient not taking: Reported on 05/22/2021) 30 capsule 1   Probiotic Product (PROBIOTIC PO) Take 1 tablet by mouth every other day.     PSYLLIUM HUSK PO Take 2 capsules by mouth daily.     rosuvastatin (CRESTOR) 10 MG tablet Take 1 tablet (10 mg total) by mouth daily. 90 tablet 3   sertraline (ZOLOFT) 50 MG tablet Take 1 tablet (50 mg total) by mouth daily. 90 tablet 1   SPIRULINA PO Take 1 tablet by mouth daily. For food allergy prevention     Triamcinolone Acetonide (TRIAMCINOLONE 0.1 % CREAM : EUCERIN) CREA Apply 1 application topically 2 (two) times daily as needed for rash or itching. 500 each 1   Turmeric Curcumin 500 MG CAPS Take by mouth.     No current facility-administered medications for this visit.  Medication Side Effects: None  Allergies:  Allergies  Allergen Reactions   Beef-Derived Products Diarrhea    Nausea and vomiting   Prochlorperazine Edisylate Shortness Of Breath    Compazine Tongue swelling   Prochlorperazine Edisylate Swelling    Swelling of throat   Tree Extract Anaphylaxis    Throat itchy, roof of mouth gets raw   Banana Itching and Nausea And Vomiting    Throat gets itchy   Beef (Bovine) Protein     Diarrhea and stomach cramps    Oxycodone-Acetaminophen     Vomiting   Permethrin Rash    Rash and  fever   Statins Itching   Morphine And Related Itching   Other     Tree Nuts (can have peanuts and cashews) Pyrethrine (ingredient in lice products)- fever and rash   Oxycodone-Acetaminophen Nausea And Vomiting   Latex Hives and Rash    Rash    Past Medical History:  Diagnosis Date   ALLERGIC RHINITIS 05/31/2008   Anemia    ANXIETY 05/31/2008   ASTHMA 05/31/2008   Asthma    CHOLELITHIASIS 05/31/2008   High blood pressure    readings   History of kidney stones    History of right breast biopsy 2017   HYPERLIPIDEMIA 05/31/2008   Kidney stone    NEPHROLITHIASIS, HX OF 05/31/2008    Past Medical History, Surgical history, Social history, and Family history were reviewed and updated as appropriate.   Please see review of systems for further details on the patient's review from today.   Objective:   Physical Exam:  There were no vitals taken for this visit.  Physical Exam Constitutional:      General: She is not in acute distress. Musculoskeletal:        General: No deformity.  Neurological:     Mental Status: She is alert and oriented to person, place, and time.     Coordination: Coordination normal.  Psychiatric:        Attention and Perception: Attention and perception normal. She does not perceive auditory or visual hallucinations.        Mood and Affect: Mood normal. Mood is not anxious or depressed. Affect is not labile, blunt, angry or inappropriate.        Speech: Speech normal.        Behavior: Behavior normal.        Thought Content: Thought content normal. Thought content is not paranoid or delusional. Thought content does not include homicidal or suicidal ideation. Thought content does not include homicidal or suicidal plan.        Cognition and Memory: Cognition and memory normal.        Judgment: Judgment normal.     Comments: Insight intact    Lab Review:     Component Value Date/Time   NA 135 05/22/2021 1142   K 4.3 05/22/2021 1142   CL 99 05/22/2021  1142   CO2 29 05/22/2021 1142   GLUCOSE 95 05/22/2021 1142   BUN 13 05/22/2021 1142   CREATININE 0.84 05/22/2021 1142   CALCIUM 9.7 05/22/2021 1142   PROT 7.6 05/22/2021 1142   ALBUMIN 4.5 05/22/2021 1142   AST 34 05/22/2021 1142   ALT 45 (H) 05/22/2021 1142   ALKPHOS 65 05/22/2021 1142   BILITOT 0.5 05/22/2021 1142   GFRNONAA 53 (L) 03/30/2017 0550   GFRAA >60 03/30/2017 0550       Component Value Date/Time   WBC 8.2 03/18/2018 1036   RBC 4.61  03/18/2018 1036   HGB 13.3 03/18/2018 1036   HCT 39.7 03/18/2018 1036   PLT 302.0 03/18/2018 1036   MCV 86.2 03/18/2018 1036   MCH 28.4 03/30/2017 0550   MCHC 33.5 03/18/2018 1036   RDW 14.0 03/18/2018 1036   LYMPHSABS 2.9 11/09/2014 0930   MONOABS 0.5 11/09/2014 0930   EOSABS 0.4 11/09/2014 0930   BASOSABS 0.0 11/09/2014 0930    No results found for: POCLITH, LITHIUM   No results found for: PHENYTOIN, PHENOBARB, VALPROATE, CBMZ   .res Assessment: Plan:    Plan:  PDMP reviewed  Zoloft '50mg'$  daily  PMDD screening tool completed initial session.  ADD testing - will call for an appt.  Time spent with patient was 25 minutes. Greater than 50% of face to face time with patient was spent on counseling and coordination of care.   RTC 3 months  Patient advised to contact office with any questions, adverse effects, or acute worsening in signs and symptoms.  Diagnoses and all orders for this visit:  PMDD (premenstrual dysphoric disorder) -     sertraline (ZOLOFT) 50 MG tablet; Take 1 tablet (50 mg total) by mouth daily.  Generalized anxiety disorder -     sertraline (ZOLOFT) 50 MG tablet; Take 1 tablet (50 mg total) by mouth daily.     Please see After Visit Summary for patient specific instructions.  Future Appointments  Date Time Provider [place]  12/12/2021 10:00 AM [name], [name] [facility] None  01/09/2022 10:00 AM [name], [name] [facility] None  01/23/2022 10:00 AM [name], [name]  [facility] None  02/06/2022 10:00 AM [name], [name] [facility] None  03/06/2022 10:00 AM [name], [name] [facility] None  03/20/2022 10:00 AM [name], [name] [facility] None  04/03/2022 10:00 AM [name], [name] [facility] None  04/17/2022 10:00 AM [name], [name] [facility] None  05/01/2022 10:00 AM [name], [name] [facility] None  05/15/2022 10:00 AM [name], [name] [facility] None  05/29/2022 10:00 AM [name], [name] [facility] None  06/12/2022 10:00 AM [name], [name] [facility] None  06/26/2022 10:00 AM [name], [name] [facility] None    No orders of the defined types were placed in this encounter.   -------------------------------

## 2021-12-12 ENCOUNTER — Ambulatory Visit (INDEPENDENT_AMBULATORY_CARE_PROVIDER_SITE_OTHER): Payer: BC Managed Care – PPO | Admitting: Psychology

## 2021-12-12 DIAGNOSIS — F3281 Premenstrual dysphoric disorder: Secondary | ICD-10-CM | POA: Diagnosis not present

## 2021-12-12 DIAGNOSIS — F411 Generalized anxiety disorder: Secondary | ICD-10-CM

## 2021-12-12 NOTE — Progress Notes (Signed)
[facility] Counselor/Therapist Progress Note  Patient ID: Angela Harrell, MRN: [id number]   Date: 12/12/21  Time Spent: 10:01 am -11:01 am : 60 Minutes  Treatment Type: Individual Therapy.  Reported Symptoms: depression and anxiety.  Mental Status Exam: Appearance:  Neat and Well Groomed     Behavior: Appropriate  Motor: Normal  Speech/Language:  Clear and Coherent  Affect: Appropriate  Mood: normal  Thought process: normal  Thought content:   WNL  Sensory/Perceptual disturbances:   WNL  Orientation: oriented to person, place, time/date, and situation  Attention: Good  Concentration: Good  Memory: WNL  Fund of knowledge:  Good  Insight:   Good  Judgment:  Good  Impulse Control: Good   Risk Assessment: Danger to Self:  No Self-injurious Behavior: No Danger to Others: No Duty to Warn:no Physical Aggression / Violence:No  Access to Firearms a concern: No  Gang Involvement:No   Subjective:   Angela Harrell participated from home, via video and consented to treatment. Therapist participated from home office. We met online due to [place] pandemic. Angela Harrell reviewed the events of the past week. She noted having a chaotic week due to numerous transitions. She noted "slightly disturbed nights" due to her cycle which is an improvement upon previous experiences pre-medication change. She noted having to challenge her father, during inappropriate jokes and comments by his father, and his negative reaction to this. She noted her grandmother recently being injured during a fall. She noted receiving critique from her grandmother regarding Angela Harrell's children taking time off, during the summer, after their school year ended. She provided additional history regarding her family, in particular, her brothers who had mental health and legal concerns. She noted speaking with her psychiatric provider and discussed the possibility of ADHD testing and was offered an evaluation, at a later date.  She noted upcoming transitions during the session. We reviewed coping during the session. Therapist normalized and validated Angela Harrell's feelings and experience and provided supportive therapy.    Interventions: Interpersonal  Diagnosis:  PMDD (premenstrual dysphoric disorder)  Generalized anxiety disorder  Treatment Plan:  Client Abilities/Strengths Angela Harrell is intelligent, forthcoming, and motivated for change. She has responded well to treatment, overall.   Support System: Husband, friends, mother.   Client Treatment Preferences Outpatient Therapy.   Client Statement of Needs Angela Harrell would like to engage in self-care, engage in enjoyable activities, process past events and current interpersonal dynamics, manage overall symptoms, engage in self-care, and bolster coping skills.   Treatment Level Weekly  Symptoms Anxiety: Feeling nervous, difficulty managing anxiety, difficulty relaxing, restlessness, irritability, rumination   (Status: maintained) Depression: Loss of interest, feeling down, middle insomnia, lethargy, feeling bad about self, difficulty managing attention/concentration.   (Status: improved)  Goals:  Angela Harrell experiences symptoms of depression and anxiety.   Target Date: 07/25/22 Frequency: Weekly  Progress: 10 Modality: individual    Therapist will provide referrals for additional resources as appropriate.  Therapist will provide psycho-education regarding Angela Harrell's diagnosis and corresponding treatment approaches and interventions. Licensed Clinical Social Worker, [place], [name] will support the patient's ability to achieve the goals identified. will employ CBT, BA, Problem-solving, Solution Focused, Mindfulness,  coping skills, & other evidenced-based practices will be used to promote progress towards healthy functioning to help manage decrease symptoms associated with her diagnosis.   Reduce overall level, frequency, and intensity of the feelings of depression and  anxiety evidenced by decreased overall symptoms from 6 to 7 days/week to 0 to 1 days/week per client report for at least  3 consecutive months. Verbally express understanding of the relationship between feelings of depression, anxiety and their impact on thinking patterns and behaviors. Verbalize an understanding of the role that distorted thinking plays in creating fears, excessive worry, and ruminations. Meet with psychiatric provider.     Angela Harrell participated in the creation of the treatment plan)  Angela Irish, [name]

## 2021-12-26 ENCOUNTER — Ambulatory Visit: Payer: BC Managed Care – PPO | Admitting: Psychology

## 2022-01-09 ENCOUNTER — Ambulatory Visit (INDEPENDENT_AMBULATORY_CARE_PROVIDER_SITE_OTHER): Payer: BC Managed Care – PPO | Admitting: Psychology

## 2022-01-09 DIAGNOSIS — F411 Generalized anxiety disorder: Secondary | ICD-10-CM | POA: Diagnosis not present

## 2022-01-09 DIAGNOSIS — F3281 Premenstrual dysphoric disorder: Secondary | ICD-10-CM

## 2022-01-09 NOTE — Progress Notes (Signed)
[facility] Counselor/Therapist Progress Note  Patient ID: Angela Harrell, MRN: [id number]   Date: 01/09/22  Time Spent: 10:06 am 11:01 am : 11:01 Minutes  Treatment Type: Individual Therapy.  Reported Symptoms: depression and anxiety.  Mental Status Exam: Appearance:  Neat and Well Groomed     Behavior: Appropriate  Motor: Normal  Speech/Language:  Clear and Coherent  Affect: Appropriate  Mood: normal  Thought process: normal  Thought content:   WNL  Sensory/Perceptual disturbances:   WNL  Orientation: oriented to person, place, time/date, and situation  Attention: Good  Concentration: Good  Memory: WNL  Fund of knowledge:  Good  Insight:   Good  Judgment:  Good  Impulse Control: Good   Risk Assessment: Danger to Self:  No Self-injurious Behavior: No Danger to Others: No Duty to Warn:no Physical Aggression / Violence:No  Access to Firearms a concern: No  Gang Involvement:No   Subjective:   Angela Harrell participated from home, via video and consented to treatment. Therapist participated from home office. We met online due to [place] pandemic. Angela Harrell reviewed the events of the past week. She endorsed disturbed sleep, recently, but could not identify a cause. She noted the onset of disturbing memories, which are not triggering. . She noted frustration regarding the political climate and many of her family's political leanings.  She noted often defending her children from family in regards to decision-making, parenting, and general approach. She noted receiving significant criticism as a child and noted her husband stating that she does not receive criticism well. She noted feeling unaccepted when criticism. We explored this, during the session, and the effects of her perception on her reaction towards others. She noted her friend's recent diagnosis of aggressive brain cancer. We explored this during the session. We reviewed coping during the session. Therapist  normalized and validated Angela Harrell's feelings and experience and provided supportive therapy.    Interventions: Interpersonal  Diagnosis:  PMDD (premenstrual dysphoric disorder)  Generalized anxiety disorder  Treatment Plan:  Client Abilities/Strengths Angela Harrell is intelligent, forthcoming, and motivated for change. She has responded well to treatment, overall.   Support System: Husband, friends, mother.   Client Treatment Preferences Outpatient Therapy.   Client Statement of Needs Angela Harrell would like to engage in self-care, engage in enjoyable activities, process past events and current interpersonal dynamics, manage overall symptoms, engage in self-care, and bolster coping skills.   Treatment Level Weekly  Symptoms Anxiety: Feeling nervous, difficulty managing anxiety, difficulty relaxing, restlessness, irritability, rumination   (Status: maintained) Depression: Loss of interest, feeling down, middle insomnia, lethargy, feeling bad about self, difficulty managing attention/concentration.   (Status: improved)  Goals:  Angela Harrell experiences symptoms of depression and anxiety.   Target Date: 07/25/22 Frequency: Weekly  Progress: 10 Modality: individual    Therapist will provide referrals for additional resources as appropriate.  Therapist will provide psycho-education regarding Angela Harrell's diagnosis and corresponding treatment approaches and interventions. Licensed Clinical Social Worker, [place], [name] will support the patient's ability to achieve the goals identified. will employ CBT, BA, Problem-solving, Solution Focused, Mindfulness,  coping skills, & other evidenced-based practices will be used to promote progress towards healthy functioning to help manage decrease symptoms associated with her diagnosis.   Reduce overall level, frequency, and intensity of the feelings of depression and anxiety evidenced by decreased overall symptoms from 6 to 7 days/week to 0 to 1 days/week per client  report for at least 3 consecutive months. Verbally express understanding of the relationship between feelings of depression, anxiety and  their impact on thinking patterns and behaviors. Verbalize an understanding of the role that distorted thinking plays in creating fears, excessive worry, and ruminations. Meet with psychiatric provider.     Angela Harrell participated in the creation of the treatment plan)  Angela Irish, [name]

## 2022-01-23 ENCOUNTER — Ambulatory Visit (INDEPENDENT_AMBULATORY_CARE_PROVIDER_SITE_OTHER): Payer: BC Managed Care – PPO | Admitting: Psychology

## 2022-01-23 DIAGNOSIS — F411 Generalized anxiety disorder: Secondary | ICD-10-CM

## 2022-01-23 DIAGNOSIS — F3281 Premenstrual dysphoric disorder: Secondary | ICD-10-CM

## 2022-01-23 NOTE — Progress Notes (Signed)
[facility] Counselor/Therapist Progress Note  Patient ID: Angela Harrell, MRN: [id number]   Date: 01/23/22  Time Spent: 10:06 am : 10:59 am : 53 Minutes  Treatment Type: Individual Therapy.  Reported Symptoms: depression and anxiety.  Mental Status Exam: Appearance:  Neat and Well Groomed     Behavior: Appropriate  Motor: Normal  Speech/Language:  Clear and Coherent  Affect: Appropriate  Mood: normal  Thought process: normal  Thought content:   WNL  Sensory/Perceptual disturbances:   WNL  Orientation: oriented to person, place, time/date, and situation  Attention: Good  Concentration: Good  Memory: WNL  Fund of knowledge:  Good  Insight:   Good  Judgment:  Good  Impulse Control: Good   Risk Assessment: Danger to Self:  No Self-injurious Behavior: No Danger to Others: No Duty to Warn:no Physical Aggression / Violence:No  Access to Firearms a concern: No  Gang Involvement:No   Subjective:   Angela Harrell participated from home, via video and consented to treatment. Therapist participated from home office. We met online due to [place] pandemic. Angela Harrell reviewed the events of the past week. She noted some improvement in her overall sleep. She noted her son's increasing anxiety and noted being prescribed sertraline to manage his anxiety symptoms. She noted her daughter, Angela Harrell, being referred for an ADHD diagnosis. She noted being contacted by someone attempting to find her brother Angela Harrell - [phone]), who she is estranged from. She noted her interest in writing a letter to her friend, Angela Harrell, who she has not heard from in some time. 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 We explored this, during the session, and ways to communicate without feeling responsibility to "fix". We explored her own interest in rebuilding the relationship. Therapist encouraged [name] to identify her wants and needs, going forward, and using this information to highlight her own needs  and boundaries. Therapist normalized and validated her feelings. Therapist normalized and validated [name]'s feelings and experience and provided supportive therapy.    Interventions: Interpersonal  Diagnosis:  PMDD (premenstrual dysphoric disorder)  Generalized anxiety disorder  Treatment Plan:  Client Abilities/Strengths [name] is intelligent, forthcoming, and motivated for change. She has responded well to treatment, overall.   Support System: Husband, friends, mother.   Client Treatment Preferences Outpatient Therapy.   Client Statement of Needs [name] would like to engage in self-care, engage in enjoyable activities, process past events and current interpersonal dynamics, manage overall symptoms, engage in self-care, and bolster coping skills.   Treatment Level Weekly  Symptoms Anxiety: Feeling nervous, difficulty managing anxiety, difficulty relaxing, restlessness, irritability, rumination   (Status: maintained) Depression: Loss of interest, feeling down, middle insomnia, lethargy, feeling bad about self, difficulty managing attention/concentration.   (Status: improved)  Goals:  [name] experiences symptoms of depression and anxiety.   Target Date: 07/25/22 Frequency: Weekly  Progress: 10 Modality: individual    Therapist will provide referrals for additional resources as appropriate.  Therapist will provide psycho-education regarding Cookie's diagnosis and corresponding treatment approaches and interventions. Licensed Clinical Social Worker, [place], [name] will support the patient's ability to achieve the goals identified. will employ CBT, BA, Problem-solving, Solution Focused, Mindfulness,  coping skills, & other evidenced-based practices will be used to promote progress towards healthy functioning to help manage decrease symptoms associated with her diagnosis.   Reduce overall level, frequency, and intensity of the feelings of depression and anxiety evidenced by  decreased overall symptoms from 6 to 7 days/week to 0 to 1 days/week per client report  for at least 3 consecutive months. Verbally express understanding of the relationship between feelings of depression, anxiety and their impact on thinking patterns and behaviors. Verbalize an understanding of the role that distorted thinking plays in creating fears, excessive worry, and ruminations. Meet with psychiatric provider.     Angela Harrell participated in the creation of the treatment plan)  [name], [name]

## 2022-02-06 ENCOUNTER — Ambulatory Visit (INDEPENDENT_AMBULATORY_CARE_PROVIDER_SITE_OTHER): Payer: BC Managed Care – PPO | Admitting: Psychology

## 2022-02-06 DIAGNOSIS — F411 Generalized anxiety disorder: Secondary | ICD-10-CM | POA: Diagnosis not present

## 2022-02-06 DIAGNOSIS — F3281 Premenstrual dysphoric disorder: Secondary | ICD-10-CM | POA: Diagnosis not present

## 2022-02-06 NOTE — Progress Notes (Signed)
[facility] Counselor/Therapist Progress Note  Patient ID: Angela Harrell, MRN: [id number]   Date: 02/06/22  Time Spent: 9:59 am : 11:00 am : 61 Minutes  Treatment Type: Individual Therapy.  Reported Symptoms: depression and anxiety.  Mental Status Exam: Appearance:  Neat and Well Groomed     Behavior: Appropriate  Motor: Normal  Speech/Language:  Clear and Coherent  Affect: Appropriate  Mood: normal  Thought process: normal  Thought content:   WNL  Sensory/Perceptual disturbances:   WNL  Orientation: oriented to person, place, time/date, and situation  Attention: Good  Concentration: Good  Memory: WNL  Fund of knowledge:  Good  Insight:   Good  Judgment:  Good  Impulse Control: Good   Risk Assessment: Danger to Self:  No Self-injurious Behavior: No Danger to Others: No Duty to Warn:no Physical Aggression / Violence:No  Access to Firearms a concern: No  Gang Involvement:No   Subjective:   [name] participated from home, via video and consented to treatment. Therapist participated from home office. We met online due to [place] pandemic. [name] reviewed the events of the past week. She noted significant stressors in varying areas in the past week. She noted crying on Monday and taking part of the day off. She noted her efforts to manage her mood via self-talk. She noted having a radar when things are off with males due to her history of trauma. We explored this during the session. She noted often being told what is "wrong with her" as a child. She noted this being a barrier to testing for a possible ADHD diagnosis and noted a family history. We began exploring this, during the session, and ways she has managed this in the past. Therapist praised [name] for her mindfulness of her self-talk and her attempts to manage this postiviely and proactively. We will continue to process this going forward.   Interventions: Cognitive Behavioral Therapy and  Interpersonal  Diagnosis:  PMDD (premenstrual dysphoric disorder)  Generalized anxiety disorder  Treatment Plan:  Client Abilities/Strengths [name] is intelligent, forthcoming, and motivated for change. She has responded well to treatment, overall.   Support System: Husband, friends, mother.   Client Treatment Preferences Outpatient Therapy.   Client Statement of Needs [name] would like to engage in self-care, engage in enjoyable activities, process past events and current interpersonal dynamics, manage overall symptoms, engage in self-care, and bolster coping skills.   Treatment Level Weekly  Symptoms Anxiety: Feeling nervous, difficulty managing anxiety, difficulty relaxing, restlessness, irritability, rumination   (Status: maintained) Depression: Loss of interest, feeling down, middle insomnia, lethargy, feeling bad about self, difficulty managing attention/concentration.   (Status: improved)  Goals:  [name] experiences symptoms of depression and anxiety.   Target Date: 07/25/22 Frequency: Weekly  Progress: 10 Modality: individual    Therapist will provide referrals for additional resources as appropriate.  Therapist will provide psycho-education regarding [name]'s diagnosis and corresponding treatment approaches and interventions. Licensed Clinical Social Worker, [place], [name] will support the patient's ability to achieve the goals identified. will employ CBT, BA, Problem-solving, Solution Focused, Mindfulness,  coping skills, & other evidenced-based practices will be used to promote progress towards healthy functioning to help manage decrease symptoms associated with her diagnosis.   Reduce overall level, frequency, and intensity of the feelings of depression and anxiety evidenced by decreased overall symptoms from 6 to 7 days/week to 0 to 1 days/week per client report for at least 3 consecutive months. Verbally express understanding of the relationship between feelings  of depression,  anxiety and their impact on thinking patterns and behaviors. Verbalize an understanding of the role that distorted thinking plays in creating fears, excessive worry, and ruminations. Meet with psychiatric provider.     [name] participated in the creation of the treatment plan)   [name], [name]

## 2022-02-20 ENCOUNTER — Ambulatory Visit (INDEPENDENT_AMBULATORY_CARE_PROVIDER_SITE_OTHER): Payer: BC Managed Care – PPO | Admitting: Psychology

## 2022-02-20 ENCOUNTER — Ambulatory Visit: Payer: BC Managed Care – PPO | Admitting: Psychology

## 2022-02-20 DIAGNOSIS — F411 Generalized anxiety disorder: Secondary | ICD-10-CM | POA: Diagnosis not present

## 2022-02-20 DIAGNOSIS — F3281 Premenstrual dysphoric disorder: Secondary | ICD-10-CM

## 2022-02-20 NOTE — Progress Notes (Signed)
[facility] Counselor/Therapist Progress Note  Patient ID: Angela Harrell, MRN: [id number]   Date: 02/20/22  Time Spent: 10:08 am - 11:02 am : 47  Minutes  Treatment Type: Individual Therapy.  Reported Symptoms: depression and anxiety.  Mental Status Exam: Appearance:  Neat and Well Groomed     Behavior: Appropriate  Motor: Normal  Speech/Language:  Clear and Coherent  Affect: Appropriate  Mood: normal  Thought process: normal  Thought content:   WNL  Sensory/Perceptual disturbances:   WNL  Orientation: oriented to person, place, time/date, and situation  Attention: Good  Concentration: Good  Memory: WNL  Fund of knowledge:  Good  Insight:   Good  Judgment:  Good  Impulse Control: Good   Risk Assessment: Danger to Self:  No Self-injurious Behavior: No Danger to Others: No Duty to Warn:no Physical Aggression / Violence:No  Access to Firearms a concern: No  Gang Involvement:No   Subjective:   Angela Harrell participated from home, via video and consented to treatment. Therapist participated from home office. We met online due to Angela Harrell pandemic. Angela Harrell reviewed the events of the past week. She noted upcoming transitions for her children and school. She noted her son's mood has improved due to being prescribed an antidepressant. Angela Harrell expressed relief regarding her son's improvement in mood and overall functioning. She noted global improvement in his functioning and an improved outlook. She noted a recent positive experience with her father who is struggling with dementia. We worked on acceptance of her father's cognitive decline and engaging in gratitude regarding positive moments. She noted some disturbed sleep during this past week but previously her sleep going well, overall. She noted typically receiving about 6 hours of sleep. We reviewed sleep hygiene during the session.  Therapist validated and normalized Angela Harrell's feelings and provided supportive therapy.    Interventions: Cognitive Behavioral Therapy and Interpersonal  Diagnosis:  PMDD (premenstrual dysphoric disorder)  Generalized anxiety disorder  Treatment Plan:  Client Abilities/Strengths Angela Harrell is intelligent, forthcoming, and motivated for change. She has responded well to treatment, overall.   Support System: Husband, friends, mother.   Client Treatment Preferences Outpatient Therapy.   Client Statement of Needs Angela Harrell would like to engage in self-care, engage in enjoyable activities, process past events and current interpersonal dynamics, manage overall symptoms, engage in self-care, and bolster coping skills.   Treatment Level Weekly  Symptoms Anxiety: Feeling nervous, difficulty managing anxiety, difficulty relaxing, restlessness, irritability, rumination   (Status: maintained) Depression: Loss of interest, feeling down, middle insomnia, lethargy, feeling bad about self, difficulty managing attention/concentration.   (Status: improved)  Goals:  Angela Harrell experiences symptoms of depression and anxiety.   Target Date: 07/25/22 Frequency: Weekly  Progress: 10 Modality: individual    Therapist will provide referrals for additional resources as appropriate.  Therapist will provide psycho-education regarding Angela Harrell's diagnosis and corresponding treatment approaches and interventions. Licensed Clinical Social Worker, [place], [name] will support the patient's ability to achieve the goals identified. will employ CBT, BA, Problem-solving, Solution Focused, Mindfulness,  coping skills, & other evidenced-based practices will be used to promote progress towards healthy functioning to help manage decrease symptoms associated with her diagnosis.   Reduce overall level, frequency, and intensity of the feelings of depression and anxiety evidenced by decreased overall symptoms from 6 to 7 days/week to 0 to 1 days/week per client report for at least 3 consecutive months. Verbally express  understanding of the relationship between feelings of depression, anxiety and their impact on thinking patterns and behaviors. Verbalize  an understanding of the role that distorted thinking plays in creating fears, excessive worry, and ruminations. Meet with psychiatric provider.     Angela Harrell participated in the creation of the treatment plan)   Angela Irish, [name]

## 2022-03-06 ENCOUNTER — Ambulatory Visit (INDEPENDENT_AMBULATORY_CARE_PROVIDER_SITE_OTHER): Payer: BC Managed Care – PPO | Admitting: Psychology

## 2022-03-06 DIAGNOSIS — F411 Generalized anxiety disorder: Secondary | ICD-10-CM | POA: Diagnosis not present

## 2022-03-06 NOTE — Progress Notes (Signed)
[facility] Counselor/Therapist Progress Note  Patient ID: Angela Harrell, MRN: [id number]   Date: 03/06/22  Time Spent: 10:08 am - 11:02 am : 74 Minutes  Treatment Type: Individual Therapy.  Reported Symptoms: depression and anxiety.  Mental Status Exam: Appearance:  Neat and Well Groomed     Behavior: Appropriate  Motor: Normal  Speech/Language:  Clear and Coherent  Affect: Appropriate  Mood: normal  Thought process: normal  Thought content:   WNL  Sensory/Perceptual disturbances:   WNL  Orientation: oriented to person, place, time/date, and situation  Attention: Good  Concentration: Good  Memory: WNL  Fund of knowledge:  Good  Insight:   Good  Judgment:  Good  Impulse Control: Good   Risk Assessment: Danger to Self:  No Self-injurious Behavior: No Danger to Others: No Duty to Warn:no Physical Aggression / Violence:No  Access to Firearms a concern: No  Gang Involvement:No   Subjective:   Angela Harrell participated from home, via video and consented to treatment. Therapist participated from home office. We met online due to Angela Harrell pandemic. Angela Harrell reviewed the events of the past week. She noted continued progress in relation to her son's mood and a reduction in her own anxiety as a result. She noted some stress regarding her scheduled mammogram slated for 03/07/22. 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 She noted difficulty setting boundaries with Angela Harrell and noted a need for additional boundaries. She noted distress when defending and asserting self due to family not being supportive of this in childhood. She noted a need to "defend and explain" and this being "a good portion of my personality". She noted her childhood playing a part in this. She noted lacking confidence that could affect her communication and boundary setting. We will explore this going  forward. Therapist praised Angela Harrell for her effort and provided supportive therapy.   Interventions: Cognitive Behavioral Therapy and Interpersonal  Diagnosis:  Generalized anxiety disorder  Treatment Plan:  Client Abilities/Strengths Angela Harrell is intelligent, forthcoming, and motivated for change. She has responded well to treatment, overall.   Support System: Husband, friends, mother.   Client Treatment Preferences Outpatient Therapy.   Client Statement of Needs Angela Harrell would like to engage in self-care, engage in enjoyable activities, process past events and current interpersonal dynamics, manage overall symptoms, engage in self-care, and bolster coping skills.   Treatment Level Weekly  Symptoms Anxiety: Feeling nervous, difficulty managing anxiety, difficulty relaxing, restlessness, irritability, rumination   (Status: maintained) Depression: Loss of interest, feeling down, middle insomnia, lethargy, feeling bad about self, difficulty managing attention/concentration.   (Status: improved)  Goals:  Angela Harrell experiences symptoms of depression and anxiety.   Target Date: 07/25/22 Frequency: Weekly  Progress: 10 Modality: individual    Therapist will provide referrals for additional resources as appropriate.  Therapist will provide psycho-education regarding Angela Harrell's diagnosis and corresponding treatment approaches and interventions. Licensed Clinical Social Worker, Angela Harrell, Angela Harrell will support the patient's ability to achieve the goals identified. will employ CBT, BA, Problem-solving, Solution Focused, Mindfulness,  coping skills, & other evidenced-based practices will be used to promote progress towards healthy functioning to help manage decrease symptoms associated with her diagnosis.   Reduce overall level, frequency, and intensity of the feelings of depression and anxiety evidenced by decreased overall symptoms from 6 to 7 days/week to 0 to 1 days/week per client report for at least  3 consecutive months. Verbally express understanding of the  relationship between feelings of depression, anxiety and their impact on thinking patterns and behaviors. Verbalize an understanding of the role that distorted thinking plays in creating fears, excessive worry, and ruminations. Meet with psychiatric provider.     Angela Harrell participated in the creation of the treatment plan)   Angela Harrell, Angela Harrell

## 2022-03-07 ENCOUNTER — Encounter: Payer: Self-pay | Admitting: Adult Health

## 2022-03-07 ENCOUNTER — Ambulatory Visit (INDEPENDENT_AMBULATORY_CARE_PROVIDER_SITE_OTHER): Payer: BC Managed Care – PPO | Admitting: Adult Health

## 2022-03-07 DIAGNOSIS — F411 Generalized anxiety disorder: Secondary | ICD-10-CM

## 2022-03-07 DIAGNOSIS — F3281 Premenstrual dysphoric disorder: Secondary | ICD-10-CM | POA: Diagnosis not present

## 2022-03-07 DIAGNOSIS — Z1231 Encounter for screening mammogram for malignant neoplasm of breast: Secondary | ICD-10-CM | POA: Diagnosis not present

## 2022-03-07 LAB — HM MAMMOGRAPHY

## 2022-03-07 NOTE — Progress Notes (Signed)
Angela Harrell [id number] 10-15-1975 46 y.o.  Subjective:   Patient ID:  Angela Harrell is a 46 y.o. (DOB 1975/08/11) female.  Chief Complaint: No chief complaint on file.   HPI Angela Harrell presents to the office today for follow-up of PMDD and GAD.  Describes mood today as "ok". Pleasant. Decreased tearfulness - appropritate. Mood symptoms - reports decreased depression, anxiety, and irritability - "still there, but better". Reports decreased worry and rumination. Mood is consistent. Stating "I'm feeling quite a bit better". Feels like the Zoloft is working well. Family doing well. Seeing therapist. Improved interest and motivation. Taking current medications as prescribed.  Energy levels improved. Active, does not have a regular exercise routine.   Enjoys some usual interests and activities. Married 23 years - 3 children. Parents local - grandmother. Spending time with family. Appetite adequate. Weight stable - 190's pounds. Sleeps well most nights. Averages 6 hours. Napping some during the day - 1 or 2 times a week. Focus and concentration stable. Completing tasks. Managing aspects of household. 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 Denies SI or HI.  Denies AH or VH. Denies self harm. Denies substance use.  History of seasonal depression.  Previous medication trials: Celexa '10mg'$  daily   PHQ2-9    [facility] Office Visit from 05/22/2021 in [facility] from 07/17/2018 in [facility] from 03/18/2018 in [facility] from 11/09/2014 in [facility]  PHQ-2 Total Score 0 '2 3 1  '$ PHQ-9 Total Score '3 6 8 '$ --        Review of Systems:  Review of Systems  Musculoskeletal:  Negative for gait problem.  Neurological:  Negative for tremors.  Psychiatric/Behavioral:         Please refer to HPI    Medications: I have reviewed the patient's current medications.  Current  Outpatient Medications  Medication Sig Dispense Refill   albuterol (PROVENTIL HFA) 108 (90 Base) MCG/ACT inhaler Inhale 2 puffs into the lungs every 6 (six) hours as needed for wheezing or shortness of breath. 1 Inhaler 3   cetirizine (ZYRTEC) 10 MG tablet Take 10 mg by mouth daily.     Cholecalciferol (VITAMIN D3 PO) Take 2,000 mg by mouth daily.      LUTEIN PO Take 1 capsule by mouth daily.     Multiple Vitamins-Minerals (MULTIVITAMIN WITH MINERALS) tablet Take 1 tablet by mouth daily.     Omega-3 Fatty Acids (FISH OIL) 1000 MG CAPS Take by mouth.     omeprazole (PRILOSEC) 20 MG capsule Take 1 capsule (20 mg total) by mouth daily before breakfast. (Patient not taking: Reported on 05/22/2021) 30 capsule 1   Probiotic Product (PROBIOTIC PO) Take 1 tablet by mouth every other day.     PSYLLIUM HUSK PO Take 2 capsules by mouth daily.     rosuvastatin (CRESTOR) 10 MG tablet Take 1 tablet (10 mg total) by mouth daily. 90 tablet 3   sertraline (ZOLOFT) 50 MG tablet Take 1 tablet (50 mg total) by mouth daily. 90 tablet 1   SPIRULINA PO Take 1 tablet by mouth daily. For food allergy prevention     Triamcinolone Acetonide (TRIAMCINOLONE 0.1 % CREAM : EUCERIN) CREA Apply 1 application topically 2 (two) times daily as needed for rash or itching. 500 each 1   Turmeric Curcumin 500 MG CAPS Take by mouth.     No current facility-administered medications for this visit.    Medication  Side Effects: None  Allergies:  Allergies  Allergen Reactions   Beef-Derived Products Diarrhea    Nausea and vomiting   Prochlorperazine Edisylate Shortness Of Breath    Compazine Tongue swelling   Prochlorperazine Edisylate Swelling    Swelling of throat   Tree Extract Anaphylaxis    Throat itchy, roof of mouth gets raw   Banana Itching and Nausea And Vomiting    Throat gets itchy   Beef (Bovine) Protein     Diarrhea and stomach cramps    Oxycodone-Acetaminophen     Vomiting   Permethrin Rash    Rash and  fever   Statins Itching   Morphine And Related Itching   Other     Tree Nuts (can have peanuts and cashews) Pyrethrine (ingredient in lice products)- fever and rash   Oxycodone-Acetaminophen Nausea And Vomiting   Latex Hives and Rash    Rash    Past Medical History:  Diagnosis Date   ALLERGIC RHINITIS 05/31/2008   Anemia    ANXIETY 05/31/2008   ASTHMA 05/31/2008   Asthma    CHOLELITHIASIS 05/31/2008   High blood pressure    readings   History of kidney stones    History of right breast biopsy 2017   HYPERLIPIDEMIA 05/31/2008   Kidney stone    NEPHROLITHIASIS, HX OF 05/31/2008    Past Medical History, Surgical history, Social history, and Family history were reviewed and updated as appropriate.   Please see review of systems for further details on the patient's review from today.   Objective:   Physical Exam:  There were no vitals taken for this visit.  Physical Exam Constitutional:      General: She is not in acute distress. Musculoskeletal:        General: No deformity.  Neurological:     Mental Status: She is alert and oriented to person, place, and time.     Coordination: Coordination normal.  Psychiatric:        Attention and Perception: Attention and perception normal. She does not perceive auditory or visual hallucinations.        Mood and Affect: Mood normal. Mood is not anxious or depressed. Affect is not labile, blunt, angry or inappropriate.        Speech: Speech normal.        Behavior: Behavior normal.        Thought Content: Thought content normal. Thought content is not paranoid or delusional. Thought content does not include homicidal or suicidal ideation. Thought content does not include homicidal or suicidal plan.        Cognition and Memory: Cognition and memory normal.        Judgment: Judgment normal.     Comments: Insight intact     Lab Review:     Component Value Date/Time   NA 135 05/22/2021 1142   K 4.3 05/22/2021 1142   CL 99 05/22/2021  1142   CO2 29 05/22/2021 1142   GLUCOSE 95 05/22/2021 1142   BUN 13 05/22/2021 1142   CREATININE 0.84 05/22/2021 1142   CALCIUM 9.7 05/22/2021 1142   PROT 7.6 05/22/2021 1142   ALBUMIN 4.5 05/22/2021 1142   AST 34 05/22/2021 1142   ALT 45 (H) 05/22/2021 1142   ALKPHOS 65 05/22/2021 1142   BILITOT 0.5 05/22/2021 1142   GFRNONAA 53 (L) 03/30/2017 0550   GFRAA >60 03/30/2017 0550       Component Value Date/Time   WBC 8.2 03/18/2018 1036   RBC 4.61  03/18/2018 1036   HGB 13.3 03/18/2018 1036   HCT 39.7 03/18/2018 1036   PLT 302.0 03/18/2018 1036   MCV 86.2 03/18/2018 1036   MCH 28.4 03/30/2017 0550   MCHC 33.5 03/18/2018 1036   RDW 14.0 03/18/2018 1036   LYMPHSABS 2.9 11/09/2014 0930   MONOABS 0.5 11/09/2014 0930   EOSABS 0.4 11/09/2014 0930   BASOSABS 0.0 11/09/2014 0930    No results found for: "POCLITH", "LITHIUM"   No results found for: "PHENYTOIN", "PHENOBARB", "VALPROATE", "CBMZ"   .res Assessment: Plan:    Plan:  PDMP reviewed  Zoloft '50mg'$  daily  PMDD screening tool completed initial session.  Considering ADD testing.  Time spent with patient was 15 minutes. Greater than 50% of face to face time with patient was spent on counseling and coordination of care.   RTC 4 months  Patient advised to contact office with any questions, adverse effects, or acute worsening in signs and symptoms.  Diagnoses and all orders for this visit:  Generalized anxiety disorder  PMDD (premenstrual dysphoric disorder)     Please see After Visit Summary for patient specific instructions.  Future Appointments  Date Time Provider [place]  03/20/2022 10:00 AM [name], [name] [facility] None  04/03/2022 10:00 AM [name], [name] [facility] None  04/17/2022 10:00 AM [name], [name] [facility] None  05/01/2022 10:00 AM [name], [name] [facility] None  05/15/2022 10:00 AM [name], [name] [facility] None  05/29/2022 10:00 AM [name], [name]  [facility] None  06/12/2022 10:00 AM [name], [name] [facility] None  06/26/2022 10:00 AM [name], [name] [facility] None    No orders of the defined types were placed in this encounter.   -------------------------------

## 2022-03-08 ENCOUNTER — Encounter: Payer: Self-pay | Admitting: Family Medicine

## 2022-03-20 ENCOUNTER — Ambulatory Visit (INDEPENDENT_AMBULATORY_CARE_PROVIDER_SITE_OTHER): Payer: BC Managed Care – PPO | Admitting: Psychology

## 2022-03-20 DIAGNOSIS — F411 Generalized anxiety disorder: Secondary | ICD-10-CM | POA: Diagnosis not present

## 2022-03-20 DIAGNOSIS — F3281 Premenstrual dysphoric disorder: Secondary | ICD-10-CM

## 2022-03-20 NOTE — Progress Notes (Signed)
[facility] Counselor/Therapist Progress Note  Patient ID: Angela Harrell, MRN: [id number]   Date: 03/20/22  Time Spent: 10:03 am - 11:03 am :  60 Minutes  Treatment Type: Individual Therapy.  Reported Symptoms: depression and anxiety.  Mental Status Exam: Appearance:  Neat and Well Groomed     Behavior: Appropriate  Motor: Normal  Speech/Language:  Clear and Coherent  Affect: Congruent  Mood: anxious and sad  Thought process: normal  Thought content:   WNL  Sensory/Perceptual disturbances:   WNL  Orientation: oriented to person, place, time/date, and situation  Attention: Good  Concentration: Good  Memory: WNL  Fund of knowledge:  Good  Insight:   Good  Judgment:  Good  Impulse Control: Good   Risk Assessment: Danger to Self:  No Self-injurious Behavior: No Danger to Others: No Duty to Warn:no Physical Aggression / Violence:No  Access to Firearms a concern: No  Gang Involvement:No   Subjective:   Angela Harrell participated from home, via video and consented to treatment. Therapist participated from home office. We met online due to [place] pandemic. Angela Harrell reviewed the events of the past week. Angela Harrell noted being jittery this past week and experiencing interpersonal stressors. She noted stressors with Angela Harrell resulted in Angela Harrell going to her car and crying. She noted difficulty giving feedback due to childhood and her father being demonstrative and punitive. She noted barriers including power dynamics, being non-confrontational, possible repercussions, & fear of having to leave the group. She noted worry that she might physically ill. She noted "sucking" at doing it for myself, taking care of and standing up for self. She noted worry that she would be in the forefront should she set boundaries with Angela Harrell. She noted a need to practice this and build a level of comfort. We processed this during the session. Therapist praised Angela Harrell for her effort and provided supportive  therapy.   Interventions: Cognitive Behavioral Therapy and Interpersonal  Diagnosis:  Generalized anxiety disorder  PMDD (premenstrual dysphoric disorder)  Treatment Plan:  Client Abilities/Strengths Heavyn is intelligent, forthcoming, and motivated for change. She has responded well to treatment, overall.   Support System: Husband, friends, mother.   Client Treatment Preferences Outpatient Therapy.   Client Statement of Needs Angela Harrell would like to engage in self-care, engage in enjoyable activities, process past events and current interpersonal dynamics, manage overall symptoms, engage in self-care, and bolster coping skills.   Treatment Level Weekly  Symptoms Anxiety: Feeling nervous, difficulty managing anxiety, difficulty relaxing, restlessness, irritability, rumination   (Status: maintained) Depression: Loss of interest, feeling down, middle insomnia, lethargy, feeling bad about self, difficulty managing attention/concentration.   (Status: maintained)  Goals:  Angela Harrell experiences symptoms of depression and anxiety.   Target Date: 07/25/22 Frequency: Weekly  Progress: 10 Modality: individual    Therapist will provide referrals for additional resources as appropriate.  Therapist will provide psycho-education regarding Angela Harrell's diagnosis and corresponding treatment approaches and interventions. Licensed Clinical Social Worker, [place], [name] will support the patient's ability to achieve the goals identified. will employ CBT, BA, Problem-solving, Solution Focused, Mindfulness,  coping skills, & other evidenced-based practices will be used to promote progress towards healthy functioning to help manage decrease symptoms associated with her diagnosis.   Reduce overall level, frequency, and intensity of the feelings of depression and anxiety evidenced by decreased overall symptoms from 6 to 7 days/week to 0 to 1 days/week per client report for at least 3 consecutive  months. Verbally express understanding of the relationship between feelings of  depression, anxiety and their impact on thinking patterns and behaviors. Verbalize an understanding of the role that distorted thinking plays in creating fears, excessive worry, and ruminations. Meet with psychiatric provider.     Angela Harrell participated in the creation of the treatment plan)   [name], [name]

## 2022-03-28 ENCOUNTER — Other Ambulatory Visit: Payer: Self-pay | Admitting: Adult Health

## 2022-03-28 DIAGNOSIS — F3281 Premenstrual dysphoric disorder: Secondary | ICD-10-CM

## 2022-03-28 DIAGNOSIS — F411 Generalized anxiety disorder: Secondary | ICD-10-CM

## 2022-03-30 ENCOUNTER — Other Ambulatory Visit: Payer: Self-pay | Admitting: Family Medicine

## 2022-04-03 ENCOUNTER — Ambulatory Visit (INDEPENDENT_AMBULATORY_CARE_PROVIDER_SITE_OTHER): Payer: BC Managed Care – PPO | Admitting: Psychology

## 2022-04-03 DIAGNOSIS — F3281 Premenstrual dysphoric disorder: Secondary | ICD-10-CM

## 2022-04-03 DIAGNOSIS — F411 Generalized anxiety disorder: Secondary | ICD-10-CM

## 2022-04-03 NOTE — Progress Notes (Signed)
[facility] Counselor/Therapist Progress Note  Patient ID: Angela Harrell, MRN: [id number]   Date: 04/03/22  Time Spent: 10:04 am - 11:03 am :  78 Minutes  Treatment Type: Individual Therapy.  Reported Symptoms: depression and anxiety.  Mental Status Exam: Appearance:  Neat and Well Groomed     Behavior: Appropriate  Motor: Normal  Speech/Language:  Clear and Coherent  Affect: Congruent  Mood: anxious and sad  Thought process: normal  Thought content:   WNL  Sensory/Perceptual disturbances:   WNL  Orientation: oriented to person, place, time/date, and situation  Attention: Good  Concentration: Good  Memory: WNL  Fund of knowledge:  Good  Insight:   Good  Judgment:  Good  Impulse Control: Good   Risk Assessment: Danger to Self:  No Self-injurious Behavior: No Danger to Others: No Duty to Warn:no Physical Aggression / Violence:No  Access to Firearms a concern: No  Gang Involvement:No   Subjective:   Angela Harrell participated from home, via video and consented to treatment. Therapist participated from home office. We met online due to [place] pandemic. Angela Harrell reviewed the events of the past week. Angela Harrell noted having long days for her volunteering opportunities. She noted this being a busy time of year. She noted reduced stress with Angela Harrell. She noted the fall being her "happier time of year". She noted her father's alzheimer and the difficulty dealing with his behavior and noted this creating stress. She noted his decline in functioning and her mother's lack of pursuit of respite care. She noted the family's attempts to avoid triggering him. She noted being quite busy. 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 We discussed her ability to be assertive in relation to her increased confidence. She noted a feeling of having to defend self when making choices including "what I am doing" and "How I am doing". She noted doing  this even when things go positively. She noted downplaying the compliments she receives. Therapist encouraged Angela Harrell to process this between sessions to identify possible contributing factors along with how it feels to receive compliments. Therapist praised Angela Harrell for her effort and provided supportive therapy.   Interventions: Cognitive Behavioral Therapy and Interpersonal  Diagnosis:  Generalized anxiety disorder  PMDD (premenstrual dysphoric disorder)  Treatment Plan:  Client Abilities/Strengths Angela Harrell is intelligent, forthcoming, and motivated for change. She has responded well to treatment, overall.   Support System: Husband, friends, mother.   Client Treatment Preferences Outpatient Therapy.   Client Statement of Needs Angela Harrell would like to engage in self-care, engage in enjoyable activities, process past events and current interpersonal dynamics, manage overall symptoms, engage in self-care, and bolster coping skills.   Treatment Level Weekly  Symptoms Anxiety: Feeling nervous, difficulty managing anxiety, difficulty relaxing, restlessness, irritability, rumination   (Status: maintained) Depression: Loss of interest, feeling down, middle insomnia, lethargy, feeling bad about self, difficulty managing attention/concentration.   (Status: maintained)  Goals:  Angela Harrell experiences symptoms of depression and anxiety.   Target Date: 07/25/22 Frequency: Weekly  Progress: 10 Modality: individual    Therapist will provide referrals for additional resources as appropriate.  Therapist will provide psycho-education regarding Angela Harrell's diagnosis and corresponding treatment approaches and interventions. Licensed Clinical Social Worker, [place], [name] will support the patient's ability to achieve the goals identified. will employ CBT, BA, Problem-solving, Solution Focused, Mindfulness,  coping skills, & other evidenced-based practices will be used to promote progress towards healthy  functioning to help manage decrease symptoms  associated with her diagnosis.   Reduce overall level, frequency, and intensity of the feelings of depression and anxiety evidenced by decreased overall symptoms from 6 to 7 days/week to 0 to 1 days/week per client report for at least 3 consecutive months. Verbally express understanding of the relationship between feelings of depression, anxiety and their impact on thinking patterns and behaviors. Verbalize an understanding of the role that distorted thinking plays in creating fears, excessive worry, and ruminations. Meet with psychiatric provider.     Angela Harrell participated in the creation of the treatment plan)   [name], [name]

## 2022-04-17 ENCOUNTER — Ambulatory Visit (INDEPENDENT_AMBULATORY_CARE_PROVIDER_SITE_OTHER): Payer: BC Managed Care – PPO | Admitting: Psychology

## 2022-04-17 DIAGNOSIS — F411 Generalized anxiety disorder: Secondary | ICD-10-CM

## 2022-04-17 NOTE — Progress Notes (Signed)
[facility] Counselor/Therapist Progress Note  Patient ID: Angela Harrell, MRN: [id number]   Date: 04/17/22  Time Spent: 10:06 am - 11:01 am :  55 Minutes  Treatment Type: Individual Therapy.  Reported Symptoms: depression and anxiety.  Mental Status Exam: Appearance:  Neat and Well Groomed     Behavior: Appropriate  Motor: Normal  Speech/Language:  Clear and Coherent  Affect: Congruent  Mood: normal  Thought process: normal  Thought content:   WNL  Sensory/Perceptual disturbances:   WNL  Orientation: oriented to person, place, time/date, and situation  Attention: Good  Concentration: Good  Memory: WNL  Fund of knowledge:  Good  Insight:   Good  Judgment:  Good  Impulse Control: Good   Risk Assessment: Danger to Self:  No Self-injurious Behavior: No Danger to Others: No Duty to Warn:no Physical Aggression / Violence:No  Access to Firearms a concern: No  Gang Involvement:No   Subjective:   Angela Harrell participated from home, via video and consented to treatment. Therapist participated from home office. We met online due to [place] pandemic. Angela Harrell reviewed the events of the past week. She noted feeling tired and busy due to her volunteering. She noted working on setting boundaries at this opportunity. She noted poor sleep as a result. She noted middle insomnia during this time. She noted working on navigating interpersonal stressors and working on giving positive feedback. She noted her family history of anxiety and her husband's, which appears to have affected her children in varying ways. She finds medication being helpful for herself but noted continued anxiety. We discussed working on making changes around her time of her cycle to account for the reoccurring poor sleep including reduction of caffeine, governing emotional, physical, and psychological output, and being mindful. Therapist praised Angela Harrell for her effort and provided supportive therapy.    Interventions: Interpersonal  Diagnosis:  Generalized anxiety disorder  Treatment Plan:  Client Abilities/Strengths Angela Harrell is intelligent, forthcoming, and motivated for change. She has responded well to treatment, overall.   Support System: Husband, friends, mother.   Client Treatment Preferences Outpatient Therapy.   Client Statement of Needs Angela Harrell would like to engage in self-care, engage in enjoyable activities, process past events and current interpersonal dynamics, manage overall symptoms, engage in self-care, and bolster coping skills.   Treatment Level Weekly  Symptoms Anxiety: Feeling nervous, difficulty managing anxiety, difficulty relaxing, restlessness, irritability, rumination   (Status: maintained) Depression: Loss of interest, feeling down, middle insomnia, lethargy, feeling bad about self, difficulty managing attention/concentration.   (Status: maintained)  Goals:  Angela Harrell experiences symptoms of depression and anxiety.   Target Date: 07/25/22 Frequency: Weekly  Progress: 10 Modality: individual    Therapist will provide referrals for additional resources as appropriate.  Therapist will provide psycho-education regarding Angela Harrell's diagnosis and corresponding treatment approaches and interventions. Licensed Clinical Social Worker, [place], [name] will support the patient's ability to achieve the goals identified. will employ CBT, BA, Problem-solving, Solution Focused, Mindfulness,  coping skills, & other evidenced-based practices will be used to promote progress towards healthy functioning to help manage decrease symptoms associated with her diagnosis.   Reduce overall level, frequency, and intensity of the feelings of depression and anxiety evidenced by decreased overall symptoms from 6 to 7 days/week to 0 to 1 days/week per client report for at least 3 consecutive months. Verbally express understanding of the relationship between feelings of depression,  anxiety and their impact on thinking patterns and behaviors. Verbalize an understanding of the role that distorted thinking  plays in creating fears, excessive worry, and ruminations. Meet with psychiatric provider.     Angela Harrell participated in the creation of the treatment plan)   Angela Irish, [name]

## 2022-05-01 ENCOUNTER — Ambulatory Visit (INDEPENDENT_AMBULATORY_CARE_PROVIDER_SITE_OTHER): Payer: BC Managed Care – PPO | Admitting: Psychology

## 2022-05-01 DIAGNOSIS — F3281 Premenstrual dysphoric disorder: Secondary | ICD-10-CM | POA: Diagnosis not present

## 2022-05-01 DIAGNOSIS — F411 Generalized anxiety disorder: Secondary | ICD-10-CM

## 2022-05-01 NOTE — Progress Notes (Signed)
[place] Counselor/Therapist Progress Note  Patient ID: Angela Harrell, MRN: [id number]   Date: 05/01/22  Time Spent: 10:04 am - 11:00 am :  56 Minutes  Treatment Type: Individual Therapy.  Reported Symptoms: depression and anxiety.  Mental Status Exam: Appearance:  Neat and Well Groomed     Behavior: Appropriate  Motor: Normal  Speech/Language:  Clear and Coherent  Affect: Congruent  Mood: normal  Thought process: normal  Thought content:   WNL  Sensory/Perceptual disturbances:   WNL  Orientation: oriented to person, place, time/date, and situation  Attention: Good  Concentration: Good  Memory: WNL  Fund of knowledge:  Good  Insight:   Good  Judgment:  Good  Impulse Control: Good   Risk Assessment: Danger to Self:  No Self-injurious Behavior: No Danger to Others: No Duty to Warn:no Physical Aggression / Violence:No  Access to Firearms a concern: No  Gang Involvement:No   Subjective:   Angela Harrell participated from home, via video and consented to treatment. Therapist participated from home office. We met online due to [place] pandemic. Angela Harrell reviewed the events of the past week. Angela Harrell  noted her daughter's (Angela Harrell) health and her numerous health and mental health ailments.She noted her children having seasonal depression and she endorsed this as well. We discussed how she has coped with the weather and time change. She noted employing a therapy lamp. Additionally, she discussed spending more time outside gardening, bird watching, and schedule more social events. She noted experiencing interpersonal stressors with a volunteer and noted working on setting boundaries and communicating assertively. She noted feeling shaky afterwards. She noted being more assertive in the past month and the benefits of this. Therapist praised Angela Harrell for her effort to become more communicative and assertive. Therapist praised Angela Harrell for her effort and provided supportive therapy.    Interventions: Interpersonal  Diagnosis:  Generalized anxiety disorder  PMDD (premenstrual dysphoric disorder)  Treatment Plan:  Client Abilities/Strengths Angela Harrell is intelligent, forthcoming, and motivated for change. She has responded well to treatment, overall.   Support System: Husband, friends, mother.   Client Treatment Preferences Outpatient Therapy.   Client Statement of Needs Angela Harrell would like to engage in self-care, engage in enjoyable activities, process past events and current interpersonal dynamics, manage overall symptoms, engage in self-care, and bolster coping skills.   Treatment Level Weekly  Symptoms Anxiety: Feeling nervous, difficulty managing anxiety, difficulty relaxing, restlessness, irritability, rumination   (Status: maintained) Depression: Loss of interest, feeling down, middle insomnia, lethargy, feeling bad about self, difficulty managing attention/concentration.   (Status: maintained)  Goals:  Angela Harrell experiences symptoms of depression and anxiety.   Target Date: 07/25/22 Frequency: Weekly  Progress: 10 Modality: individual    Therapist will provide referrals for additional resources as appropriate.  Therapist will provide psycho-education regarding Angela Harrell diagnosis and corresponding treatment approaches and interventions. Licensed Clinical Social Worker, [place], [name] will support the patient's ability to achieve the goals identified. will employ CBT, BA, Problem-solving, Solution Focused, Mindfulness,  coping skills, & other evidenced-based practices will be used to promote progress towards healthy functioning to help manage decrease symptoms associated with her diagnosis.   Reduce overall level, frequency, and intensity of the feelings of depression and anxiety evidenced by decreased overall symptoms from 6 to 7 days/week to 0 to 1 days/week per client report for at least 3 consecutive months. Verbally express understanding of the  relationship between feelings of depression, anxiety and their impact on thinking patterns and behaviors. Verbalize an understanding of the  role that distorted thinking plays in creating fears, excessive worry, and ruminations. Meet with psychiatric provider.     Angela Harrell participated in the creation of the treatment plan)   [name], [name]

## 2022-05-15 ENCOUNTER — Ambulatory Visit (INDEPENDENT_AMBULATORY_CARE_PROVIDER_SITE_OTHER): Payer: BC Managed Care – PPO | Admitting: Psychology

## 2022-05-15 DIAGNOSIS — F411 Generalized anxiety disorder: Secondary | ICD-10-CM

## 2022-05-15 NOTE — Progress Notes (Signed)
[place] Counselor/Therapist Progress Note  Patient ID: Angela Harrell, MRN: [id number]   Date: 05/15/22  Time Spent: 10:04 am - 11:00 am :  56 Minutes  Treatment Type: Individual Therapy.  Reported Symptoms: depression and anxiety.  Mental Status Exam: Appearance:  Neat and Well Groomed     Behavior: Appropriate  Motor: Normal  Speech/Language:  Clear and Coherent  Affect: Congruent  Mood: normal  Thought process: normal  Thought content:   WNL  Sensory/Perceptual disturbances:   WNL  Orientation: oriented to person, place, time/date, and situation  Attention: Good  Concentration: Good  Memory: WNL  Fund of knowledge:  Good  Insight:   Good  Judgment:  Good  Impulse Control: Good   Risk Assessment: Danger to Self:  No Self-injurious Behavior: No Danger to Others: No Duty to Warn:no Physical Aggression / Violence:No  Access to Firearms a concern: No  Gang Involvement:No   Subjective:   [name] participated from home, via video and consented to treatment. Therapist participated from home office. We met online due to [place] pandemic. [name] reviewed the events of the past week. Cam noted feeling "blah". We explored this during the session. We work on identifying the possible holiday's as a stressor due to possible travel & a lack of family spending time together. She noted often having to defend self to family and noted this beginning in childhood due to her brothers' behavior. We discussed ways to manage this going forward via redirection and boundary setting. She noted the stressors of current world events and working on building nuance and dealing with the stressors of the events. Therapist praised [place] for her effort to identify boundaries during the session for family members. Therapist praised [name] for her effort and provided supportive therapy.   Interventions: Interpersonal  Diagnosis:  Generalized anxiety disorder  Psychiatric Treatment:  Yes; [name], NP - [facility] .   Treatment Plan:  Client Abilities/Strengths Toriana is intelligent, forthcoming, and motivated for change. She has responded well to treatment, overall.   Support System: Husband, friends, mother.   Client Treatment Preferences Outpatient Therapy.   Client Statement of Needs Celines would like to engage in self-care, engage in enjoyable activities, process past events and current interpersonal dynamics, manage overall symptoms, engage in self-care, and bolster coping skills.   Treatment Level Weekly  Symptoms Anxiety: Feeling nervous, difficulty managing anxiety, difficulty relaxing, restlessness, irritability, rumination   (Status: maintained) Depression: Loss of interest, feeling down, middle insomnia, lethargy, feeling bad about self, difficulty managing attention/concentration.   (Status: maintained)  Goals:  Cinda experiences symptoms of depression and anxiety.   Target Date: 07/25/22 Frequency: Weekly  Progress: 10 Modality: individual    Therapist will provide referrals for additional resources as appropriate.  Therapist will provide psycho-education regarding Marlayna's diagnosis and corresponding treatment approaches and interventions. Licensed Clinical Social Worker, [place], [name] will support the patient's ability to achieve the goals identified. will employ CBT, BA, Problem-solving, Solution Focused, Mindfulness,  coping skills, & other evidenced-based practices will be used to promote progress towards healthy functioning to help manage decrease symptoms associated with her diagnosis.   Reduce overall level, frequency, and intensity of the feelings of depression and anxiety evidenced by decreased overall symptoms from 6 to 7 days/week to 0 to 1 days/week per client report for at least 3 consecutive months. Verbally express understanding of the relationship between feelings of depression, anxiety and their impact  on thinking patterns and behaviors. Verbalize an understanding of  the role that distorted thinking plays in creating fears, excessive worry, and ruminations. Meet with psychiatric provider.     [name] participated in the creation of the treatment plan)   [name], [name]

## 2022-05-29 ENCOUNTER — Ambulatory Visit (INDEPENDENT_AMBULATORY_CARE_PROVIDER_SITE_OTHER): Payer: BC Managed Care – PPO | Admitting: Psychology

## 2022-05-29 DIAGNOSIS — F411 Generalized anxiety disorder: Secondary | ICD-10-CM | POA: Diagnosis not present

## 2022-05-29 DIAGNOSIS — F3281 Premenstrual dysphoric disorder: Secondary | ICD-10-CM | POA: Diagnosis not present

## 2022-05-29 NOTE — Progress Notes (Signed)
[place] Counselor/Therapist Progress Note  Patient ID: Angela Harrell, MRN: [id number]   Date: 05/29/22  Time Spent: 10:06 am - 11:00 am :  54 Minutes  Treatment Type: Individual Therapy.  Reported Symptoms: depression and anxiety.  Mental Status Exam: Appearance:  Neat and Well Groomed     Behavior: Appropriate  Motor: Normal  Speech/Language:  Clear and Coherent  Affect: Congruent  Mood: anxious  Thought process: normal  Thought content:   WNL  Sensory/Perceptual disturbances:   WNL  Orientation: oriented to person, place, time/date, and situation  Attention: Good  Concentration: Good  Memory: WNL  Fund of knowledge:  Good  Insight:   Good  Judgment:  Good  Impulse Control: Good   Risk Assessment: Danger to Self:  No Self-injurious Behavior: No Danger to Others: No Duty to Warn:no Physical Aggression / Violence:No  Access to Firearms a concern: No  Gang Involvement:No   Subjective:   Angela Harrell participated from home, via video and consented to treatment. Therapist participated from home office. We met online due to [place] pandemic. Angela Harrell reviewed the events of the past week. She noted some disturbed sleep and noted this possibly due to the room temperature. She noted that Thanksgiving was stressful. She noted her father's continue decline due to dementia and his poor treatment of those around him, at times. She noted anxiety regarding her friends' recent cancer treatment. Angela Harrell noted her response to her father's behavior and her push to have her mother employ respite care. We explored her feelings during the session. Therapist validated and normalized Angela Harrell's feelings and experience and provided supportive therapy. Therapist encouraged Angela Harrell to continue work towards self-care and symptom management. Therapist praised Angela Harrell for her effort and provided supportive therapy.   Interventions: Interpersonal  Diagnosis:  Generalized anxiety disorder  PMDD  (premenstrual dysphoric disorder)  Psychiatric Treatment: Yes; Angela Lair, NP - [facility] .   Treatment Plan:  Client Abilities/Strengths Angela Harrell is intelligent, forthcoming, and motivated for change. She has responded well to treatment, overall.   Support System: Husband, friends, mother.   Client Treatment Preferences Outpatient Therapy.   Client Statement of Needs Angela Harrell would like to engage in self-care, engage in enjoyable activities, process past events and current interpersonal dynamics, manage overall symptoms, engage in self-care, and bolster coping skills.   Treatment Level Weekly  Symptoms Anxiety: Feeling nervous, difficulty managing anxiety, difficulty relaxing, restlessness, irritability, rumination   (Status: maintained) Depression: Loss of interest, feeling down, middle insomnia, lethargy, feeling bad about self, difficulty managing attention/concentration.   (Status: maintained)  Goals:  Angela Harrell experiences symptoms of depression and anxiety.   Target Date: 07/25/22 Frequency: Weekly  Progress: 10 Modality: individual    Therapist will provide referrals for additional resources as appropriate.  Therapist will provide psycho-education regarding Angela Harrell's diagnosis and corresponding treatment approaches and interventions. Licensed Clinical Social Worker, [place], [name] will support the patient's ability to achieve the goals identified. will employ CBT, BA, Problem-solving, Solution Focused, Mindfulness,  coping skills, & other evidenced-based practices will be used to promote progress towards healthy functioning to help manage decrease symptoms associated with her diagnosis.   Reduce overall level, frequency, and intensity of the feelings of depression and anxiety evidenced by decreased overall symptoms from 6 to 7 days/week to 0 to 1 days/week per client report for at least 3 consecutive months. Verbally express understanding of the  relationship between feelings of depression, anxiety and their impact on thinking patterns and behaviors. Verbalize an understanding of  the role that distorted thinking plays in creating fears, excessive worry, and ruminations. Meet with psychiatric provider.     Angela Harrell participated in the creation of the treatment plan)   [name], [name]

## 2022-05-30 DIAGNOSIS — D1801 Hemangioma of skin and subcutaneous tissue: Secondary | ICD-10-CM | POA: Diagnosis not present

## 2022-05-30 DIAGNOSIS — D2261 Melanocytic nevi of right upper limb, including shoulder: Secondary | ICD-10-CM | POA: Diagnosis not present

## 2022-05-30 DIAGNOSIS — L821 Other seborrheic keratosis: Secondary | ICD-10-CM | POA: Diagnosis not present

## 2022-05-30 DIAGNOSIS — L918 Other hypertrophic disorders of the skin: Secondary | ICD-10-CM | POA: Diagnosis not present

## 2022-05-30 DIAGNOSIS — D2262 Melanocytic nevi of left upper limb, including shoulder: Secondary | ICD-10-CM | POA: Diagnosis not present

## 2022-06-12 ENCOUNTER — Ambulatory Visit (INDEPENDENT_AMBULATORY_CARE_PROVIDER_SITE_OTHER): Payer: BC Managed Care – PPO | Admitting: Psychology

## 2022-06-12 DIAGNOSIS — F3281 Premenstrual dysphoric disorder: Secondary | ICD-10-CM | POA: Diagnosis not present

## 2022-06-12 DIAGNOSIS — F411 Generalized anxiety disorder: Secondary | ICD-10-CM

## 2022-06-12 NOTE — Progress Notes (Signed)
[place] Counselor/Therapist Progress Note  Patient ID: Angela Harrell, MRN: [id number]   Date: 06/12/22  Time Spent: 10:07 am - 11:01 am : 54 Minutes  Treatment Type: Individual Therapy.  Reported Symptoms: depression and anxiety.  Mental Status Exam: Appearance:  Neat and Well Groomed     Behavior: Appropriate  Motor: Normal  Speech/Language:  Clear and Coherent  Affect: Tearful  Mood: anxious  Thought process: normal  Thought content:   WNL  Sensory/Perceptual disturbances:   WNL  Orientation: oriented to person, place, time/date, and situation  Attention: Good  Concentration: Good  Memory: WNL  Fund of knowledge:  Good  Insight:   Good  Judgment:  Good  Impulse Control: Good   Risk Assessment: Danger to Self:  No Self-injurious Behavior: No Danger to Others: No Duty to Warn:no Physical Aggression / Violence:No  Access to Firearms a concern: No  Gang Involvement:No   Subjective:   Angela Harrell participated from home, via video and consented to treatment. Therapist participated from home office. We met online due to [place] pandemic. Angela Harrell reviewed the events of the past week. She noted experiencing stressors with her father and grand-mother. She noted feeling unappreciated by her grandmother due to grandmother's attitude and lack of acknowledgement of efforts. She noted feeling helplessness regarding her father's behavior and this being difficult to deal with due to their history. She noted the possibility of this being more manageable if their past relationship was more healthy. She noted her father and grandmother often being overly critical and noted the effect of this on her mood and being a reminder of negative experiences in childhood. She was tearful during the session. Therapist validated and noramlized Angela Harrell's feelings. We discussed boundary setting and assertiveness. Therapist praised Angela Harrell for her effort and provided supportive therapy.    Interventions: Interpersonal  Diagnosis:  Generalized anxiety disorder  PMDD (premenstrual dysphoric disorder)  Psychiatric Treatment: Yes; Angela Lair, NP - [facility].   Treatment Plan:  Client Abilities/Strengths Dameshia is intelligent, forthcoming, and motivated for change. She has responded well to treatment, overall.   Support System: Husband, friends, mother.   Client Treatment Preferences Outpatient Therapy.   Client Statement of Needs Tandra would like to engage in self-care, engage in enjoyable activities, process past events and current interpersonal dynamics, manage overall symptoms, engage in self-care, and bolster coping skills.   Treatment Level Weekly  Symptoms Anxiety: Feeling nervous, difficulty managing anxiety, difficulty relaxing, restlessness, irritability, rumination   (Status: maintained) Depression: Loss of interest, feeling down, middle insomnia, lethargy, feeling bad about self, difficulty managing attention/concentration.   (Status: maintained)  Goals:  Aloni experiences symptoms of depression and anxiety.   Target Date: 07/25/22 Frequency: Weekly  Progress: 10 Modality: individual    Therapist will provide referrals for additional resources as appropriate.  Therapist will provide psycho-education regarding Sharae's diagnosis and corresponding treatment approaches and interventions. Licensed Clinical Social Worker, [place], [name] will support the patient's ability to achieve the goals identified. will employ CBT, BA, Problem-solving, Solution Focused, Mindfulness,  coping skills, & other evidenced-based practices will be used to promote progress towards healthy functioning to help manage decrease symptoms associated with her diagnosis.   Reduce overall level, frequency, and intensity of the feelings of depression and anxiety evidenced by decreased overall symptoms from 6 to 7 days/week to 0 to 1 days/week per client report  for at least 3 consecutive months. Verbally express understanding of the relationship between feelings of depression, anxiety and their impact  on thinking patterns and behaviors. Verbalize an understanding of the role that distorted thinking plays in creating fears, excessive worry, and ruminations. Meet with psychiatric provider.     Angela Harrell participated in the creation of the treatment plan)   [name], [name]

## 2022-06-26 ENCOUNTER — Ambulatory Visit: Payer: BC Managed Care – PPO | Admitting: Psychology

## 2022-07-08 ENCOUNTER — Encounter: Payer: Self-pay | Admitting: Adult Health

## 2022-07-08 ENCOUNTER — Ambulatory Visit: Payer: BC Managed Care – PPO | Admitting: Adult Health

## 2022-07-08 DIAGNOSIS — F3281 Premenstrual dysphoric disorder: Secondary | ICD-10-CM | POA: Diagnosis not present

## 2022-07-08 DIAGNOSIS — F411 Generalized anxiety disorder: Secondary | ICD-10-CM

## 2022-07-08 MED ORDER — SERTRALINE HCL 50 MG PO TABS: 50.0000 mg | ORAL_TABLET | Freq: Every day | ORAL | 3 refills | Status: DC

## 2022-07-08 NOTE — Progress Notes (Signed)
Angela Harrell [id number] 1976/05/22 47 y.o.  Subjective:   Patient ID:  Angela Harrell is a 47 y.o. (DOB 1976/06/15) female.  Chief Complaint: No chief complaint on file.   Angela Harrell presents to the office today for follow-up of PMDD and GAD.  Describes mood today as "ok". Pleasant. Decreased tearfulness - appropritate. Mood symptoms - reports decreased depression, anxiety, and irritability - "moments of blah". Reports decreased worry and rumination. Mood is consistent. Stating "I'm feeling much calmer". Feels like the Zoloft continues to work well. Family doing well. Seeing therapist - Angela Harrell. Improved interest and motivation. Taking current medications as prescribed.  Energy levels improved. Active, exercising - walking - wall exercises.  Enjoys some usual interests and activities. Married 23 years - 3 children. Parents local - grandmother. Spending time with family. Appetite adequate. Weight stable - 190's pounds. Sleeps well most nights. Averages 6 hours. Napping some during the day - depends on the day. Focus and concentration improving. Completing tasks. Managing aspects of household. 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 Denies SI or HI.  Denies AH or VH. Denies self harm. Denies substance use.  History of seasonal depression.  Previous medication trials: Celexa '10mg'$  daily   PHQ2-9    [facility] Office Visit from 05/22/2021 in [facility] at [place] from 07/17/2018 in [facility] from 03/18/2018 in [facility] at [place] from 11/09/2014 in [place]  PHQ-2 Total Score 0 '2 3 1  '$ PHQ-9 Total Score '3 6 8 '$ --        Review of Systems:  Review of Systems  Musculoskeletal:  Negative for gait problem.  Neurological:  Negative for tremors.  Psychiatric/Behavioral:         Please refer to Angela    Medications: I have reviewed the patient's current  medications.  Current Outpatient Medications  Medication Sig Dispense Refill   albuterol (PROVENTIL HFA) 108 (90 Base) MCG/ACT inhaler Inhale 2 puffs into the lungs every 6 (six) hours as needed for wheezing or shortness of breath. 1 Inhaler 3   cetirizine (ZYRTEC) 10 MG tablet Take 10 mg by mouth daily.     Cholecalciferol (VITAMIN D3 PO) Take 2,000 mg by mouth daily.      LUTEIN PO Take 1 capsule by mouth daily.     Multiple Vitamins-Minerals (MULTIVITAMIN WITH MINERALS) tablet Take 1 tablet by mouth daily.     Omega-3 Fatty Acids (FISH OIL) 1000 MG CAPS Take by mouth.     omeprazole (PRILOSEC) 20 MG capsule Take 1 capsule (20 mg total) by mouth daily before breakfast. (Patient not taking: Reported on 05/22/2021) 30 capsule 1   Probiotic Product (PROBIOTIC PO) Take 1 tablet by mouth every other day.     PSYLLIUM HUSK PO Take 2 capsules by mouth daily.     rosuvastatin (CRESTOR) 10 MG tablet TAKE 1 TABLET BY MOUTH EVERY DAY 90 tablet 1   sertraline (ZOLOFT) 50 MG tablet Take 1 tablet (50 mg total) by mouth daily. 90 tablet 3   SPIRULINA PO Take 1 tablet by mouth daily. For food allergy prevention     Triamcinolone Acetonide (TRIAMCINOLONE 0.1 % CREAM : EUCERIN) CREA Apply 1 application topically 2 (two) times daily as needed for rash or itching. 500 each 1   Turmeric Curcumin 500 MG CAPS Take by mouth.     No current facility-administered medications for this visit.    Medication Side Effects: None  Allergies:  Allergies  Allergen Reactions   Beef-Derived Products Diarrhea    Nausea and vomiting   Prochlorperazine Edisylate Shortness Of Breath    Compazine Tongue swelling   Prochlorperazine Edisylate Swelling    Swelling of throat   Tree Extract Anaphylaxis    Throat itchy, roof of mouth gets raw   Banana Itching and Nausea And Vomiting    Throat gets itchy   Beef (Bovine) Protein     Diarrhea and stomach cramps    Oxycodone-Acetaminophen     Vomiting   Permethrin Rash     Rash and fever   Statins Itching   Morphine And Related Itching   Other     Tree Nuts (can have peanuts and cashews) Pyrethrine (ingredient in lice products)- fever and rash   Oxycodone-Acetaminophen Nausea And Vomiting   Latex Hives and Rash    Rash    Past Medical History:  Diagnosis Date   ALLERGIC RHINITIS 05/31/2008   Anemia    ANXIETY 05/31/2008   ASTHMA 05/31/2008   Asthma    CHOLELITHIASIS 05/31/2008   High blood pressure    readings   History of kidney stones    History of right breast biopsy 2017   HYPERLIPIDEMIA 05/31/2008   Kidney stone    NEPHROLITHIASIS, HX OF 05/31/2008    Past Medical History, Surgical history, Social history, and Family history were reviewed and updated as appropriate.   Please see review of systems for further details on the patient's review from today.   Objective:   Physical Exam:  There were no vitals taken for this visit.  Physical Exam Constitutional:      General: She is not in acute distress. Musculoskeletal:        General: No deformity.  Neurological:     Mental Status: She is alert and oriented to person, place, and time.     Coordination: Coordination normal.  Psychiatric:        Attention and Perception: Attention and perception normal. She does not perceive auditory or visual hallucinations.        Mood and Affect: Mood normal. Mood is not anxious or depressed. Affect is not labile, blunt, angry or inappropriate.        Speech: Speech normal.        Behavior: Behavior normal.        Thought Content: Thought content normal. Thought content is not paranoid or delusional. Thought content does not include homicidal or suicidal ideation. Thought content does not include homicidal or suicidal plan.        Cognition and Memory: Cognition and memory normal.        Judgment: Judgment normal.     Comments: Insight intact     Lab Review:     Component Value Date/Time   NA 135 05/22/2021 1142   K 4.3 05/22/2021 1142   CL 99  05/22/2021 1142   CO2 29 05/22/2021 1142   GLUCOSE 95 05/22/2021 1142   BUN 13 05/22/2021 1142   CREATININE 0.84 05/22/2021 1142   CALCIUM 9.7 05/22/2021 1142   PROT 7.6 05/22/2021 1142   ALBUMIN 4.5 05/22/2021 1142   AST 34 05/22/2021 1142   ALT 45 (H) 05/22/2021 1142   ALKPHOS 65 05/22/2021 1142   BILITOT 0.5 05/22/2021 1142   GFRNONAA 53 (L) 03/30/2017 0550   GFRAA >60 03/30/2017 0550       Component Value Date/Time   WBC 8.2 03/18/2018 1036   RBC 4.61 03/18/2018 1036   HGB 13.3  03/18/2018 1036   HCT 39.7 03/18/2018 1036   PLT 302.0 03/18/2018 1036   MCV 86.2 03/18/2018 1036   MCH 28.4 03/30/2017 0550   MCHC 33.5 03/18/2018 1036   RDW 14.0 03/18/2018 1036   LYMPHSABS 2.9 11/09/2014 0930   MONOABS 0.5 11/09/2014 0930   EOSABS 0.4 11/09/2014 0930   BASOSABS 0.0 11/09/2014 0930    No results found for: "POCLITH", "LITHIUM"   No results found for: "PHENYTOIN", "PHENOBARB", "VALPROATE", "CBMZ"   .res Assessment: Plan:    Plan:  PDMP reviewed  Zoloft '50mg'$  daily  PMDD screening tool completed initial session.  Considering ADD testing.  Time spent with patient was 15 minutes. Greater than 50% of face to face time with patient was spent on counseling and coordination of care.   RTC 6 months  Patient advised to contact office with any questions, adverse effects, or acute worsening in signs and symptoms.  Diagnoses and all orders for this visit:  Generalized anxiety disorder -     sertraline (ZOLOFT) 50 MG tablet; Take 1 tablet (50 mg total) by mouth daily.  PMDD (premenstrual dysphoric disorder) -     sertraline (ZOLOFT) 50 MG tablet; Take 1 tablet (50 mg total) by mouth daily.     Please see After Visit Summary for patient specific instructions.  Future Appointments  Date Time Provider [place]  07/10/2022 10:00 AM [name], [name] [facility] None  07/24/2022 10:00 AM [name], [name] [facility] None  08/07/2022 10:00 AM [name],  [name] [facility] None  08/21/2022 10:00 AM [name], [name] [facility] None  09/04/2022 10:00 AM [name], [name] [facility] None  09/18/2022 10:00 AM [name], [name] [facility] None  10/02/2022 10:00 AM [name], [name] [facility] None  10/16/2022 10:00 AM [name], [name] [facility] None  10/30/2022 10:00 AM [name], [name] [facility] None  11/13/2022 10:00 AM [name], [name] [facility] None  11/27/2022 10:00 AM [name], [name] [facility] None  12/11/2022 10:00 AM [name], [name] [facility] None  12/25/2022 10:00 AM [name], [name] [facility] None  01/08/2023 10:00 AM [name], [name] [facility] None  01/22/2023 10:00 AM [name], [name] [facility] None    No orders of the defined types were placed in this encounter.   -------------------------------

## 2022-07-10 ENCOUNTER — Ambulatory Visit (INDEPENDENT_AMBULATORY_CARE_PROVIDER_SITE_OTHER): Payer: BC Managed Care – PPO | Admitting: Psychology

## 2022-07-10 DIAGNOSIS — F411 Generalized anxiety disorder: Secondary | ICD-10-CM

## 2022-07-10 DIAGNOSIS — F3281 Premenstrual dysphoric disorder: Secondary | ICD-10-CM

## 2022-07-10 NOTE — Progress Notes (Signed)
[facility] Counselor/Therapist Progress Note  Patient ID: Angela Harrell, MRN: [id number]   Date: 07/10/22  Time Spent: 10:04 am - 11:01 am : 57 Minutes  Treatment Type: Individual Therapy.  Reported Symptoms: depression and anxiety.  Mental Status Exam: Appearance:  Neat and Well Groomed     Behavior: Appropriate  Motor: Normal  Speech/Language:  Clear and Coherent  Affect: Tearful  Mood: anxious  Thought process: normal  Thought content:   WNL  Sensory/Perceptual disturbances:   WNL  Orientation: oriented to person, place, time/date, and situation  Attention: Good  Concentration: Good  Memory: WNL  Fund of knowledge:  Good  Insight:   Good  Judgment:  Good  Impulse Control: Good   Risk Assessment: Danger to Self:  No Self-injurious Behavior: No Danger to Others: No Duty to Warn:no Physical Aggression / Violence:No  Access to Firearms a concern: No  Gang Involvement:No   Subjective:   Angela Harrell participated from home, via video and consented to treatment. Therapist participated from home office. We met online due to [place] pandemic. Angela Harrell reviewed the events of the past week. Angela Harrell noted poor sleep and attributed this her cycle. She noted stress due to the school delays and the shift in schedule. She noted the balance needed to maintain parental responsibility day to day. She noted feeling frustration for repeated instructions or details for her husband which resulted in Angela Harrell becoming frustrated as well.  She noted conversation between her and Angela Harrell regarding couple's counseling and therapist supported this. We continued to process her father's cognitive decline and his reversion to being antagonistic and biting with his communication. Therapist encouraged Angela Harrell to identify ways to build and maintain day-to-day balance. She noted her previous attempts to manage this balance in the past. She noted feelings of guilt for taking time for self. She noted an  interest in becoming more physically active. Therapist encouraged Angela Harrell to identify time, during this week, to spend time with self and be mindful of how she feels regarding investing in self. Therapist validated and noramlized Angela Harrell's feelings. Therapist praised Angela Harrell for her effort and provided supportive therapy.   Interventions:  BA and CBT  Diagnosis:  Generalized anxiety disorder  PMDD (premenstrual dysphoric disorder)  Psychiatric Treatment: Yes; Angela Lair, NP - [facility].   Treatment Plan:  Client Abilities/Strengths Angela Harrell is intelligent, forthcoming, and motivated for change. She has responded well to treatment, overall.   Support System: Husband, friends, mother.   Client Treatment Preferences Outpatient Therapy.   Client Statement of Needs Angela Harrell would like to engage in self-care, engage in enjoyable activities, process past events and current interpersonal dynamics, manage overall symptoms, engage in self-care, and bolster coping skills.   Treatment Level Weekly  Symptoms Anxiety: Feeling nervous, difficulty managing anxiety, difficulty relaxing, restlessness, irritability, rumination   (Status: maintained) Depression: Loss of interest, feeling down, middle insomnia, lethargy, feeling bad about self, difficulty managing attention/concentration.   (Status: maintained)  Goals:  Shya experiences symptoms of depression and anxiety.   Target Date: 07/25/22 Frequency: Weekly  Progress: 10 Modality: individual    Therapist will provide referrals for additional resources as appropriate.  Therapist will provide psycho-education regarding Angela Harrell's diagnosis and corresponding treatment approaches and interventions. Licensed Clinical Social Worker, [place], [name] will support the patient's ability to achieve the goals identified. will employ CBT, BA, Problem-solving, Solution Focused, Mindfulness,  coping skills, & other evidenced-based practices  will be used to promote progress towards healthy functioning to help manage decrease  symptoms associated with her diagnosis.   Reduce overall level, frequency, and intensity of the feelings of depression and anxiety evidenced by decreased overall symptoms from 6 to 7 days/week to 0 to 1 days/week per client report for at least 3 consecutive months. Verbally express understanding of the relationship between feelings of depression, anxiety and their impact on thinking patterns and behaviors. Verbalize an understanding of the role that distorted thinking plays in creating fears, excessive worry, and ruminations. Meet with psychiatric provider.     Angela Harrell participated in the creation of the treatment plan)   [name], [name]

## 2022-07-24 ENCOUNTER — Ambulatory Visit: Payer: BC Managed Care – PPO | Admitting: Psychology

## 2022-07-24 DIAGNOSIS — F3281 Premenstrual dysphoric disorder: Secondary | ICD-10-CM

## 2022-07-24 DIAGNOSIS — F411 Generalized anxiety disorder: Secondary | ICD-10-CM

## 2022-07-24 NOTE — Progress Notes (Signed)
[facility] Counselor/Therapist Progress Note  Patient ID: Angela Harrell, MRN: [id number]   Date: 07/24/22  Time Spent: 10:05 am - 11:01 am : 56 Minutes  Treatment Type: Individual Therapy.  Reported Symptoms: depression and anxiety.  Mental Status Exam: Appearance:  Neat and Well Groomed     Behavior: Appropriate  Motor: Normal  Speech/Language:  Clear and Coherent  Affect: Appropriate  Mood: normal  Thought process: normal  Thought content:   WNL  Sensory/Perceptual disturbances:   WNL  Orientation: oriented to person, place, time/date, and situation  Attention: Good  Concentration: Good  Memory: WNL  Fund of knowledge:  Good  Insight:   Good  Judgment:  Good  Impulse Control: Good   Risk Assessment: Danger to Self:  No Self-injurious Behavior: No Danger to Others: No Duty to Warn:no Physical Aggression / Violence:No  Access to Firearms a concern: No  Gang Involvement:No   Subjective:   Angela Harrell participated from home, via video and consented to treatment. Therapist participated from home office. We met online due to [place] pandemic. Angela Harrell reviewed the events of the past week. She noted work related stressors and her effort to become more proactive. She noted her husband needing a screening for ADHD and her daughter, Landry Dyke, recently being diagnosed with ADHD. Angela Harrell would benefit from an ADHD screening as well, which was previously discussed, and is under consideration. She noted her husband's frustration regarding his own memory and noted this creating marital stressors. We explored this during the session and therapist challenged Angela Harrell's jump to conclusion regarding Angela Harrell's possible motivation for not paying attention. She discussed her own interest  in getting assessed. Therapist encouraged follow-up with her psychiatric provider, Angela Harrell - NP  with [facility]. She noted working on managing interpersonal stressors with other volunteers.  She noted spending time on reading and watching a new tv show and managing her negative self-talk in relation to. She noted often feeling "guilty when not being productive". She noted her father instilling the idea that "sitting down is being lazy". She noted her work to challenge past critical messages from family and working on taking inventory of feedback from those who she spends time with now. We will continue to explore this along with her father's antithetical behavior. Therapist validated and noramlized Angela Harrell's feelings. Therapist praised Angela Harrell for her effort and provided supportive therapy.   Interventions: Cognitive Behavioral Therapy and Interpersonal  Diagnosis:  Generalized anxiety disorder  PMDD (premenstrual dysphoric disorder)  Psychiatric Treatment: Yes; Angela Lair, NP - [facility].   Treatment Plan:  Client Abilities/Strengths Angela Harrell is intelligent, forthcoming, and motivated for change. She has responded well to treatment, overall.   Support System: Husband, friends, mother.   Client Treatment Preferences Outpatient Therapy.   Client Statement of Needs Angela Harrell would like to engage in self-care, engage in enjoyable activities, process past events and current interpersonal dynamics, manage overall symptoms, engage in self-care, and bolster coping skills.   Treatment Level Weekly  Symptoms Anxiety: Feeling nervous, difficulty managing anxiety, difficulty relaxing, restlessness, irritability, rumination   (Status: maintained) Depression: Loss of interest, feeling down, middle insomnia, lethargy, feeling bad about self, difficulty managing attention/concentration.   (Status: maintained)  Goals:  Angela Harrell experiences symptoms of depression and anxiety.   Target Date: 07/25/22 Frequency: Weekly  Progress: 10 Modality: individual    Therapist will provide referrals for additional resources as appropriate.  Therapist will provide psycho-education  regarding Angela Harrell's diagnosis and corresponding treatment approaches and interventions. Licensed Clinical Social  Worker, [name], [name] will support the patient's ability to achieve the goals identified. will employ CBT, BA, Problem-solving, Solution Focused, Mindfulness,  coping skills, & other evidenced-based practices will be used to promote progress towards healthy functioning to help manage decrease symptoms associated with her diagnosis.   Reduce overall level, frequency, and intensity of the feelings of depression and anxiety evidenced by decreased overall symptoms from 6 to 7 days/week to 0 to 1 days/week per client report for at least 3 consecutive months. Verbally express understanding of the relationship between feelings of depression, anxiety and their impact on thinking patterns and behaviors. Verbalize an understanding of the role that distorted thinking plays in creating fears, excessive worry, and ruminations. Meet with psychiatric provider.     Angela Harrell participated in the creation of the treatment plan)   [name], [name]

## 2022-08-07 ENCOUNTER — Ambulatory Visit (INDEPENDENT_AMBULATORY_CARE_PROVIDER_SITE_OTHER): Payer: BC Managed Care – PPO | Admitting: Psychology

## 2022-08-07 DIAGNOSIS — F3281 Premenstrual dysphoric disorder: Secondary | ICD-10-CM

## 2022-08-07 DIAGNOSIS — F411 Generalized anxiety disorder: Secondary | ICD-10-CM | POA: Diagnosis not present

## 2022-08-07 NOTE — Progress Notes (Signed)
[facility] Counselor/Therapist Progress Note  Patient ID: Angela Harrell, MRN: [id number]   Date: 08/07/22  Time Spent: 10:03 am -11:00 am : 57 Minutes  Treatment Type: Individual Therapy.  Reported Symptoms: depression and anxiety.  Mental Status Exam: Appearance:  Neat and Well Groomed     Behavior: Appropriate  Motor: Normal  Speech/Language:  Clear and Coherent  Affect: Appropriate  Mood: normal  Thought process: normal  Thought content:   WNL  Sensory/Perceptual disturbances:   WNL  Orientation: oriented to person, place, time/date, and situation  Attention: Good  Concentration: Good  Memory: WNL  Fund of knowledge:  Good  Insight:   Good  Judgment:  Good  Impulse Control: Good   Risk Assessment: Danger to Self:  No Self-injurious Behavior: No Danger to Others: No Duty to Warn:no Physical Aggression / Violence:No  Access to Firearms a concern: No  Gang Involvement:No   Subjective:   Angela Harrell participated from home, via video and consented to treatment. Therapist participated from home office. We met online due to [place] pandemic. Angela Harrell reviewed the events of the past week and we processed this during the session. 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 She noted she and Angela Harrell working on improving communication in relation to attention and mindfulness. She noted often assuming that work related contact is negative. She noted working on not assuming negatively. She noted working on setting boundaries for self to check work email during off hours. She noted frustration regarding other people's decision-making. We processed this during the session. She noted difficulty with not having anonymity and noted wanting this in childhood to not be associated with her brothers' or their behavior.  She noted her anonymity is a mechanism to safeguard self. She noted being afraid of people displacing anger towards her or treating her poorly, overall. We discussed  boundaries in relation to negative interaction and assertiveness going forward. Therapist validated and noramlized Angela Harrell's feelings. Therapist praised Angela Harrell for her effort and provided supportive therapy.   Interventions: Cognitive Behavioral Therapy and Interpersonal  Diagnosis:  Generalized anxiety disorder  PMDD (premenstrual dysphoric disorder)  Psychiatric Treatment: Yes; Angela Lair, NP - [facility].   Treatment Plan:  Client Abilities/Strengths Angela Harrell is intelligent, forthcoming, and motivated for change. She has responded well to treatment, overall.   Support System: Husband, friends, mother.   Client Treatment Preferences Outpatient Therapy.   Client Statement of Needs Angela Harrell would like to engage in self-care, engage in enjoyable activities, process past events and current interpersonal dynamics, manage overall symptoms, engage in self-care, and bolster coping skills.   Treatment Level Weekly  Symptoms Anxiety: Feeling nervous, difficulty managing anxiety, difficulty relaxing, restlessness, irritability, rumination   (Status: maintained) Depression: Loss of interest, feeling down, middle insomnia, lethargy, feeling bad about self, difficulty managing attention/concentration.   (Status: maintained)  Goals:  Angela Harrell experiences symptoms of depression and anxiety.   Target Date: 08/25/22 Frequency: Weekly  Progress: 10 Modality: individual    Therapist will provide referrals for additional resources as appropriate.  Therapist will provide psycho-education regarding Angela Harrell's diagnosis and corresponding treatment approaches and interventions. Licensed Clinical Social Worker, [place], [name] will support the patient's ability to achieve the goals identified. will employ CBT, BA, Problem-solving, Solution Focused, Mindfulness,  coping skills, & other evidenced-based practices will be used to promote progress towards healthy functioning to help manage  decrease symptoms associated with her diagnosis.   Reduce overall level, frequency, and intensity of the feelings of depression and anxiety evidenced  by decreased overall symptoms from 6 to 7 days/week to 0 to 1 days/week per client report for at least 3 consecutive months. Verbally express understanding of the relationship between feelings of depression, anxiety and their impact on thinking patterns and behaviors. Verbalize an understanding of the role that distorted thinking plays in creating fears, excessive worry, and ruminations. Meet with psychiatric provider.     Angela Harrell participated in the creation of the treatment plan)   [name], [name]

## 2022-08-21 ENCOUNTER — Ambulatory Visit (INDEPENDENT_AMBULATORY_CARE_PROVIDER_SITE_OTHER): Payer: BC Managed Care – PPO | Admitting: Psychology

## 2022-08-21 DIAGNOSIS — F411 Generalized anxiety disorder: Secondary | ICD-10-CM | POA: Diagnosis not present

## 2022-08-21 DIAGNOSIS — F3281 Premenstrual dysphoric disorder: Secondary | ICD-10-CM

## 2022-08-21 NOTE — Progress Notes (Signed)
[place] Counselor/Therapist Progress Note  Patient ID: Angela Harrell, MRN: [id number]   Date: 08/21/22  Time Spent: 10:04 am - 11:02 am : 40 Minutes  Treatment Type: Individual Therapy.  Reported Symptoms: depression and anxiety.  Mental Status Exam: Appearance:  Neat and Well Groomed     Behavior: Appropriate  Motor: Normal  Speech/Language:  Clear and Coherent  Affect: Appropriate  Mood: normal  Thought process: normal  Thought content:   WNL  Sensory/Perceptual disturbances:   WNL  Orientation: oriented to person, place, time/date, and situation  Attention: Good  Concentration: Good  Memory: WNL  Fund of knowledge:  Good  Insight:   Good  Judgment:  Good  Impulse Control: Good   Risk Assessment: Danger to Self:  No Self-injurious Behavior: No Danger to Others: No Duty to Warn:no Physical Aggression / Violence:No  Access to Firearms a concern: No  Gang Involvement:No   Subjective:   Angela Harrell participated from home, via video and consented to treatment. Therapist participated from home office. We met online due to [place] pandemic. Angela Harrell reviewed the events of the past week. She noted her adjustment to her children beginning to date. She noted experiencing work interruptions due her husband working from home. We worked on identifying ways to set boundaries and communicate clearly and explicitly. She noted her father's worsening memory and inability to remember significant life events. We worked on processing this during the session. She discussed often feeling overwhelmed by her children's friend's disclosure of personal information and a need for boundaries. We explored this during the session and began working identifying boundaries. She noted continued difficulty with sleep and noted her attempts to increase exercise, limit liquid consumption at night, and generally improving her sleep. She noted having often to take a nap in the middle of the day. She  noted being less motivated and discussed her previous anxiety often being a motivator for engagement/action. We explored this and therapist encouraged Angela Harrell to discuss concerns with her prescriber regarding sleep and motivation. We revisited the topic of an ADHD assessment due to a previous positive screening, the ASRS V1.1. Therapist validated and noramlized Angela Harrell's feelings. Therapist praised Angela Harrell for her effort and provided supportive therapy.   Interventions: Cognitive Behavioral Therapy and Interpersonal  Diagnosis:  Generalized anxiety disorder  PMDD (premenstrual dysphoric disorder)  Psychiatric Treatment: Yes; Angela Lair, NP - [facility].   Treatment Plan:  Client Abilities/Strengths Angela Harrell is intelligent, forthcoming, and motivated for change. She has responded well to treatment, overall.   Support System: Husband, friends, mother.   Client Treatment Preferences Outpatient Therapy.   Client Statement of Needs Angela Harrell would like to engage in self-care, engage in enjoyable activities, process past events and current interpersonal dynamics, manage overall symptoms, engage in self-care, and bolster coping skills.   Treatment Level Weekly  Symptoms Anxiety: Feeling nervous, difficulty managing anxiety, difficulty relaxing, restlessness, irritability, rumination   (Status: maintained) Depression: Loss of interest, feeling down, middle insomnia, lethargy, feeling bad about self, difficulty managing attention/concentration.   (Status: maintained)  Goals:  Angela Harrell experiences symptoms of depression and anxiety.   Target Date: 08/25/22 Frequency: Weekly  Progress: 10 Modality: individual    Therapist will provide referrals for additional resources as appropriate.  Therapist will provide psycho-education regarding Angela Harrell diagnosis and corresponding treatment approaches and interventions. Licensed Clinical Social Worker, [place], [name] will support the  patient's ability to achieve the goals identified. will employ CBT, BA, Problem-solving, Solution Focused, Mindfulness,  coping skills, &  other evidenced-based practices will be used to promote progress towards healthy functioning to help manage decrease symptoms associated with her diagnosis.   Reduce overall level, frequency, and intensity of the feelings of depression and anxiety evidenced by decreased overall symptoms from 6 to 7 days/week to 0 to 1 days/week per client report for at least 3 consecutive months. Verbally express understanding of the relationship between feelings of depression, anxiety and their impact on thinking patterns and behaviors. Verbalize an understanding of the role that distorted thinking plays in creating fears, excessive worry, and ruminations. Meet with psychiatric provider.     Angela Harrell participated in the creation of the treatment plan)   [name], [name]

## 2022-09-04 ENCOUNTER — Ambulatory Visit (INDEPENDENT_AMBULATORY_CARE_PROVIDER_SITE_OTHER): Payer: BC Managed Care – PPO | Admitting: Psychology

## 2022-09-04 DIAGNOSIS — F411 Generalized anxiety disorder: Secondary | ICD-10-CM | POA: Diagnosis not present

## 2022-09-04 DIAGNOSIS — F3281 Premenstrual dysphoric disorder: Secondary | ICD-10-CM | POA: Diagnosis not present
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
## 2022-09-18 ENCOUNTER — Ambulatory Visit (INDEPENDENT_AMBULATORY_CARE_PROVIDER_SITE_OTHER): Payer: BC Managed Care – PPO | Admitting: Psychology

## 2022-09-18 DIAGNOSIS — F3281 Premenstrual dysphoric disorder: Secondary | ICD-10-CM | POA: Diagnosis not present

## 2022-09-18 DIAGNOSIS — F411 Generalized anxiety disorder: Secondary | ICD-10-CM | POA: Diagnosis not present

## 2022-09-18 NOTE — Progress Notes (Signed)
[place] Counselor/Therapist Progress Note  Patient ID: Angela Harrell, MRN: [id number]   Date: 09/18/22  Time Spent: 10:04  am - 11:02 am : 21 Minutes  Treatment Type: Individual Therapy.  Reported Symptoms: depression and anxiety.   Mental Status Exam: Appearance:  Casual     Behavior: Appropriate  Motor: Normal  Speech/Language:  Clear and Coherent  Affect: Congruent  Mood: normal  Thought process: normal  Thought content:   WNL  Sensory/Perceptual disturbances:   WNL  Orientation: oriented to person, place, time/date, and situation  Attention: Good  Concentration: Good  Memory: WNL  Fund of knowledge:  Good  Insight:   Good  Judgment:  Good  Impulse Control: Good   Risk Assessment: Danger to Self:  No Self-injurious Behavior: No Danger to Others: No Duty to Warn:no Physical Aggression / Violence:No  Access to Firearms a concern: No  Gang Involvement:No   Subjective:   Angela Harrell participated from home, via video and consented to treatment. Therapist participated from home office. We met online due to [place] pandemic. Angela Harrell reviewed the events of the past week. We reviewed numerous treatment approaches including CBT, BA, Problem Solving, and Solution focused therapy. Psych-education regarding the Angela Harrell's diagnosis of Generalized anxiety disorder  PMDD (premenstrual dysphoric disorder) was provided during the session. We discussed Verbal [place] goals treatment goals which include manage day-to-day stressors, process past events, process her father's cognitive decline and behavior in public,  being less avoidant, manage family stressors, improve motivation and organization, spend more time outside, and manage symptoms. Angela Harrell provided verbal approval of the treatment plan.   Interventions: Psycho-education & Goal Setting.   Diagnosis:  Generalized anxiety disorder  PMDD (premenstrual dysphoric disorder)  Psychiatric Treatment: Yes , [place]. See chart for meds.    Treatment Plan:  Client Abilities/Strengths Angela Harrell is self-aware and motivated for change.   Support System: Family and friends.   Client Treatment Preferences Outpatient Therapy.   Client Statement of Needs Angela Harrell would like to manage day-to-day stressors, process past events, process her father's cognitive decline and behavior in public,  being less avoidant, manage family stressors, improve motivation and organization, spend more time outside, and manage symptoms.   Treatment Level Weekly  Symptoms  Depression: Loss of interest, fluctuating sleep, lethargy, feeling bad about self, and difficulty concentrating.    (Status: maintained) Anxiety: Anxious, trouble relaxing, and irritability.    (Status: maintained)  Goals:   Angela Harrell experiences symptoms of anxiety and depression   Target Date: 09/18/23 Frequency: Weekly  Progress: 0 Modality: individual    Therapist will provide referrals for additional resources as appropriate.  Therapist will provide psycho-education regarding Angela Harrell's diagnosis and corresponding treatment approaches and interventions. Licensed Clinical Social Worker, [place], [name] will support the patient's ability to achieve the goals identified. will employ CBT, BA, Problem-solving, Solution Focused, Mindfulness,  coping skills, & other evidenced-based practices will be used to promote progress towards healthy functioning to help manage decrease symptoms associated with her diagnosis.   Reduce overall level, frequency, and intensity of the feelings of depression, anxiety and panic evidenced by decreased overall symptoms from 6 to 7 days/week to 0 to 1 days/week per client report for at least 3 consecutive months. Verbally express understanding of the relationship between feelings of depression, anxiety and their impact on thinking patterns and behaviors. Verbalize an understanding of the role that  distorted thinking plays in creating fears, excessive worry, and ruminations.    Angela Harrell  participated in the creation of the treatment plan)    [name], [name]

## 2022-09-29 ENCOUNTER — Other Ambulatory Visit: Payer: Self-pay | Admitting: Family Medicine

## 2022-10-02 ENCOUNTER — Ambulatory Visit (INDEPENDENT_AMBULATORY_CARE_PROVIDER_SITE_OTHER): Payer: BC Managed Care – PPO | Admitting: Psychology

## 2022-10-02 DIAGNOSIS — F3281 Premenstrual dysphoric disorder: Secondary | ICD-10-CM | POA: Diagnosis not present

## 2022-10-02 DIAGNOSIS — F411 Generalized anxiety disorder: Secondary | ICD-10-CM

## 2022-10-02 NOTE — Progress Notes (Signed)
[place] Counselor/Therapist Progress Note  Patient ID: Angela Harrell, MRN: [id number]   Date: 10/02/22  Time Spent: 10:04  am - 11:01am : 57 Minutes  Treatment Type: Individual Therapy.  Reported Symptoms: depression and anxiety.   Mental Status Exam: Appearance:  Casual     Behavior: Appropriate  Motor: Normal  Speech/Language:  Clear and Coherent  Affect: Congruent  Mood: normal  Thought process: normal  Thought content:   WNL  Sensory/Perceptual disturbances:   WNL  Orientation: oriented to person, place, time/date, and situation  Attention: Good  Concentration: Good  Memory: WNL  Fund of knowledge:  Good  Insight:   Good  Judgment:  Good  Impulse Control: Good   Risk Assessment: Danger to Self:  No Self-injurious Behavior: No Danger to Others: No Duty to Warn:no Physical Aggression / Violence:No  Access to Firearms a concern: No  Gang Involvement:No   Subjective:   Angela Harrell participated from home, via video and consented to treatment. Therapist participated from home office. We met online due to [place] pandemic. Angela Harrell reviewed the events of the past week. She noted upcoming transitions. She noted her husband's need to get assessed for ADHD and the effect of his inattention on her. She noted having to balance the needs of all members of the family and how this affects her. 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 She noted continued frustration regarding her father and grandmother's behavior. She noted her attempts to to manage her frustration while maintaining a supportive approach. Therapist encouraged Angela Harrell to engage in enjoyable activities, solo activities, to meet her own needs. She noted increased exercise, particularly weight-lifting, which therapist praised. Therapist validated and normalized Angela Harrell's feelings and experience and provided supportive therapy.     Interventions:  Interpersonal  Harrell:  Generalized anxiety disorder  PMDD (premenstrual dysphoric disorder)  Psychiatric Treatment: Yes , [place]. See chart for meds.    Treatment Plan:  Client Abilities/Strengths Angela Harrell is self-aware and motivated for change.   Support System: Family and friends.   Client Treatment Preferences Outpatient Therapy.   Client Statement of Needs Angela Harrell would like to manage day-to-day stressors, process past events, process her father's cognitive decline and behavior in public,  being less avoidant, manage family stressors, improve motivation and organization, spend more time outside, and manage symptoms.   Treatment Level Weekly  Symptoms  Depression: Loss of interest, fluctuating sleep, lethargy, feeling bad about self, and difficulty concentrating.    (Status: maintained) Anxiety: Anxious, trouble relaxing, and irritability.    (Status: maintained)  Goals:   Angela Harrell experiences symptoms of anxiety and depression   Target Date: 09/18/23 Frequency: Weekly  Progress: 0 Modality: individual    Therapist will provide referrals for additional resources as appropriate.  Therapist will provide psycho-education regarding Angela Harrell and corresponding treatment approaches and interventions. Licensed Clinical Social Worker, [place], [name] will support the patient's ability to achieve the goals identified. will employ CBT, BA, Problem-solving, Solution Focused, Mindfulness,  coping skills, & other evidenced-based practices will be used to promote progress towards healthy functioning to help manage decrease symptoms associated with her Harrell.   Reduce overall level, frequency, and intensity of the feelings of depression, anxiety and panic evidenced by decreased overall symptoms from 6 to 7 days/week to 0 to 1 days/week per client report for at least 3 consecutive months. Verbally express understanding of the relationship  between feelings of depression, anxiety and their  impact on thinking patterns and behaviors. Verbalize an understanding of the role that distorted thinking plays in creating fears, excessive worry, and ruminations.    [name] participated in the creation of the treatment plan)    [name], [name]

## 2022-10-05 DIAGNOSIS — R7303 Prediabetes: Secondary | ICD-10-CM | POA: Insufficient documentation

## 2022-10-05 NOTE — Progress Notes (Unsigned)
HPI:  No chief complaint on file.   Angela Harrell is a 47 y.o. female, who is here today for chronic disease management. Last seen on 05/22/21.  Prediabetes: Negative for polyuria, polydipsia.  Lab Results  Component Value Date   HGBA1C 6.1 05/22/2021   Hyperlipidemia: Currently on *** Lab Results  Component Value Date   CHOL 190 05/22/2021   HDL 46.20 05/22/2021   LDLCALC 105 (H) 05/22/2021   LDLDIRECT 174.0 10/10/2020   TRIG 192.0 (H) 05/22/2021   CHOLHDL 4 05/22/2021   Vit D deficiency: ***  Review of Systems See other pertinent positives and negatives in HPI.  Current Outpatient Medications on File Prior to Visit  Medication Sig Dispense Refill   albuterol (PROVENTIL HFA) 108 (90 Base) MCG/ACT inhaler Inhale 2 puffs into the lungs every 6 (six) hours as needed for wheezing or shortness of breath. 1 Inhaler 3   cetirizine (ZYRTEC) 10 MG tablet Take 10 mg by mouth daily.     Cholecalciferol (VITAMIN D3 PO) Take 2,000 mg by mouth daily.      LUTEIN PO Take 1 capsule by mouth daily.     Multiple Vitamins-Minerals (MULTIVITAMIN WITH MINERALS) tablet Take 1 tablet by mouth daily.     Omega-3 Fatty Acids (FISH OIL) 1000 MG CAPS Take by mouth.     omeprazole (PRILOSEC) 20 MG capsule Take 1 capsule (20 mg total) by mouth daily before breakfast. (Patient not taking: Reported on 05/22/2021) 30 capsule 1   Probiotic Product (PROBIOTIC PO) Take 1 tablet by mouth every other day.     PSYLLIUM HUSK PO Take 2 capsules by mouth daily.     rosuvastatin (CRESTOR) 10 MG tablet Take 1 tablet (10 mg total) by mouth daily. Please call [phone] to schedule a follow up appointment. 30 tablet 0   sertraline (ZOLOFT) 50 MG tablet Take 1 tablet (50 mg total) by mouth daily. 90 tablet 3   SPIRULINA PO Take 1 tablet by mouth daily. For food allergy prevention     Triamcinolone Acetonide (TRIAMCINOLONE 0.1 % CREAM : EUCERIN) CREA Apply 1 application topically 2 (two) times daily as needed  for rash or itching. 500 each 1   Turmeric Curcumin 500 MG CAPS Take by mouth.     No current facility-administered medications on file prior to visit.    Past Medical History:  Diagnosis Date   ALLERGIC RHINITIS 05/31/2008   Anemia    ANXIETY 05/31/2008   ASTHMA 05/31/2008   Asthma    CHOLELITHIASIS 05/31/2008   High blood pressure    readings   History of kidney stones    History of right breast biopsy 2017   HYPERLIPIDEMIA 05/31/2008   Kidney stone    NEPHROLITHIASIS, HX OF 05/31/2008    Allergies  Allergen Reactions   Beef-Derived Products Diarrhea    Nausea and vomiting   Prochlorperazine Edisylate Shortness Of Breath    Compazine Tongue swelling   Prochlorperazine Edisylate Swelling    Swelling of throat   Tree Extract Anaphylaxis    Throat itchy, roof of mouth gets raw   Banana Itching and Nausea And Vomiting    Throat gets itchy   Beef (Bovine) Protein     Diarrhea and stomach cramps    Oxycodone-Acetaminophen     Vomiting   Permethrin Rash    Rash and fever   Statins Itching   Morphine And Related Itching   Other     Tree Nuts (can have peanuts and cashews) Pyrethrine (ingredient  in lice products)- fever and rash   Oxycodone-Acetaminophen Nausea And Vomiting   Latex Hives and Rash    Rash    Social History   Socioeconomic History   Marital status: Married    Spouse name: Not on file   Number of children: 3   Years of education: BS   Highest education level: Not on file  Occupational History   Occupation: Metallurgist   Occupation: Substitute  Tobacco Use   Smoking status: Never   Smokeless tobacco: Never  Substance and Sexual Activity   Alcohol use: No   Drug use: No   Sexual activity: Yes    Birth control/protection: Surgical  Other Topics Concern   Not on file  Social History Narrative   Lives with husband & 3 kids, work at home, PT outside of home. Often volunteers.   Social Determinants of Health   Financial Resource Strain: Not on file   Food Insecurity: Not on file  Transportation Needs: Not on file  Physical Activity: Not on file  Stress: Not on file  Social Connections: Not on file    There were no vitals filed for this visit.  There is no height or weight on file to calculate BMI.  Physical Exam  ASSESSMENT AND PLAN:  Diagnoses and all orders for this visit:  Prediabetes  Hyperlipemia, mixed  Anxiety disorder, unspecified type  Vitamin D deficiency    No orders of the defined types were placed in this encounter.   No problem-specific Assessment & Plan notes found for this encounter.   No follow-ups on file.   Madgie Dhaliwal [name], MD [facility]. [place] [facility].

## 2022-10-07 ENCOUNTER — Encounter: Payer: Self-pay | Admitting: Family Medicine

## 2022-10-07 ENCOUNTER — Ambulatory Visit: Payer: BC Managed Care – PPO | Admitting: Family Medicine

## 2022-10-07 VITALS — BP 128/80 | HR 82 | Temp 98.6°F | Resp 12 | Ht 63.0 in | Wt 203.5 lb

## 2022-10-07 DIAGNOSIS — F419 Anxiety disorder, unspecified: Secondary | ICD-10-CM

## 2022-10-07 DIAGNOSIS — E782 Mixed hyperlipidemia: Secondary | ICD-10-CM | POA: Diagnosis not present

## 2022-10-07 DIAGNOSIS — E559 Vitamin D deficiency, unspecified: Secondary | ICD-10-CM | POA: Diagnosis not present

## 2022-10-07 DIAGNOSIS — Z1211 Encounter for screening for malignant neoplasm of colon: Secondary | ICD-10-CM

## 2022-10-07 DIAGNOSIS — H9203 Otalgia, bilateral: Secondary | ICD-10-CM

## 2022-10-07 DIAGNOSIS — R7303 Prediabetes: Secondary | ICD-10-CM | POA: Diagnosis not present

## 2022-10-07 LAB — COMPREHENSIVE METABOLIC PANEL
ALT: 32 U/L (ref 0–35)
AST: 32 U/L (ref 0–37)
Albumin: 4.6 g/dL (ref 3.5–5.2)
Alkaline Phosphatase: 69 U/L (ref 39–117)
BUN: 13 mg/dL (ref 6–23)
CO2: 25 mEq/L (ref 19–32)
Calcium: 9.9 mg/dL (ref 8.4–10.5)
Chloride: 102 mEq/L (ref 96–112)
Creatinine, Ser: 0.81 mg/dL (ref 0.40–1.20)
GFR: 87.38 mL/min (ref >60.00)
Glucose, Bld: 115 mg/dL — ABNORMAL HIGH (ref 70–99)
Potassium: 4.1 mEq/L (ref 3.5–5.1)
Sodium: 135 mEq/L (ref 135–145)
Total Bilirubin: 0.5 mg/dL (ref 0.2–1.2)
Total Protein: 7.6 g/dL (ref 6.0–8.3)

## 2022-10-07 LAB — LIPID PANEL
Cholesterol: 164 mg/dL (ref 0–200)
HDL: 40.5 mg/dL (ref >39.00)
LDL Cholesterol: 95 mg/dL (ref 0–99)
NonHDL: 123.16
Total CHOL/HDL Ratio: 4
Triglycerides: 139 mg/dL (ref 0.0–149.0)
VLDL: 27.8 mg/dL (ref 0.0–40.0)

## 2022-10-07 LAB — VITAMIN D 25 HYDROXY (VIT D DEFICIENCY, FRACTURES): VITD: 41.04 ng/mL (ref 30.00–100.00)

## 2022-10-07 LAB — HEMOGLOBIN A1C: Hgb A1c MFr Bld: 6.2 % (ref 4.6–6.5)

## 2022-10-07 MED ORDER — ROSUVASTATIN CALCIUM 10 MG PO TABS
10.0000 mg | ORAL_TABLET | Freq: Every day | ORAL | 3 refills | Status: DC
Start: 2022-10-07 — End: 2023-10-27

## 2022-10-07 NOTE — Assessment & Plan Note (Signed)
Following with psychiatrist. 

## 2022-10-07 NOTE — Assessment & Plan Note (Signed)
Continue current dose of vit D supplementation. Further recommendations according to 25 OH vit D result. 

## 2022-10-07 NOTE — Assessment & Plan Note (Signed)
HgA1C was 6.1 in 05/2021. Encouraged consistently with a healthy life style for diabetes prevention. Further recommendations according to HgA1C result.

## 2022-10-07 NOTE — Assessment & Plan Note (Signed)
Continue Rosuvastatin 10 mg daily and low fat diet. Further recommendations according to FLP results. Follow up in 12 months, before if needed.

## 2022-10-07 NOTE — Patient Instructions (Addendum)
A few things to remember from today's visit:  Prediabetes - Plan: Comprehensive metabolic panel, Hemoglobin A1c  Hyperlipemia, mixed - Plan: Comprehensive metabolic panel, Lipid panel  Anxiety disorder, unspecified type  Vitamin D deficiency - Plan: Comprehensive metabolic panel, VITAMIN D 25 Hydroxy (Vit-D Deficiency, Fractures)  Colon cancer screening - Plan: Ambulatory referral to Gastroenterology  No changes today.  If you need refills for medications you take chronically, please call your pharmacy. Do not use My Chart to request refills or for acute issues that need immediate attention. If you send a my chart message, it may take a few days to be addressed, specially if I am not in the office.  Please be sure medication list is accurate. If a new problem present, please set up appointment sooner than planned today.

## 2022-10-16 ENCOUNTER — Ambulatory Visit (INDEPENDENT_AMBULATORY_CARE_PROVIDER_SITE_OTHER): Payer: BC Managed Care – PPO | Admitting: Psychology

## 2022-10-16 DIAGNOSIS — F411 Generalized anxiety disorder: Secondary | ICD-10-CM | POA: Diagnosis not present

## 2022-10-16 DIAGNOSIS — F3281 Premenstrual dysphoric disorder: Secondary | ICD-10-CM

## 2022-10-16 NOTE — Progress Notes (Signed)
[facility] Counselor/Therapist Progress Note  Patient ID: Angela Harrell, MRN: [id number]   Date: 10/16/22  Time Spent: 10:05  am - 11:00 am : 55 Minutes  Treatment Type: Individual Therapy.  Reported Symptoms: depression and anxiety.   Mental Status Exam: Appearance:  Casual     Behavior: Appropriate  Motor: Normal  Speech/Language:  Clear and Coherent  Affect: Congruent  Mood: normal  Thought process: normal  Thought content:   WNL  Sensory/Perceptual disturbances:   WNL  Orientation: oriented to person, place, time/date, and situation  Attention: Good  Concentration: Good  Memory: WNL  Fund of knowledge:  Good  Insight:   Good  Judgment:  Good  Impulse Control: Good   Risk Assessment: Danger to Self:  No Self-injurious Behavior: No Danger to Others: No Duty to Warn:no Physical Aggression / Violence:No  Access to Firearms a concern: No  Gang Involvement:No   Subjective:   Angela Harrell participated from home, via video and consented to treatment. Therapist participated from home office. We met online due to COVID pandemic. Angela Harrell reviewed the events of the past week. Angela Harrell noted increased financial pressures included the breakdown of a family car, a need to purchase a new car, which then required additional repairs. She noted some health concerns due to losing balance and a need for a colonoscopy. She noted the stress of balancing these financial stressors and completing tasks that require specific timelines. She noted the stress of hosting family from out of town and the stress related to this. She noted her father's continued cognitive decline due to dementia. She noted having to set boundaries with visiting family members due to receiving varying requests. Therapist praised Angela Harrell for this. She noted working on being more assertive within the volunteering role and noted this going well, overall. She noted continuing to lifts weight regularly and noted losing  some weight, as a result. Therapist praised Angela Harrell for her effort to set boundaries, engage in self-care, and working on managing stressors. Therapist validated and normalized Angela Harrell's feelings and experience and provided supportive therapy.    Interventions: Interpersonal.   Diagnosis:  Generalized anxiety disorder  PMDD (premenstrual dysphoric disorder)  Psychiatric Treatment: Yes , Angela Harrell - [facility]. See chart for meds.    Treatment Plan:  Client Abilities/Strengths Angela Harrell is self-aware and motivated for change.   Support System: Family and friends.   Client Treatment Preferences Outpatient Therapy.   Client Statement of Needs Angela Harrell would like to manage day-to-day stressors, process past events, process her father's cognitive decline and behavior in public,  being less avoidant, manage family stressors, improve motivation and organization, spend more time outside, and manage symptoms.   Treatment Level Weekly  Symptoms  Depression: Loss of interest, fluctuating sleep, lethargy, feeling bad about self, and difficulty concentrating.    (Status: maintained) Anxiety: Anxious, trouble relaxing, and irritability.    (Status: maintained)  Goals:   Samariyah experiences symptoms of anxiety and depression   Target Date: 09/18/23 Frequency: Weekly  Progress: 0 Modality: individual    Therapist will provide referrals for additional resources as appropriate.  Therapist will provide psycho-education regarding Angela Harrell's diagnosis and corresponding treatment approaches and interventions. Licensed Clinical Social Worker, [place], [name] will support the patient's ability to achieve the goals identified. will employ CBT, BA, Problem-solving, Solution Focused, Mindfulness,  coping skills, & other evidenced-based practices will be used to promote progress towards healthy functioning to help manage decrease symptoms associated with her diagnosis.   Reduce overall level,  frequency, and intensity of the feelings of depression, anxiety and panic evidenced by decreased overall symptoms from 6 to 7 days/week to 0 to 1 days/week per client report for at least 3 consecutive months. Verbally express understanding of the relationship between feelings of depression, anxiety and their impact on thinking patterns and behaviors. Verbalize an understanding of the role that distorted thinking plays in creating fears, excessive worry, and ruminations.    Angela Harrell participated in the creation of the treatment plan)    [name], [name]

## 2022-10-30 ENCOUNTER — Ambulatory Visit (INDEPENDENT_AMBULATORY_CARE_PROVIDER_SITE_OTHER): Payer: BC Managed Care – PPO | Admitting: Psychology

## 2022-10-30 DIAGNOSIS — F411 Generalized anxiety disorder: Secondary | ICD-10-CM

## 2022-10-30 DIAGNOSIS — F3281 Premenstrual dysphoric disorder: Secondary | ICD-10-CM

## 2022-10-30 NOTE — Progress Notes (Unsigned)
[facility] Counselor/Therapist Progress Note  Patient ID: Angela Harrell, MRN: [id number]   Date: 10/30/22  Time Spent: 10:04  am - 10:56 am : 52 Minutes  Treatment Type: Individual Therapy.  Reported Symptoms: depression and anxiety.   Mental Status Exam: Appearance:  Casual     Behavior: Appropriate  Motor: Normal  Speech/Language:  Clear and Coherent  Affect: Congruent  Mood: normal  Thought process: normal  Thought content:   WNL  Sensory/Perceptual disturbances:   WNL  Orientation: oriented to person, place, time/date, and situation  Attention: Good  Concentration: Good  Memory: WNL  Fund of knowledge:  Good  Insight:   Good  Judgment:  Good  Impulse Control: Good   Risk Assessment: Danger to Self:  No Self-injurious Behavior: No Danger to Others: No Duty to Warn:no Physical Aggression / Violence:No  Access to Firearms a concern: No  Gang Involvement:No   Subjective:   Angela Harrell participated from home, via video and consented to treatment. Therapist participated from home office. We met online due to COVID pandemic. Angela Harrell reviewed the events of the past week. She noted poor sleep and middle insomnia this past evening and feeling lethargic. She noted recent familial stressors during a recent meal at a restaurant with numerous family members. She noted having family members with poor boundaries. We discussed the effect of this on their interactions. She noted it being difficult to set boundaries and noted this "feeling aggressive". We explored this during the session and identified contributing factors to this. She provided background regarding her family and their history of poor boundaries.   Angela Harrell noted increased financial pressures included the breakdown of a family car, a need to purchase a new car, which then required additional repairs. She noted some health concerns due to losing balance and a need for a colonoscopy. She noted the stress of  balancing these financial stressors and completing tasks that require specific timelines. She noted the stress of hosting family from out of town and the stress related to this. She noted her father's continued cognitive decline due to dementia. She noted having to set boundaries with visiting family members due to receiving varying requests. Therapist praised Angela Harrell for this. She noted working on being more assertive within the volunteering role and noted this going well, overall. She noted continuing to lifts weight regularly and noted losing some weight, as a result. Therapist praised Angela Harrell for her effort to set boundaries, engage in self-care, and working on managing stressors. Therapist validated and normalized Angela Harrell's feelings and experience and provided supportive therapy.    Interventions: Interpersonal.   Diagnosis:  Generalized anxiety disorder  PMDD (premenstrual dysphoric disorder)  Psychiatric Treatment: Yes , Angela Harrell - [facility]. See chart for meds.    Treatment Plan:  Client Abilities/Strengths Ammarie is self-aware and motivated for change.   Support System: Family and friends.   Client Treatment Preferences Outpatient Therapy.   Client Statement of Needs [name] would like to manage day-to-day stressors, process past events, process her father's cognitive decline and behavior in public,  being less avoidant, manage family stressors, improve motivation and organization, spend more time outside, and manage symptoms.   Treatment Level Weekly  Symptoms  Depression: Loss of interest, fluctuating sleep, lethargy, feeling bad about self, and difficulty concentrating.    (Status: maintained) Anxiety: Anxious, trouble relaxing, and irritability.    (Status: maintained)  Goals:   Leen experiences symptoms of anxiety and depression   Target Date: 09/18/23 Frequency: Weekly  Progress: 0 Modality: individual    Therapist will provide referrals for  additional resources as appropriate.  Therapist will provide psycho-education regarding Amia's diagnosis and corresponding treatment approaches and interventions. Licensed Clinical Social Worker, [place], [name] will support the patient's ability to achieve the goals identified. will employ CBT, BA, Problem-solving, Solution Focused, Mindfulness,  coping skills, & other evidenced-based practices will be used to promote progress towards healthy functioning to help manage decrease symptoms associated with her diagnosis.   Reduce overall level, frequency, and intensity of the feelings of depression, anxiety and panic evidenced by decreased overall symptoms from 6 to 7 days/week to 0 to 1 days/week per client report for at least 3 consecutive months. Verbally express understanding of the relationship between feelings of depression, anxiety and their impact on thinking patterns and behaviors. Verbalize an understanding of the role that distorted thinking plays in creating fears, excessive worry, and ruminations.    Angela Harrell participated in the creation of the treatment plan)    [name], [name]

## 2022-11-08 ENCOUNTER — Ambulatory Visit: Payer: BC Managed Care – PPO | Admitting: *Deleted

## 2022-11-08 DIAGNOSIS — Z1211 Encounter for screening for malignant neoplasm of colon: Secondary | ICD-10-CM

## 2022-11-08 MED ORDER — NA SULFATE-K SULFATE-MG SULF 17.5-3.13-1.6 GM/177ML PO SOLN
1.0000 | Freq: Once | ORAL | 0 refills | Status: AC
Start: 2022-11-08 — End: 2022-11-08

## 2022-11-08 NOTE — Progress Notes (Signed)
Pt's name and DOB verified at the beginning of the pre-visit.  Pt denies any difficulty with ambulating,sitting, laying down or rolling side to side Gave both [facility] main # and MD on call # prior to instructions.  No egg or soy allergy known to patient  No issues known to pt with past sedation with any surgeries or procedures Pt denies having issues being intubated Patient denies ever being intubated Pt has no issues moving head neck or swallowing No FH of Malignant Hyperthermia Pt is not on diet pills Pt is not on home 02  Pt is not on blood thinners  Pt denies issues with constipation  Pt is not on dialysis Pt denise any abnormal heart rhythms  Pt denies any upcoming cardiac testing Pt encouraged to use to use Singlecare or Goodrx to reduce cost  Patient's chart reviewed by Cathlyn Parsons CNRA prior to pre-visit and patient appropriate for the [facility].  Pre-visit completed and red dot placed by patient's name on their procedure day (on provider's schedule).  . Visit by phonein personcale weight is 203 lb Instructed pt why it is important to and  to call if they have any changes in health or new medications. Directed them to the # given and on instructions.   Pt states they will.  Instructions reviewed with pt and pt states understanding. Instructed to review again prior to procedure. Pt states they will.  Instructions given to pt  with coupon

## 2022-11-11 ENCOUNTER — Encounter: Payer: Self-pay | Admitting: Gastroenterology

## 2022-11-13 ENCOUNTER — Ambulatory Visit (INDEPENDENT_AMBULATORY_CARE_PROVIDER_SITE_OTHER): Payer: BC Managed Care – PPO | Admitting: Psychology

## 2022-11-13 DIAGNOSIS — F3281 Premenstrual dysphoric disorder: Secondary | ICD-10-CM | POA: Diagnosis not present

## 2022-11-13 DIAGNOSIS — F411 Generalized anxiety disorder: Secondary | ICD-10-CM

## 2022-11-13 NOTE — Progress Notes (Signed)
[facility] Counselor/Therapist Progress Note  Patient ID: Angela Harrell, MRN: [id number]   Date: 11/13/22  Time Spent: 10:05  am - 11:01 am : 56 Minutes  Treatment Type: Individual Therapy.  Reported Symptoms: depression and anxiety.   Mental Status Exam: Appearance:  Casual     Behavior: Appropriate  Motor: Normal  Speech/Language:  Clear and Coherent  Affect: Congruent  Mood: normal  Thought process: normal  Thought content:   WNL  Sensory/Perceptual disturbances:   WNL  Orientation: oriented to person, place, time/date, and situation  Attention: Good  Concentration: Good  Memory: WNL  Fund of knowledge:  Good  Insight:   Good  Judgment:  Good  Impulse Control: Good   Risk Assessment: Danger to Self:  No Self-injurious Behavior: No Danger to Others: No Duty to Warn:no Physical Aggression / Violence:No  Access to Firearms a concern: No  Gang Involvement:No   Subjective:   Angela Harrell participated from home, via video and consented to treatment. Therapist participated from home office. We met online due to COVID pandemic. Angela Harrell reviewed the events of the past week. She noted recently being diagnosed with COVID and the effect on her mood and attention span. She noted a need to get additional involvement from family in regards to household tasks, specifically managing finances. She noted work related frustrations, which we explored during the session, and a need for additional boundaries with customers. We processed her feelings during this session. She noted an upcoming medical procedure and noted not looking forward to it. We worked on processing this during the session. Therapist validated and normalized Angela Harrell's feelings and experience and provided supportive therapy.   Interventions: Interpersonal.   Diagnosis:  Generalized anxiety disorder  PMDD (premenstrual dysphoric disorder)  Psychiatric Treatment: Yes , Angela Harrell - [facility].  See chart for meds.    Treatment Plan:  Client Abilities/Strengths Angela Harrell is self-aware and motivated for change.   Support System: Family and friends.   Client Treatment Preferences Outpatient Therapy.   Client Statement of Needs Angela Harrell would like to manage day-to-day stressors, process past events, process her father's cognitive decline and behavior in public,  being less avoidant, manage family stressors, improve motivation and organization, spend more time outside, and manage symptoms.   Treatment Level Weekly  Symptoms  Depression: Loss of interest, fluctuating sleep, lethargy, feeling bad about self, and difficulty concentrating.    (Status: maintained) Anxiety: Anxious, trouble relaxing, and irritability.    (Status: maintained)  Goals:   Angela Harrell experiences symptoms of anxiety and depression   Target Date: 09/18/23 Frequency: Weekly  Progress: 0 Modality: individual    Therapist will provide referrals for additional resources as appropriate.  Therapist will provide psycho-education regarding Angela Harrell's diagnosis and corresponding treatment approaches and interventions. Licensed Clinical Social Worker, [place], [name] will support the patient's ability to achieve the goals identified. will employ CBT, BA, Problem-solving, Solution Focused, Mindfulness,  coping skills, & other evidenced-based practices will be used to promote progress towards healthy functioning to help manage decrease symptoms associated with her diagnosis.   Reduce overall level, frequency, and intensity of the feelings of depression, anxiety and panic evidenced by decreased overall symptoms from 6 to 7 days/week to 0 to 1 days/week per client report for at least 3 consecutive months. Verbally express understanding of the relationship between feelings of depression, anxiety and their impact on thinking patterns and behaviors. Verbalize an understanding of the role that distorted thinking plays in creating  fears, excessive worry, and  ruminations.    Angela Harrell participated in the creation of the treatment plan)    Angela Ovens, [name]

## 2022-11-26 ENCOUNTER — Encounter: Payer: Self-pay | Admitting: Gastroenterology

## 2022-11-26 ENCOUNTER — Ambulatory Visit (AMBULATORY_SURGERY_CENTER): Payer: BC Managed Care – PPO | Admitting: Gastroenterology

## 2022-11-26 VITALS — BP 128/72 | HR 69 | Temp 98.6°F | Resp 13 | Ht 63.0 in | Wt 204.4 lb

## 2022-11-26 DIAGNOSIS — K6389 Other specified diseases of intestine: Secondary | ICD-10-CM

## 2022-11-26 DIAGNOSIS — Z1211 Encounter for screening for malignant neoplasm of colon: Secondary | ICD-10-CM

## 2022-11-26 MED ORDER — SODIUM CHLORIDE 0.9 % IV SOLN
500.0000 mL | INTRAVENOUS | Status: AC
Start: 2022-11-26 — End: ?

## 2022-11-26 NOTE — Progress Notes (Signed)
Vss nad trans to pacu 

## 2022-11-26 NOTE — Progress Notes (Signed)
GASTROENTEROLOGY PROCEDURE H&P NOTE   Primary Care Physician: [name], [name], MD    Reason for Procedure:  Colon Cancer screening  Plan:    Colonoscopy  Patient is appropriate for endoscopic procedure(s) in the ambulatory ([facility]) setting.  The nature of the procedure, as well as the risks, benefits, and alternatives were carefully and thoroughly reviewed with the patient. Ample time for discussion and questions allowed. The patient understood, was satisfied, and agreed to proceed.     HPI: Angela Harrell is a 47 y.o. female who presents for colonoscopy for routine Colon Cancer screening.  No active GI symptoms.  No known family history of colon cancer or related malignancy.  Patient is otherwise without complaints or active issues today.  Past Medical History:  Diagnosis Date   ALLERGIC RHINITIS 05/31/2008   Anemia    ANXIETY 05/31/2008   ASTHMA 05/31/2008   Asthma    CHOLELITHIASIS 05/31/2008   High blood pressure    readings with stones   History of kidney stones    History of right breast biopsy 2017   HYPERLIPIDEMIA 05/31/2008   Kidney stone    NEPHROLITHIASIS, HX OF 05/31/2008    Past Surgical History:  Procedure Laterality Date   CESAREAN SECTION  2003,2005,2007   CHOLECYSTECTOMY  1999   KIDNEY STONE SURGERY     ORIF CONGENITAL HIP DISLOCATION     poly syst removal     1998    Prior to Admission medications   Medication Sig Start Date End Date Taking? Authorizing Provider  cetirizine (ZYRTEC) 10 MG tablet Take 10 mg by mouth daily.   Yes [provider]  rosuvastatin (CRESTOR) 10 MG tablet Take 1 tablet (10 mg total) by mouth daily. Please call [phone] to schedule a follow up appointment. 10/07/22  Yes [name], [name], MD  sertraline (ZOLOFT) 50 MG tablet Take 1 tablet (50 mg total) by mouth daily. 07/08/22  Yes Mozingo, Thereasa Solo, NP  albuterol (PROVENTIL HFA) 108 (90 Base) MCG/ACT inhaler Inhale 2 puffs into the lungs every 6 (six) hours  as needed for wheezing or shortness of breath. 02/02/16   [name], [name], MD  Cholecalciferol (VITAMIN D3 PO) Take 2,000 mg by mouth daily.     [provider]  LUTEIN PO Take 1 capsule by mouth daily. Patient not taking: Reported on 11/08/2022    [provider]  MAGNESIUM PO Take by mouth.    [provider]  Omega-3 Fatty Acids (FISH OIL) 1000 MG CAPS Take by mouth.    [provider]  Probiotic Product (PROBIOTIC PO) Take 1 tablet by mouth every other day.    [provider]  PSYLLIUM HUSK PO Take 2 capsules by mouth daily.    [provider]  SPIRULINA PO Take 1 tablet by mouth daily. For food allergy prevention    [provider]  Triamcinolone Acetonide (TRIAMCINOLONE 0.1 % CREAM : EUCERIN) CREA Apply 1 application topically 2 (two) times daily as needed for rash or itching. 04/15/18   [name], [name], MD  Turmeric Curcumin 500 MG CAPS Take by mouth.    [provider]    Current Outpatient Medications  Medication Sig Dispense Refill   cetirizine (ZYRTEC) 10 MG tablet Take 10 mg by mouth daily.     rosuvastatin (CRESTOR) 10 MG tablet Take 1 tablet (10 mg total) by mouth daily. Please call [phone] to schedule a follow up appointment. 90 tablet 3   sertraline (ZOLOFT) 50 MG tablet  Take 1 tablet (50 mg total) by mouth daily. 90 tablet 3   albuterol (PROVENTIL HFA) 108 (90 Base) MCG/ACT inhaler Inhale 2 puffs into the lungs every 6 (six) hours as needed for wheezing or shortness of breath. 1 Inhaler 3   Cholecalciferol (VITAMIN D3 PO) Take 2,000 mg by mouth daily.      LUTEIN PO Take 1 capsule by mouth daily. (Patient not taking: Reported on 11/08/2022)     MAGNESIUM PO Take by mouth.     Omega-3 Fatty Acids (FISH OIL) 1000 MG CAPS Take by mouth.     Probiotic Product (PROBIOTIC PO) Take 1 tablet by mouth every other day.     PSYLLIUM HUSK PO Take 2 capsules by mouth daily.     SPIRULINA PO Take 1 tablet by mouth  daily. For food allergy prevention     Triamcinolone Acetonide (TRIAMCINOLONE 0.1 % CREAM : EUCERIN) CREA Apply 1 application topically 2 (two) times daily as needed for rash or itching. 500 each 1   Turmeric Curcumin 500 MG CAPS Take by mouth.     Current Facility-Administered Medications  Medication Dose Route Frequency Provider Last Rate Last Admin   0.9 %  sodium chloride infusion  500 mL Intravenous Continuous Dennie Vecchio V, DO        Allergies as of 11/26/2022 - Review Complete 11/26/2022  Allergen Reaction Noted   Beef-derived products Diarrhea 04/02/2017   Prochlorperazine edisylate Shortness Of Breath    Prochlorperazine edisylate Swelling 03/27/2018   Tree extract Anaphylaxis 03/27/2018   Banana Itching and Nausea And Vomiting 10/03/2012   Beef (bovine) protein  03/27/2018   Oxycodone-acetaminophen  03/27/2018   Permethrin Rash 03/27/2018   Statins Itching 03/27/2018   Morphine and codeine Itching 10/03/2012   Other  10/17/2014   Oxycodone-acetaminophen Nausea And Vomiting    Latex Hives and Rash 10/03/2012    Family History  Problem Relation Age of Onset   Breast cancer Mother 69   Arthritis Mother    Hyperlipidemia Father        triglycerides   Mental illness Brother        bipolar   Asthma Brother    Cancer Maternal Uncle        mult myeloma   Vascular Disease Maternal Uncle    Heart disease Paternal Grandfather    Allergic Disorder Daughter    Colon cancer Neg Hx    Colon polyps Neg Hx    Esophageal cancer Neg Hx    Stomach cancer Neg Hx    Rectal cancer Neg Hx     Social History   Socioeconomic History   Marital status: Married    Spouse name: Not on file   Number of children: 3   Years of education: BS   Highest education level: Bachelor's degree (e.g., BA, AB, BS)  Occupational History   Occupation: Metallurgist   Occupation: Substitute  Tobacco Use   Smoking status: Never   Smokeless tobacco: Never  Substance and Sexual Activity    Alcohol use: No   Drug use: No   Sexual activity: Yes    Birth control/protection: Surgical  Other Topics Concern   Not on file  Social History Narrative   Lives with husband & 3 kids, work at home, PT outside of home. Often volunteers.   Social Determinants of Health   Financial Resource Strain: Low Risk  (10/06/2022)   Overall Financial Resource Strain (CARDIA)    Difficulty of Paying Living Expenses: Not very hard  Food Insecurity: No Food Insecurity (10/06/2022)   Hunger Vital Sign    Worried About Running Out of Food in the Last Year: Never true    Ran Out of Food in the Last Year: Never true  Transportation Needs: No Transportation Needs (10/06/2022)   PRAPARE - Administrator, Civil Service (Medical): No    Lack of Transportation (Non-Medical): No  Physical Activity: Insufficiently Active (10/06/2022)   Exercise Vital Sign    Days of Exercise per Week: 3 days    Minutes of Exercise per Session: 20 min  Stress: Stress Concern Present (10/06/2022)   [employer] of Occupational Health - Occupational Stress Questionnaire    Feeling of Stress : To some extent  Social Connections: Socially Integrated (10/06/2022)   Social Connection and Isolation Panel [NHANES]    Frequency of Communication with Friends and Family: More than three times a week    Frequency of Social Gatherings with Friends and Family: Once a week    Attends Religious Services: More than 4 times per year    Active Member of [employer] or Organizations: Yes    Attends Engineer, structural: More than 4 times per year    Marital Status: Married  Catering manager Violence: Not on file    Physical Exam: Vital signs in last 24 hours: @BP  131/64   Pulse (!) 103   Temp 98.6 F (37 C)   Ht 5\' 3"  (1.6 m)   Wt 204 lb 6.4 oz (92.7 kg)   SpO2 98%   BMI 36.21 kg/m  GEN: NAD EYE: Sclerae anicteric ENT: MMM CV: Non-tachycardic Pulm: CTA b/l GI: Soft, NT/ND NEURO:  Alert & Oriented x 3   Doristine Locks, DO [facility]   11/26/2022 9:04 AM

## 2022-11-26 NOTE — Progress Notes (Signed)
Patient denies any changes to health or medications since pre visit.

## 2022-11-26 NOTE — Patient Instructions (Signed)
Await pathology results  Resume previous diet and continue present medications Repeat colonoscopy in 10 years for surveillance! Follow-up in GI office as needed   YOU HAD AN ENDOSCOPIC PROCEDURE TODAY AT THE [facility]:   Refer to the procedure report that was given to you for any specific questions about what was found during the examination.  If the procedure report does not answer your questions, please call your gastroenterologist to clarify.  If you requested that your care partner not be given the details of your procedure findings, then the procedure report has been included in a sealed envelope for you to review at your convenience later.  YOU SHOULD EXPECT: Some feelings of bloating in the abdomen. Passage of more gas than usual.  Walking can help get rid of the air that was put into your GI tract during the procedure and reduce the bloating. If you had a lower endoscopy (such as a colonoscopy or flexible sigmoidoscopy) you may notice spotting of blood in your stool or on the toilet paper. If you underwent a bowel prep for your procedure, you may not have a normal bowel movement for a few days.  Please Note:  You might notice some irritation and congestion in your nose or some drainage.  This is from the oxygen used during your procedure.  There is no need for concern and it should clear up in a day or so.  SYMPTOMS TO REPORT IMMEDIATELY:  Following lower endoscopy (colonoscopy or flexible sigmoidoscopy):  Excessive amounts of blood in the stool  Significant tenderness or worsening of abdominal pains  Swelling of the abdomen that is new, acute  Fever of 100F or higher   For urgent or emergent issues, a gastroenterologist can be reached at any hour by calling [phone]. Do not use MyChart messaging for urgent concerns.    DIET:  We do recommend a small meal at first, but then you may proceed to your regular diet.  Drink plenty of fluids but you should avoid  alcoholic beverages for 24 hours.  ACTIVITY:  You should plan to take it easy for the rest of today and you should NOT DRIVE or use heavy machinery until tomorrow (because of the sedation medicines used during the test).    FOLLOW UP: Our staff will call the number listed on your records the next business day following your procedure.  We will call around 7:15- 8:00 am to check on you and address any questions or concerns that you may have regarding the information given to you following your procedure. If we do not reach you, we will leave a message.     If any biopsies were taken you will be contacted by phone or by letter within the next 1-3 weeks.  Please call us at [phone] if you have not heard about the biopsies in 3 weeks.    SIGNATURES/CONFIDENTIALITY: You and/or your care partner have signed paperwork which will be entered into your electronic medical record.  These signatures attest to the fact that that the information above on your After Visit Summary has been reviewed and is understood.  Full responsibility of the confidentiality of this discharge information lies with you and/or your care-partner.

## 2022-11-26 NOTE — Op Note (Signed)
[facility] Patient Name: Angela Harrell Procedure Date: 11/26/2022 9:04 AM MRN: [id number] Endoscopist: Doristine Locks , MD, [id number] Age: 47 Referring MD:  Date of Birth: Mar 06, 1976 Gender: Female Account #: [id number] Procedure:                Colonoscopy Indications:              Screening for colorectal malignant neoplasm, This                            is the patient's first colonoscopy.                           Separately, she does endorse intermittent changes                            in bowel habits with episodic non-bloody diarrhea. Medicines:                Monitored Anesthesia Care Procedure:                Pre-Anesthesia Assessment:                           - Prior to the procedure, a History and Physical                            was performed, and patient medications and                            allergies were reviewed. The patient's tolerance of                            previous anesthesia was also reviewed. The risks                            and benefits of the procedure and the sedation                            options and risks were discussed with the patient.                            All questions were answered, and informed consent                            was obtained. Prior Anticoagulants: The patient has                            taken no anticoagulant or antiplatelet agents. ASA                            Grade Assessment: II - A patient with mild systemic                            disease. After reviewing the risks and benefits,  the patient was deemed in satisfactory condition to                            undergo the procedure.                           After obtaining informed consent, the colonoscope                            was passed under direct vision. Throughout the                            procedure, the patient's blood pressure, pulse, and                            oxygen saturations were  monitored continuously. The                            Olympus CF-HQ190L SN [id number] was introduced through                            the anus and advanced to the the terminal ileum.                            The colonoscopy was performed without difficulty.                            The patient tolerated the procedure well. The                            quality of the bowel preparation was excellent. The                            terminal ileum, ileocecal valve, appendiceal                            orifice, and rectum were photographed. Scope In: 9:14:31 AM Scope Out: 9:27:06 AM Scope Withdrawal Time: 0 hours 10 minutes 17 seconds  Total Procedure Duration: 0 hours 12 minutes 35 seconds  Findings:                 The perianal and digital rectal examinations were                            normal.                           The entire colon appeared normal. Biopsies for                            histology were taken with a cold forceps from the                            right colon and left colon for evaluation of  microscopic colitis. Estimated blood loss was                            minimal.                           Anal papilla(e) were hypertrophied. Otherwise,                            normal retroflexed views in the rectum.                           The terminal ileum appeared normal. Complications:            No immediate complications. Estimated Blood Loss:     Estimated blood loss was minimal. Impression:               - The entire examined colon is normal. Biopsied.                           - Benign hypertrophied anal papillae were seen on                            retroflexion.                           - The examined portion of the ileum was normal.                           - The GI Genius (intelligent endoscopy module),                            computer-aided polyp detection system powered by AI                            was  utilized to detect colorectal polyps through                            enhanced visualization during colonoscopy. Recommendation:           - Patient has a contact number available for                            emergencies. The signs and symptoms of potential                            delayed complications were discussed with the                            patient. Return to normal activities tomorrow.                            Written discharge instructions were provided to the                            patient.                           -  Resume previous diet.                           - Continue present medications.                           - Await pathology results.                           - Repeat colonoscopy in 10 years for screening                            purposes.                           - Return to GI office PRN. Doristine Locks, MD 11/26/2022 9:33:05 AM

## 2022-11-27 ENCOUNTER — Telehealth: Payer: Self-pay | Admitting: *Deleted

## 2022-11-27 ENCOUNTER — Ambulatory Visit (INDEPENDENT_AMBULATORY_CARE_PROVIDER_SITE_OTHER): Payer: BC Managed Care – PPO | Admitting: Psychology

## 2022-11-27 DIAGNOSIS — F411 Generalized anxiety disorder: Secondary | ICD-10-CM

## 2022-11-27 DIAGNOSIS — F3281 Premenstrual dysphoric disorder: Secondary | ICD-10-CM

## 2022-11-27 NOTE — Telephone Encounter (Signed)
Follow up call attempt.  LVM to call if any questions or concerns. 

## 2022-11-27 NOTE — Progress Notes (Signed)
[facility] Counselor/Therapist Progress Note  Patient ID: Angela Harrell, MRN: [id number]   Date: 11/27/22  Time Spent: 10:05  am - 11:01 am : 56 Minutes  Treatment Type: Individual Therapy.  Reported Symptoms: depression and anxiety.   Mental Status Exam: Appearance:  Casual     Behavior: Appropriate  Motor: Normal  Speech/Language:  Clear and Coherent  Affect: Congruent  Mood: normal  Thought process: normal  Thought content:   WNL  Sensory/Perceptual disturbances:   WNL  Orientation: oriented to person, place, time/date, and situation  Attention: Good  Concentration: Good  Memory: WNL  Fund of knowledge:  Good  Insight:   Good  Judgment:  Good  Impulse Control: Good   Risk Assessment: Danger to Self:  No Self-injurious Behavior: No Danger to Others: No Duty to Warn:no Physical Aggression / Violence:No  Access to Firearms a concern: No  Gang Involvement:No   Subjective:   Angela Harrell participated from home, via video and consented to treatment. Therapist participated from home office. We met online due to COVID pandemic. [name] reviewed the events of the past week. Angela Harrell noted getting a recent biopsy and noted awaiting her results. She noted things otherwise going well. She noted a recent loss in the family. We explored this during the session. She noted a need to set boundaries with her grandmother who is high needs. She noted her father's poor cognitive functioning and the struggles for him and the family. She noted a need to maintain boundaries in this area. She provided feedback regarding her family's mental health history. She noted worry about her husband's inattention and noted encouragement to get tested for ADHD.  Therapist encouraged self-care, boundaries with others, and engagement in enjoyable activities. Therapist validated and normalized [name]'s feelings and experience and provided supportive therapy.   Interventions: Interpersonal.    Diagnosis:  Generalized anxiety disorder  PMDD (premenstrual dysphoric disorder)  Psychiatric Treatment: Yes , [name] - [facility]. See chart for meds.    Treatment Plan:  Client Abilities/Strengths [name] is self-aware and motivated for change.   Support System: Family and friends.   Client Treatment Preferences Outpatient Therapy.   Client Statement of Needs Angela Harrell would like to manage day-to-day stressors, process past events, process her father's cognitive decline and behavior in public,  being less avoidant, manage family stressors, improve motivation and organization, spend more time outside, and manage symptoms.   Treatment Level Weekly  Symptoms  Depression: Loss of interest, fluctuating sleep, lethargy, feeling bad about self, and difficulty concentrating.    (Status: maintained) Anxiety: Anxious, trouble relaxing, and irritability.    (Status: maintained)  Goals:   Rillie experiences symptoms of anxiety and depression   Target Date: 09/18/23 Frequency: Weekly  Progress: 0 Modality: individual    Therapist will provide referrals for additional resources as appropriate.  Therapist will provide psycho-education regarding [name]'s diagnosis and corresponding treatment approaches and interventions. Licensed Clinical Social Worker, [place], [name] will support the patient's ability to achieve the goals identified. will employ CBT, BA, Problem-solving, Solution Focused, Mindfulness,  coping skills, & other evidenced-based practices will be used to promote progress towards healthy functioning to help manage decrease symptoms associated with her diagnosis.   Reduce overall level, frequency, and intensity of the feelings of depression, anxiety and panic evidenced by decreased overall symptoms from 6 to 7 days/week to 0 to 1 days/week per client report for at least 3 consecutive months. Verbally express understanding of the relationship between  feelings of depression,  anxiety and their impact on thinking patterns and behaviors. Verbalize an understanding of the role that distorted thinking plays in creating fears, excessive worry, and ruminations.    [name] participated in the creation of the treatment plan)    [name], [name]

## 2022-12-11 ENCOUNTER — Ambulatory Visit (INDEPENDENT_AMBULATORY_CARE_PROVIDER_SITE_OTHER): Payer: BC Managed Care – PPO | Admitting: Psychology

## 2022-12-11 DIAGNOSIS — F3281 Premenstrual dysphoric disorder: Secondary | ICD-10-CM

## 2022-12-11 DIAGNOSIS — F411 Generalized anxiety disorder: Secondary | ICD-10-CM | POA: Diagnosis not present
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
## 2022-12-25 ENCOUNTER — Ambulatory Visit (INDEPENDENT_AMBULATORY_CARE_PROVIDER_SITE_OTHER): Payer: BC Managed Care – PPO | Admitting: Psychology

## 2022-12-25 DIAGNOSIS — F411 Generalized anxiety disorder: Secondary | ICD-10-CM | POA: Diagnosis not present

## 2022-12-25 DIAGNOSIS — F3281 Premenstrual dysphoric disorder: Secondary | ICD-10-CM | POA: Diagnosis not present

## 2022-12-25 NOTE — Progress Notes (Signed)
[facility] Counselor/Therapist Progress Note  Patient ID: Angela Harrell, MRN: [id number]   Date: 12/25/22  Time Spent: 10:24 am - 11:03 am : 39 Minutes  Treatment Type: Individual Therapy.  Reported Symptoms: depression and anxiety.   Mental Status Exam: Appearance:  Casual     Behavior: Appropriate  Motor: Normal  Speech/Language:  Clear and Coherent  Affect: Congruent  Mood: normal  Thought process: normal  Thought content:   WNL  Sensory/Perceptual disturbances:   WNL  Orientation: oriented to person, place, time/date, and situation  Attention: Good  Concentration: Good  Memory: WNL  Fund of knowledge:  Good  Insight:   Good  Judgment:  Good  Impulse Control: Good   Risk Assessment: Danger to Self:  No Self-injurious Behavior: No Danger to Others: No Duty to Warn:no Physical Aggression / Violence:No  Access to Firearms a concern: No  Gang Involvement:No   Subjective:   Angela Harrell participated from car, via video, is aware of the limitations of tele-sessions, and consented to treatment. Therapist participated from home office. Angela Harrell reviewed the events of the past week. We started the session late due to technical difficulties. She noted worry regarding her son's health and undiagnosed health issue. She noted difficulty with sleep, despite her efforts to manage this. She noted stressors that could have contributed to her poor sleep including financial stressors due to unexpected expenses. We worked on processing this, reviewing coping, and focusing on improving sleep and engaging in relaxation including identifying alternatives when weather is a barrier. Angela Harrell was engaged and motivated during the session. Therapist provided supportive therapy and encouraged self-care. Therapist provided supportive therapy.    Interventions: Interpersonal.   Diagnosis:  Generalized anxiety disorder  PMDD (premenstrual dysphoric disorder)  Psychiatric Treatment: Yes  , Angela Harrell - [facility]. See chart for meds.    Treatment Plan:  Client Abilities/Strengths Angela Harrell is self-aware and motivated for change.   Support System: Family and friends.   Client Treatment Preferences Outpatient Therapy.   Client Statement of Needs Angela Harrell would like to manage day-to-day stressors, process past events, process her father's cognitive decline and behavior in public,  being less avoidant, manage family stressors, improve motivation and organization, spend more time outside, and manage symptoms.   Treatment Level Weekly  Symptoms  Depression: Loss of interest, fluctuating sleep, lethargy, feeling bad about self, and difficulty concentrating.    (Status: maintained) Anxiety: Anxious, trouble relaxing, and irritability.    (Status: maintained)  Goals:   Angela Harrell experiences symptoms of anxiety and depression   Target Date: 09/18/23 Frequency: Weekly  Progress: 0 Modality: individual    Therapist will provide referrals for additional resources as appropriate.  Therapist will provide psycho-education regarding Angela Harrell's diagnosis and corresponding treatment approaches and interventions. Licensed Clinical Social Worker, [place], [name] will support the patient's ability to achieve the goals identified. will employ CBT, BA, Problem-solving, Solution Focused, Mindfulness,  coping skills, & other evidenced-based practices will be used to promote progress towards healthy functioning to help manage decrease symptoms associated with her diagnosis.   Reduce overall level, frequency, and intensity of the feelings of depression, anxiety and panic evidenced by decreased overall symptoms from 6 to 7 days/week to 0 to 1 days/week per client report for at least 3 consecutive months. Verbally express understanding of the relationship between feelings of depression, anxiety and their impact on thinking patterns and behaviors. Verbalize an understanding of the role  that distorted thinking plays in creating fears, excessive worry, and ruminations.    (  Angela Harrell participated in the creation of the treatment plan)    Angela Ovens, [name]

## 2023-01-06 ENCOUNTER — Encounter: Payer: Self-pay | Admitting: Adult Health

## 2023-01-06 ENCOUNTER — Ambulatory Visit: Payer: BC Managed Care – PPO | Admitting: Adult Health

## 2023-01-06 DIAGNOSIS — F411 Generalized anxiety disorder: Secondary | ICD-10-CM | POA: Diagnosis not present

## 2023-01-06 DIAGNOSIS — F3281 Premenstrual dysphoric disorder: Secondary | ICD-10-CM

## 2023-01-06 NOTE — Progress Notes (Signed)
Angela Harrell [id number] 1976-06-23 47 y.o.  Subjective:   Patient ID:  Angela Harrell is a 47 y.o. (DOB 10-Oct-1975) female.  Chief Complaint: No chief complaint on file.   HPI Angela Harrell presents to the office today for follow-up of PMDD and GAD.  Describes mood today as "ok". Pleasant. Decreased tearfulness - appropritate. Mood symptoms - reports some depression - lack of motivation and interest. Denies anxiety. Reports getting overwhelmed - difficulties tuning things out. Denies irritability. Reports decreased worry, rumination, and over thinking. Mood is more blah - effected by menstrual cycles. Stating "I feel better than I did, but maybe too dulled down". Feels like the Zoloft is helpful, but may need to be reduced. Family doing well. Seeing therapist - Lennice Sites. Taking current medications as prescribed. Energy levels vary. Active, does not have a regular exercise routine. Enjoys some usual interests and activities. Married 23 years - 3 children. Parents local - grandmother. Spending time with family. Appetite adequate. Weight gain - 10 pounds - 190 to 200 pounds. Sleeps well most nights. Averages 6 hours. Napping some during the day - depends on the day. Focus and concentration difficulties. Completing tasks. Managing aspects of household. Works part-time - Financial trader. Denies SI or HI.  Denies AH or VH. Denies self harm. Denies substance use.  History of seasonal depression.  Previous medication trials: Celexa 10mg  daily   GAD-7    Flowsheet Row Office Visit from 10/07/2022 in [facility] [place]  Total GAD-7 Score 2      PHQ2-9    Flowsheet Row Office Visit from 10/07/2022 in [facility] [place] Office Visit from 05/22/2021 in [facility] [place] Office Visit from 07/17/2018 in [facility] [place] Office Visit from 03/18/2018 in [facility] [place]  Office Visit from 11/09/2014 in [facility] [place]  PHQ-2 Total Score 1 0 2 3 1   PHQ-9 Total Score 5 3 6 8  --        Review of Systems:  Review of Systems  Musculoskeletal:  Negative for gait problem.  Neurological:  Negative for tremors.  Psychiatric/Behavioral:         Please refer to HPI    Medications: I have reviewed the patient's current medications.  Current Outpatient Medications  Medication Sig Dispense Refill   albuterol (PROVENTIL HFA) 108 (90 Base) MCG/ACT inhaler Inhale 2 puffs into the lungs every 6 (six) hours as needed for wheezing or shortness of breath. 1 Inhaler 3   cetirizine (ZYRTEC) 10 MG tablet Take 10 mg by mouth daily.     Cholecalciferol (VITAMIN D3 PO) Take 2,000 mg by mouth daily.      LUTEIN PO Take 1 capsule by mouth daily. (Patient not taking: Reported on 11/08/2022)     MAGNESIUM PO Take by mouth.     Omega-3 Fatty Acids (FISH OIL) 1000 MG CAPS Take by mouth.     Probiotic Product (PROBIOTIC PO) Take 1 tablet by mouth every other day.     PSYLLIUM HUSK PO Take 2 capsules by mouth daily.     rosuvastatin (CRESTOR) 10 MG tablet Take 1 tablet (10 mg total) by mouth daily. Please call [phone] to schedule a follow up appointment. 90 tablet 3   sertraline (ZOLOFT) 50 MG tablet Take 1 tablet (50 mg total) by mouth daily. 90 tablet 3   SPIRULINA PO Take 1 tablet by mouth daily. For food allergy prevention  Triamcinolone Acetonide (TRIAMCINOLONE 0.1 % CREAM : EUCERIN) CREA Apply 1 application topically 2 (two) times daily as needed for rash or itching. 500 each 1   Turmeric Curcumin 500 MG CAPS Take by mouth.     Current Facility-Administered Medications  Medication Dose Route Frequency Provider Last Rate Last Admin   0.9 %  sodium chloride infusion  500 mL Intravenous Continuous Cirigliano, Vito V, DO        Medication Side Effects: None  Allergies:  Allergies  Allergen Reactions   Beef-Derived Products Diarrhea     Nausea and vomiting   Prochlorperazine Edisylate Shortness Of Breath    Compazine Tongue swelling   Prochlorperazine Edisylate Swelling    Swelling of throat   Tree Extract Anaphylaxis    Throat itchy, roof of mouth gets raw   Banana Itching and Nausea And Vomiting    Throat gets itchy   Beef (Bovine) Protein     Diarrhea and stomach cramps    Oxycodone-Acetaminophen     Vomiting   Permethrin Rash    Rash and fever   Statins Itching   Morphine And Codeine Itching   Other     Tree Nuts (can have peanuts and cashews) Pyrethrine (ingredient in lice products)- fever and rash   Oxycodone-Acetaminophen Nausea And Vomiting   Latex Hives and Rash    Rash    Past Medical History:  Diagnosis Date   ALLERGIC RHINITIS 05/31/2008   Anemia    ANXIETY 05/31/2008   ASTHMA 05/31/2008   Asthma    CHOLELITHIASIS 05/31/2008   High blood pressure    readings with stones   History of kidney stones    History of right breast biopsy 2017   HYPERLIPIDEMIA 05/31/2008   Kidney stone    NEPHROLITHIASIS, HX OF 05/31/2008    Past Medical History, Surgical history, Social history, and Family history were reviewed and updated as appropriate.   Please see review of systems for further details on the patient's review from today.   Objective:   Physical Exam:  There were no vitals taken for this visit.  Physical Exam Constitutional:      General: She is not in acute distress. Musculoskeletal:        General: No deformity.  Neurological:     Mental Status: She is alert and oriented to person, place, and time.     Coordination: Coordination normal.  Psychiatric:        Attention and Perception: Attention and perception normal. She does not perceive auditory or visual hallucinations.        Mood and Affect: Affect is not labile, blunt, angry or inappropriate.        Speech: Speech normal.        Behavior: Behavior normal.        Thought Content: Thought content normal. Thought content is  not paranoid or delusional. Thought content does not include homicidal or suicidal ideation. Thought content does not include homicidal or suicidal plan.        Cognition and Memory: Cognition and memory normal.        Judgment: Judgment normal.     Comments: Insight intact     Lab Review:     Component Value Date/Time   NA 135 10/07/2022 0932   K 4.1 10/07/2022 0932   CL 102 10/07/2022 0932   CO2 25 10/07/2022 0932   GLUCOSE 115 (H) 10/07/2022 0932   BUN 13 10/07/2022 0932   CREATININE 0.81 10/07/2022 0932  CALCIUM 9.9 10/07/2022 0932   PROT 7.6 10/07/2022 0932   ALBUMIN 4.6 10/07/2022 0932   AST 32 10/07/2022 0932   ALT 32 10/07/2022 0932   ALKPHOS 69 10/07/2022 0932   BILITOT 0.5 10/07/2022 0932   GFRNONAA 53 (L) 03/30/2017 0550   GFRAA >60 03/30/2017 0550       Component Value Date/Time   WBC 8.2 03/18/2018 1036   RBC 4.61 03/18/2018 1036   HGB 13.3 03/18/2018 1036   HCT 39.7 03/18/2018 1036   PLT 302.0 03/18/2018 1036   MCV 86.2 03/18/2018 1036   MCH 28.4 03/30/2017 0550   MCHC 33.5 03/18/2018 1036   RDW 14.0 03/18/2018 1036   LYMPHSABS 2.9 11/09/2014 0930   MONOABS 0.5 11/09/2014 0930   EOSABS 0.4 11/09/2014 0930   BASOSABS 0.0 11/09/2014 0930    No results found for: "POCLITH", "LITHIUM"   No results found for: "PHENYTOIN", "PHENOBARB", "VALPROATE", "CBMZ"   .res Assessment: Plan:    Plan:  PDMP reviewed  Decrease Zoloft 50mg  to 25mg  daily for the next few weeks - may discontinue.  PMDD screening tool completed initial session.  Considering ADD testing.  RTC 6 months  Patient advised to contact office with any questions, adverse effects, or acute worsening in signs and symptoms.  Diagnoses and all orders for this visit:  Generalized anxiety disorder  PMDD (premenstrual dysphoric disorder)     Please see After Visit Summary for patient specific instructions.  Future Appointments  Date Time Provider Department Center  01/08/2023 10:00  AM [name], [name] [facility] None  01/22/2023 10:00 AM [name], [name] [facility] None  02/05/2023 10:00 AM [name], [name] [facility] None  02/19/2023 10:00 AM [name], [name] [facility] None    No orders of the defined types were placed in this encounter.   -------------------------------

## 2023-01-08 ENCOUNTER — Ambulatory Visit (INDEPENDENT_AMBULATORY_CARE_PROVIDER_SITE_OTHER): Payer: BC Managed Care – PPO | Admitting: Psychology

## 2023-01-08 DIAGNOSIS — F411 Generalized anxiety disorder: Secondary | ICD-10-CM | POA: Diagnosis not present

## 2023-01-08 DIAGNOSIS — F3281 Premenstrual dysphoric disorder: Secondary | ICD-10-CM | POA: Diagnosis not present

## 2023-01-08 NOTE — Progress Notes (Signed)
[facility] Counselor/Therapist Progress Note  Patient ID: Angela Harrell, MRN: [id number]   Date: 01/08/23  Time Spent: 10:07 am - 11:02 am : 55 Minutes  Treatment Type: Individual Therapy.  Reported Symptoms: depression and anxiety.   Mental Status Exam: Appearance:  Casual     Behavior: Appropriate  Motor: Normal  Speech/Language:  Clear and Coherent  Affect: Congruent  Mood: normal  Thought process: normal  Thought content:   WNL  Sensory/Perceptual disturbances:   WNL  Orientation: oriented to person, place, time/date, and situation  Attention: Good  Concentration: Good  Memory: WNL  Fund of knowledge:  Good  Insight:   Good  Judgment:  Good  Impulse Control: Good   Risk Assessment: Danger to Self:  No Self-injurious Behavior: No Danger to Others: No Duty to Warn:no Physical Aggression / Violence:No  Access to Firearms a concern: No  Gang Involvement:No   Subjective:   [name] participated from home, via video, is aware of the limitations of tele-sessions, and consented to treatment. Therapist participated from home office. [name] reviewed the events of the past week. She noted meeting her psychiatric provider and discussed a reduction in her anti-depressant and noted her next appointment will be focusing on an ADHD assessment. She noted a need to work on improving her self-esteem and noted a history of her grandmother and aunt often being judgmental and critical of her at a young age. She noted often receiving negative feedback about her choices including her college major or taking a spanish class. She noted her negative self-talk, as an adult, being affected by this including "do people actually want to spend time with me?" Or " is this the right decision?". She noted spending a lot of time appeasing others, not making waves, and staying out of focus. She noted living in a stressful home due to her brothers' behavior sand her father's mood. She noted a  need to work on prioritizing self but noted this being difficult due to her upbringing. She noted her father often "snapping" When things didn't go his way or "you couldn't read his mind". She noted anxiety regarding her ADHD assessment due to worry that she might react poorly to meds, if prescribed. We worked on processing this. She noted a need to empower her family members to not rely on her for simple tasks and to be more autonomous. We will work on this, going forward. Therapist praised [name] for her mindfulness regarding her needs. Therapist provided supportive therapy.    Interventions: Interpersonal and CBT.   Diagnosis:  Generalized anxiety disorder  PMDD (premenstrual dysphoric disorder)  Psychiatric Treatment: Yes , [name] - [facility]. See chart for meds.    Treatment Plan:  Client Abilities/Strengths Jax is self-aware and motivated for change.   Support System: Family and friends.   Client Treatment Preferences Outpatient Therapy.   Client Statement of Needs Ravneet would like to manage day-to-day stressors, process past events, process her father's cognitive decline and behavior in public,  being less avoidant, manage family stressors, improve motivation and organization, spend more time outside, and manage symptoms.   Treatment Level Weekly  Symptoms  Depression: Loss of interest, fluctuating sleep, lethargy, feeling bad about self, and difficulty concentrating.    (Status: maintained) Anxiety: Anxious, trouble relaxing, and irritability.    (Status: maintained)  Goals:   Valeri experiences symptoms of anxiety and depression   Target Date: 09/18/23 Frequency: Weekly  Progress: 0 Modality: individual    Therapist will provide  referrals for additional resources as appropriate.  Therapist will provide psycho-education regarding [name]'s diagnosis and corresponding treatment approaches and interventions. Licensed Clinical Social Worker, [place], [name] will support the patient's ability to achieve the goals identified. will employ CBT, BA, Problem-solving, Solution Focused, Mindfulness,  coping skills, & other evidenced-based practices will be used to promote progress towards healthy functioning to help manage decrease symptoms associated with her diagnosis.   Reduce overall level, frequency, and intensity of the feelings of depression, anxiety and panic evidenced by decreased overall symptoms from 6 to 7 days/week to 0 to 1 days/week per client report for at least 3 consecutive months. Verbally express understanding of the relationship between feelings of depression, anxiety and their impact on thinking patterns and behaviors. Verbalize an understanding of the role that distorted thinking plays in creating fears, excessive worry, and ruminations.    [name] participated in the creation of the treatment plan)    [name], [name]

## 2023-01-22 ENCOUNTER — Ambulatory Visit (INDEPENDENT_AMBULATORY_CARE_PROVIDER_SITE_OTHER): Payer: BC Managed Care – PPO | Admitting: Psychology

## 2023-01-22 DIAGNOSIS — F3281 Premenstrual dysphoric disorder: Secondary | ICD-10-CM

## 2023-01-22 DIAGNOSIS — F411 Generalized anxiety disorder: Secondary | ICD-10-CM | POA: Diagnosis not present

## 2023-01-22 NOTE — Progress Notes (Signed)
[facility] Counselor/Therapist Progress Note  Patient ID: Angela Harrell, MRN: [id number]   Date: 01/22/23  Time Spent: 10:05 am - 10:57 am : 52 Minutes  Treatment Type: Individual Therapy.  Reported Symptoms: depression and anxiety.   Mental Status Exam: Appearance:  Casual     Behavior: Appropriate  Motor: Normal  Speech/Language:  Clear and Coherent  Affect: Congruent  Mood: normal  Thought process: normal  Thought content:   WNL  Sensory/Perceptual disturbances:   WNL  Orientation: oriented to person, place, time/date, and situation  Attention: Good  Concentration: Good  Memory: WNL  Fund of knowledge:  Good  Insight:   Good  Judgment:  Good  Impulse Control: Good   Risk Assessment: Danger to Self:  No Self-injurious Behavior: No Danger to Others: No Duty to Warn:no Physical Aggression / Violence:No  Access to Firearms a concern: No  Gang Involvement:No   Subjective:   Angela Harrell participated from home, via video, is aware of the limitations of tele-sessions, and consented to treatment. Therapist participated from home office. Angela Harrell reviewed the events of the past week. She noted meeting with her psychiatric provider and titrating down to Zoloft 25 mg every day due to experiencing a lack of motivation and difficulty with task completion at the 25 mg dose. She noted being slated to be assessed for ADHD in August by her psychiatric provider. She noted working on [employer], not delaying tasks, and activating tasks in the morning to aid with task completion. She noted additional attempts to manage her inattention in the meantime. She noted a recent trip and noted this going well overall but discussed frustrating behavior by extended family. She noted judgement by some extended family due to her child being non-binary. We explored this during the session. She noted working on engaging in enjoyable activities and managing the stressors of running a  household. Therapist validated and normalized Angela Harrell feelings and experience and provided supportive therapy.   Interventions: Interpersonal and CBT.   Harrell:  Generalized anxiety disorder  PMDD (premenstrual dysphoric disorder)  Psychiatric Treatment: Yes , Angela Harrell - [facility]. See chart for meds.    Treatment Plan:  Client Abilities/Strengths Angela Harrell is self-aware and motivated for change.   Support System: Family and friends.   Client Treatment Preferences Outpatient Therapy.   Client Statement of Needs Angela Harrell would like to manage day-to-day stressors, process past events, process her father's cognitive decline and behavior in public,  being less avoidant, manage family stressors, improve motivation and organization, spend more time outside, and manage symptoms.   Treatment Level Weekly  Symptoms  Depression: Loss of interest, fluctuating sleep, lethargy, feeling bad about self, and difficulty concentrating.    (Status: maintained) Anxiety: Anxious, trouble relaxing, and irritability.    (Status: maintained)  Goals:   Ayushi experiences symptoms of anxiety and depression   Target Date: 09/18/23 Frequency: Weekly  Progress: 0 Modality: individual    Therapist will provide referrals for additional resources as appropriate.  Therapist will provide psycho-education regarding Angela Harrell and corresponding treatment approaches and interventions. Licensed Clinical Social Worker, [place], [name] will support the patient's ability to achieve the goals identified. will employ CBT, BA, Problem-solving, Solution Focused, Mindfulness,  coping skills, & other evidenced-based practices will be used to promote progress towards healthy functioning to help manage decrease symptoms associated with her Harrell.   Reduce overall level, frequency, and intensity of the feelings of depression, anxiety and panic evidenced by decreased overall symptoms from 6  to 7 days/week to 0 to 1 days/week per client report for at least 3 consecutive months. Verbally express understanding of the relationship between feelings of depression, anxiety and their impact on thinking patterns and behaviors. Verbalize an understanding of the role that distorted thinking plays in creating fears, excessive worry, and ruminations.    [name] participated in the creation of the treatment plan)    [name], [name]

## 2023-02-05 ENCOUNTER — Ambulatory Visit (INDEPENDENT_AMBULATORY_CARE_PROVIDER_SITE_OTHER): Payer: BC Managed Care – PPO | Admitting: Psychology

## 2023-02-05 DIAGNOSIS — F3281 Premenstrual dysphoric disorder: Secondary | ICD-10-CM | POA: Diagnosis not present

## 2023-02-05 DIAGNOSIS — F411 Generalized anxiety disorder: Secondary | ICD-10-CM

## 2023-02-05 NOTE — Progress Notes (Signed)
[facility] Counselor/Therapist Progress Note  Patient ID: Danajah Stehl, MRN: [id number]   Date: 02/05/23  Time Spent: 10:06 am - 10:59 am : 53 Minutes  Treatment Type: Individual Therapy.  Reported Symptoms: depression and anxiety.   Mental Status Exam: Appearance:  Casual     Behavior: Appropriate  Motor: Normal  Speech/Language:  Clear and Coherent  Affect: Congruent  Mood: normal  Thought process: normal  Thought content:   WNL  Sensory/Perceptual disturbances:   WNL  Orientation: oriented to person, place, time/date, and situation  Attention: Good  Concentration: Good  Memory: WNL  Fund of knowledge:  Good  Insight:   Good  Judgment:  Good  Impulse Control: Good   Risk Assessment: Danger to Self:  No Self-injurious Behavior: No Danger to Others: No Duty to Warn:no Physical Aggression / Violence:No  Access to Firearms a concern: No  Gang Involvement:No   Subjective:   [name] participated from home, via video, is aware of the limitations of tele-sessions, and consented to treatment. Therapist participated from home office. Alazne reviewed the events of the past week. She noted doing well with her medication adjustment to Zoloft 25mg  and noted this improving her mood and general ability to engage in tasks. She is slated to meet with her psychiatric provider on the 13th to be assessed for ADHD. She noted upcoming adjustments due to her husband's travel. She noted her effort to be more social but discussed some barriers to this due to scheduling issues. She noted having to manage her own anxiety in relation to socializing due to her childhood history. She noted her father, during her childhood, behaving in a way that caused her anxiety both in childhood and adulthood. She noted being observant of those who might appear to act similarly and noted this being activating for her despite it only being a reminder. She noted stressors at her volunteering  opportunity with a specific volunteer Roe Coombs) and noted often having stress in this area. She discussed her attempts to be assertive, set boundaries, and advocate for self. She noted working on "I don't need to be liked by everyone".  Therapist praised Jewlia for her effort to manage her family of origin issues, manage her jumping to conclusions, and continue to engage. Therapist validated and normalized Keeli's feelings and experience and provided supportive therapy. A follow-up was scheduled for continued treatment.   Interventions: Interpersonal and CBT.   Diagnosis:  Generalized anxiety disorder  PMDD (premenstrual dysphoric disorder)  Psychiatric Treatment: Yes , [name] - [facility]. See chart for meds.    Treatment Plan:  Client Abilities/Strengths Minda is self-aware and motivated for change.   Support System: Family and friends.   Client Treatment Preferences Outpatient Therapy.   Client Statement of Needs Kate would like to manage day-to-day stressors, process past events, process her father's cognitive decline and behavior in public,  being less avoidant, manage family stressors, improve motivation and organization, spend more time outside, and manage symptoms.   Treatment Level Weekly  Symptoms  Depression: Loss of interest, fluctuating sleep, lethargy, feeling bad about self, and difficulty concentrating.    (Status: maintained) Anxiety: Anxious, trouble relaxing, and irritability.    (Status: maintained)  Goals:   Khaya experiences symptoms of anxiety and depression   Target Date: 09/18/23 Frequency: Weekly  Progress: 0 Modality: individual    Therapist will provide referrals for additional resources as appropriate.  Therapist will provide psycho-education regarding Cornelius's diagnosis and corresponding treatment approaches and interventions. Licensed  Clinical Social Worker, [place], [name] will support the patient's ability to achieve the  goals identified. will employ CBT, BA, Problem-solving, Solution Focused, Mindfulness,  coping skills, & other evidenced-based practices will be used to promote progress towards healthy functioning to help manage decrease symptoms associated with her diagnosis.   Reduce overall level, frequency, and intensity of the feelings of depression, anxiety and panic evidenced by decreased overall symptoms from 6 to 7 days/week to 0 to 1 days/week per client report for at least 3 consecutive months. Verbally express understanding of the relationship between feelings of depression, anxiety and their impact on thinking patterns and behaviors. Verbalize an understanding of the role that distorted thinking plays in creating fears, excessive worry, and ruminations.    [name] participated in the creation of the treatment plan)    [name], [name]

## 2023-02-11 ENCOUNTER — Encounter: Payer: Self-pay | Admitting: Adult Health

## 2023-02-11 ENCOUNTER — Ambulatory Visit: Payer: BC Managed Care – PPO | Admitting: Adult Health

## 2023-02-11 DIAGNOSIS — F411 Generalized anxiety disorder: Secondary | ICD-10-CM

## 2023-02-11 DIAGNOSIS — F3281 Premenstrual dysphoric disorder: Secondary | ICD-10-CM

## 2023-02-11 NOTE — Progress Notes (Signed)
Angela Harrell [id number] 03/04/1976 47 y.o.  Subjective:   Patient ID:  Angela Harrell is a 47 y.o. (DOB 1976/06/28) female.  Chief Complaint: No chief complaint on file.   HPI Angela Harrell presents to the office today for follow-up of PMDD and GAD.  Describes mood today as "ok". Pleasant. Decreased tearfulness - appropritate. Mood symptoms - reports decreased depression - improved motivation and interest. Denies anxiety and irritability. Denies worry, rumination, and over thinking. Mood is better. Stating "I have felt progressively better". Feels like the Zoloft at 25mg  daily is helpful. Family doing well. Seeing therapist - Lennice Sites. Taking current medications as prescribed. Energy levels vary. Active, does not have a regular exercise routine. Enjoys some usual interests and activities. Married 23 years - 3 children. Parents local - grandmother. Spending time with family. Appetite adequate. Weight loss - 4 pounds.    Sleeps well most nights. Averages 4 to 6 hours - typically closer to 6 hours. Decreased daytime napping Focus and concentration difficulties. Completing tasks. Managing aspects of household. Works part-time - Financial trader. Denies SI or HI.  Denies AH or VH. Denies self harm. Denies substance use.  History of seasonal depression.  Previous medication trials: Celexa 10mg  daily   GAD-7    Flowsheet Row Office Visit from 10/07/2022 in [facility] [place]  Total GAD-7 Score 2      PHQ2-9    Flowsheet Row Office Visit from 10/07/2022 in [facility] [place] Office Visit from 05/22/2021 in [facility] [place] Office Visit from 07/17/2018 in [facility] [place] Office Visit from 03/18/2018 in [facility] [place] Office Visit from 11/09/2014 in [facility] [place]  PHQ-2 Total Score 1 0 2 3 1   PHQ-9 Total Score 5 3 6 8  --         Review of Systems:  Review of Systems  Musculoskeletal:  Negative for gait problem.  Neurological:  Negative for tremors.  Psychiatric/Behavioral:         Please refer to HPI    Medications: I have reviewed the patient's current medications.  Current Outpatient Medications  Medication Sig Dispense Refill   albuterol (PROVENTIL HFA) 108 (90 Base) MCG/ACT inhaler Inhale 2 puffs into the lungs every 6 (six) hours as needed for wheezing or shortness of breath. 1 Inhaler 3   cetirizine (ZYRTEC) 10 MG tablet Take 10 mg by mouth daily.     Cholecalciferol (VITAMIN D3 PO) Take 2,000 mg by mouth daily.      LUTEIN PO Take 1 capsule by mouth daily. (Patient not taking: Reported on 11/08/2022)     MAGNESIUM PO Take by mouth.     Omega-3 Fatty Acids (FISH OIL) 1000 MG CAPS Take by mouth.     Probiotic Product (PROBIOTIC PO) Take 1 tablet by mouth every other day.     PSYLLIUM HUSK PO Take 2 capsules by mouth daily.     rosuvastatin (CRESTOR) 10 MG tablet Take 1 tablet (10 mg total) by mouth daily. Please call [phone] to schedule a follow up appointment. 90 tablet 3   sertraline (ZOLOFT) 50 MG tablet Take 1 tablet (50 mg total) by mouth daily. 90 tablet 3   SPIRULINA PO Take 1 tablet by mouth daily. For food allergy prevention     Triamcinolone Acetonide (TRIAMCINOLONE 0.1 % CREAM : EUCERIN) CREA Apply 1 application topically 2 (two) times daily as needed for rash or  itching. 500 each 1   Turmeric Curcumin 500 MG CAPS Take by mouth.     Current Facility-Administered Medications  Medication Dose Route Frequency Provider Last Rate Last Admin   0.9 %  sodium chloride infusion  500 mL Intravenous Continuous Cirigliano, Vito V, DO        Medication Side Effects: None  Allergies:  Allergies  Allergen Reactions   Beef-Derived Products Diarrhea    Nausea and vomiting   Prochlorperazine Edisylate Shortness Of Breath    Compazine Tongue swelling   Prochlorperazine Edisylate Swelling     Swelling of throat   Tree Extract Anaphylaxis    Throat itchy, roof of mouth gets raw   Banana Itching and Nausea And Vomiting    Throat gets itchy   Beef (Bovine) Protein     Diarrhea and stomach cramps    Oxycodone-Acetaminophen     Vomiting   Permethrin Rash    Rash and fever   Statins Itching   Morphine And Codeine Itching   Other     Tree Nuts (can have peanuts and cashews) Pyrethrine (ingredient in lice products)- fever and rash   Oxycodone-Acetaminophen Nausea And Vomiting   Latex Hives and Rash    Rash    Past Medical History:  Diagnosis Date   ALLERGIC RHINITIS 05/31/2008   Anemia    ANXIETY 05/31/2008   ASTHMA 05/31/2008   Asthma    CHOLELITHIASIS 05/31/2008   High blood pressure    readings with stones   History of kidney stones    History of right breast biopsy 2017   HYPERLIPIDEMIA 05/31/2008   Kidney stone    NEPHROLITHIASIS, HX OF 05/31/2008    Past Medical History, Surgical history, Social history, and Family history were reviewed and updated as appropriate.   Please see review of systems for further details on the patient's review from today.   Objective:   Physical Exam:  There were no vitals taken for this visit.  Physical Exam Constitutional:      General: She is not in acute distress. Musculoskeletal:        General: No deformity.  Neurological:     Mental Status: She is alert and oriented to person, place, and time.     Coordination: Coordination normal.  Psychiatric:        Attention and Perception: Attention and perception normal. She does not perceive auditory or visual hallucinations.        Mood and Affect: Affect is not labile, blunt, angry or inappropriate.        Speech: Speech normal.        Behavior: Behavior normal.        Thought Content: Thought content normal. Thought content is not paranoid or delusional. Thought content does not include homicidal or suicidal ideation. Thought content does not include homicidal or  suicidal plan.        Cognition and Memory: Cognition and memory normal.        Judgment: Judgment normal.     Comments: Insight intact     Lab Review:     Component Value Date/Time   NA 135 10/07/2022 0932   K 4.1 10/07/2022 0932   CL 102 10/07/2022 0932   CO2 25 10/07/2022 0932   GLUCOSE 115 (H) 10/07/2022 0932   BUN 13 10/07/2022 0932   CREATININE 0.81 10/07/2022 0932   CALCIUM 9.9 10/07/2022 0932   PROT 7.6 10/07/2022 0932   ALBUMIN 4.6 10/07/2022 0932   AST 32 10/07/2022 0932  ALT 32 10/07/2022 0932   ALKPHOS 69 10/07/2022 0932   BILITOT 0.5 10/07/2022 0932   GFRNONAA 53 (L) 03/30/2017 0550   GFRAA >60 03/30/2017 0550       Component Value Date/Time   WBC 8.2 03/18/2018 1036   RBC 4.61 03/18/2018 1036   HGB 13.3 03/18/2018 1036   HCT 39.7 03/18/2018 1036   PLT 302.0 03/18/2018 1036   MCV 86.2 03/18/2018 1036   MCH 28.4 03/30/2017 0550   MCHC 33.5 03/18/2018 1036   RDW 14.0 03/18/2018 1036   LYMPHSABS 2.9 11/09/2014 0930   MONOABS 0.5 11/09/2014 0930   EOSABS 0.4 11/09/2014 0930   BASOSABS 0.0 11/09/2014 0930    No results found for: "POCLITH", "LITHIUM"   No results found for: "PHENYTOIN", "PHENOBARB", "VALPROATE", "CBMZ"   .res Assessment: Plan:    Plan:  PDMP reviewed  Continue Zoloft 25mg  daily   Considering ADD testing.  RTC 6 months  Patient advised to contact office with any questions, adverse effects, or acute worsening in signs and symptoms.  There are no diagnoses linked to this encounter.   Please see After Visit Summary for patient specific instructions.  Future Appointments  Date Time Provider Department Center  02/11/2023 10:00 AM Roston Grunewald, Thereasa Solo, NP [facility] None  02/19/2023 10:00 AM [name], [name] [facility] None  03/05/2023 10:00 AM [name], [name] [facility] None  03/19/2023 10:00 AM [name], [name] [facility] None    No orders of the defined types were placed in this  encounter.   -------------------------------

## 2023-02-19 ENCOUNTER — Ambulatory Visit (INDEPENDENT_AMBULATORY_CARE_PROVIDER_SITE_OTHER): Payer: BC Managed Care – PPO | Admitting: Psychology

## 2023-02-19 DIAGNOSIS — F411 Generalized anxiety disorder: Secondary | ICD-10-CM | POA: Diagnosis not present

## 2023-02-19 DIAGNOSIS — F3281 Premenstrual dysphoric disorder: Secondary | ICD-10-CM

## 2023-02-19 NOTE — Progress Notes (Signed)
[facility] Counselor/Therapist Progress Note  Patient ID: Angela Harrell, MRN: [id number]   Date: 02/19/23  Time Spent: 10:08 am -11:02 am : 54 Minutes  Treatment Type: Individual Therapy.  Reported Symptoms: depression and anxiety.   Mental Status Exam: Appearance:  Casual     Behavior: Appropriate  Motor: Normal  Speech/Language:  Clear and Coherent  Affect: Congruent  Mood: normal  Thought process: normal  Thought content:   WNL  Sensory/Perceptual disturbances:   WNL  Orientation: oriented to person, place, time/date, and situation  Attention: Good  Concentration: Good  Memory: WNL  Fund of knowledge:  Good  Insight:   Good  Judgment:  Good  Impulse Control: Good   Risk Assessment: Danger to Self:  No Self-injurious Behavior: No Danger to Others: No Duty to Warn:no Physical Aggression / Violence:No  Access to Firearms a concern: No  Gang Involvement:No   Subjective:   [name] participated from home, via video, is aware of the limitations of tele-sessions, and consented to treatment. Therapist participated from home office. Adisynn reviewed the events of the past week. Jaeline noted having a psychiatric consult but noted not being assessed for ADHD during this time and that this will be slated for her follow-up. She noted experiencing numerous transitions including moving her daughter to school which led to interpersonal stressors. She noted frustrations regarding getting her daughter an ESA letter. She noted anxiety regarding an upcoming family trip due to two of her children having difficulty resolving conflict. She noted her efforts to promote conflict resolution and set boundaries. She noted her continued efforts to support autonomy with her children and set and maintain boundaries regarding this. She noted feeling better, overall, since her dose of Zoloft was reduced. She noted her intent to work on finishing a task prior to starting another one.  Additionally, she noted working on setting time for herself to engage in self-care. Therapist encouraged Harley to set a time, daily, to engage in self-care. Keighan noted the benefits of engaging in this way, which therapist praised. Therapist validated and normalized Korynn's feelings and experience and provided supportive therapy. Therapist praised [name] for her positive thinking regarding engaging in self-care. A follow-up was scheduled for continued treatment.   Interventions: Interpersonal and CBT.   Diagnosis:  Generalized anxiety disorder  PMDD (premenstrual dysphoric disorder)  Psychiatric Treatment: Yes , [name] - [facility]. See chart for meds.    Treatment Plan:  Client Abilities/Strengths Porchia is self-aware and motivated for change.   Support System: Family and friends.   Client Treatment Preferences Outpatient Therapy.   Client Statement of Needs Ailsa would like to manage day-to-day stressors, process past events, process her father's cognitive decline and behavior in public,  being less avoidant, manage family stressors, improve motivation and organization, spend more time outside, and manage symptoms.   Treatment Level Weekly  Symptoms  Depression: Loss of interest, fluctuating sleep, lethargy, feeling bad about self, and difficulty concentrating.    (Status: maintained) Anxiety: Anxious, trouble relaxing, and irritability.    (Status: maintained)  Goals:   Rubee experiences symptoms of anxiety and depression   Target Date: 09/18/23 Frequency: Weekly  Progress: 0 Modality: individual    Therapist will provide referrals for additional resources as appropriate.  Therapist will provide psycho-education regarding Kalsey's diagnosis and corresponding treatment approaches and interventions. Licensed Clinical Social Worker, [place], [name] will support the patient's ability to achieve the goals identified. will employ CBT, BA,  Problem-solving, Solution Focused, Mindfulness,  coping skills, & other evidenced-based practices will be used to promote progress towards healthy functioning to help manage decrease symptoms associated with her diagnosis.   Reduce overall level, frequency, and intensity of the feelings of depression, anxiety and panic evidenced by decreased overall symptoms from 6 to 7 days/week to 0 to 1 days/week per client report for at least 3 consecutive months. Verbally express understanding of the relationship between feelings of depression, anxiety and their impact on thinking patterns and behaviors. Verbalize an understanding of the role that distorted thinking plays in creating fears, excessive worry, and ruminations.    [name] participated in the creation of the treatment plan)    [name], [name]

## 2023-03-05 ENCOUNTER — Ambulatory Visit (INDEPENDENT_AMBULATORY_CARE_PROVIDER_SITE_OTHER): Payer: BC Managed Care – PPO | Admitting: Psychology

## 2023-03-05 DIAGNOSIS — F3281 Premenstrual dysphoric disorder: Secondary | ICD-10-CM | POA: Diagnosis not present

## 2023-03-05 DIAGNOSIS — F411 Generalized anxiety disorder: Secondary | ICD-10-CM | POA: Diagnosis not present
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
## 2023-03-13 DIAGNOSIS — Z1231 Encounter for screening mammogram for malignant neoplasm of breast: Secondary | ICD-10-CM | POA: Diagnosis not present

## 2023-03-13 LAB — HM MAMMOGRAPHY

## 2023-03-14 ENCOUNTER — Encounter: Payer: Self-pay | Admitting: Family Medicine

## 2023-03-19 ENCOUNTER — Ambulatory Visit (INDEPENDENT_AMBULATORY_CARE_PROVIDER_SITE_OTHER): Payer: BC Managed Care – PPO | Admitting: Psychology

## 2023-03-19 DIAGNOSIS — F411 Generalized anxiety disorder: Secondary | ICD-10-CM | POA: Diagnosis not present

## 2023-03-19 DIAGNOSIS — F3281 Premenstrual dysphoric disorder: Secondary | ICD-10-CM

## 2023-03-19 NOTE — Progress Notes (Signed)
[facility] Counselor/Therapist Progress Note  Patient ID: Angela Harrell, MRN: [id number]   Date: 03/19/23  Time Spent: 10:07 am - 11:00 am : 53  Minutes  Treatment Type: Individual Therapy.  Reported Symptoms: depression and anxiety.   Mental Status Exam: Appearance:  Casual     Behavior: Appropriate  Motor: Normal  Speech/Language:  Clear and Coherent  Affect: Tearful  Mood: dysthymic  Thought process: normal  Thought content:   WNL  Sensory/Perceptual disturbances:   WNL  Orientation: oriented to person, place, time/date, and situation  Attention: Good  Concentration: Good  Memory: WNL  Fund of knowledge:  Good  Insight:   Good  Judgment:  Good  Impulse Control: Good   Risk Assessment: Danger to Self:  No Self-injurious Behavior: No Danger to Others: No Duty to Warn:no Physical Aggression / Violence:No  Access to Firearms a concern: No  Gang Involvement:No   Subjective:   Angela Harrell participated from home, via video, is aware of the limitations of tele-sessions, and consented to treatment. Therapist participated from home office. Angela Harrell reviewed the events of the past week.  She noted continued struggles in relation to interpersonal issues and noted this being stressful. She noted this occurring at her volunteering opportunity and noted this person being quite antagonistic. She noted being caught off guard. We processed this and discussed various ways to address this concern including assertiveness &  boundary setting. Therapist modeled this during the session. She noted having numerous friends who have been diagnosed with cancers and noted having  to get a mammogram as well. She noted awaiting results. She noted this being quite painful. She noted continued familial stressors, primarily with her father, who is in cognitive decline and has become more "paranoid". She noted the difficulty her mother has had managing this and noted her own boundaries to reduce  time spent together. She noted a friend's attempt to reconnect and Angela Harrell noted being assertive in relation to the previous breakdown of their relationship and the need to address this prior to consideration of rekindling their friendship. She noted her struggle to tell people to "not treat me like crap". She noted that she needs to "Trust herself more". She noted family history playing a part in her reaction as an adult. We will continue to process this going forward. Angela Harrell was engaged and motivated and expressed commitment towards goals.   Interventions: Interpersonal and CBT.   Diagnosis:  Generalized anxiety disorder  PMDD (premenstrual dysphoric disorder)  Psychiatric Treatment: Yes , Angela Harrell - [facility]. See chart for meds.    Treatment Plan:  Client Abilities/Strengths Angela Harrell is self-aware and motivated for change.   Support System: Family and friends.   Client Treatment Preferences Outpatient Therapy.   Client Statement of Needs Angela Harrell would like to manage day-to-day stressors, process past events, process her father's cognitive decline and behavior in public,  being less avoidant, manage family stressors, improve motivation and organization, spend more time outside, and manage symptoms.   Treatment Level Weekly  Symptoms  Depression: Loss of interest, fluctuating sleep, lethargy, feeling bad about self, and difficulty concentrating.    (Status: maintained) Anxiety: Anxious, trouble relaxing, and irritability.    (Status: maintained)  Goals:   Angela Harrell experiences symptoms of anxiety and depression   Target Date: 09/18/23 Frequency: Weekly  Progress: 10 Modality: individual    Therapist will provide referrals for additional resources as appropriate.  Therapist will provide psycho-education regarding Angela Harrell's diagnosis and corresponding treatment approaches and interventions. Licensed  Clinical Social Worker, [place], [name] will support the  patient's ability to achieve the goals identified. will employ CBT, BA, Problem-solving, Solution Focused, Mindfulness,  coping skills, & other evidenced-based practices will be used to promote progress towards healthy functioning to help manage decrease symptoms associated with her diagnosis.   Reduce overall level, frequency, and intensity of the feelings of depression, anxiety and panic evidenced by decreased overall symptoms from 6 to 7 days/week to 0 to 1 days/week per client report for at least 3 consecutive months. Verbally express understanding of the relationship between feelings of depression, anxiety and their impact on thinking patterns and behaviors. Verbalize an understanding of the role that distorted thinking plays in creating fears, excessive worry, and ruminations.    [name] participated in the creation of the treatment plan)    [name], [name]

## 2023-04-02 ENCOUNTER — Ambulatory Visit: Payer: BC Managed Care – PPO | Admitting: Psychology

## 2023-04-02 DIAGNOSIS — F411 Generalized anxiety disorder: Secondary | ICD-10-CM

## 2023-04-02 DIAGNOSIS — F3281 Premenstrual dysphoric disorder: Secondary | ICD-10-CM
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
## 2023-04-16 ENCOUNTER — Ambulatory Visit: Payer: BC Managed Care – PPO | Admitting: Psychology

## 2023-04-16 DIAGNOSIS — F418 Other specified anxiety disorders: Secondary | ICD-10-CM

## 2023-04-16 DIAGNOSIS — F3281 Premenstrual dysphoric disorder: Secondary | ICD-10-CM

## 2023-04-16 DIAGNOSIS — F411 Generalized anxiety disorder: Secondary | ICD-10-CM

## 2023-04-16 NOTE — Progress Notes (Signed)
[facility] Counselor/Therapist Progress Note  Patient ID: Angela Harrell, MRN: [id number]   Date: 04/16/23  Time Spent: 10:04 am -11:02 am :58 Minutes  Treatment Type: Individual Therapy.  Reported Symptoms: depression and anxiety.   Mental Status Exam: Appearance:  Casual     Behavior: Appropriate  Motor: Normal  Speech/Language:  Clear and Coherent  Affect: Appropriate  Mood: dysthymic  Thought process: normal  Thought content:   WNL  Sensory/Perceptual disturbances:   WNL  Orientation: oriented to person, place, time/date, and situation  Attention: Good  Concentration: Good  Memory: WNL  Fund of knowledge:  Good  Insight:   Good  Judgment:  Good  Impulse Control: Good   Risk Assessment: Danger to Self:  No Self-injurious Behavior: No Danger to Others: No Duty to Warn:no Physical Aggression / Violence:No  Access to Firearms a concern: No  Gang Involvement:No   Subjective:   Angela Harrell participated from home, via video, is aware of the limitations of tele-sessions, and consented to treatment. Therapist participated from home office. Angela Harrell reviewed the events of the past week. She noted feeling stressed by her volunteering role and noted a rising level of frustration regarding being blamed by others for issues that she is not responsible for and not under her preview. She noted "I know have issues, with men, that goes back to family history". She noted her husband expressing frustration towards her due her own frustration for not receiving acknowledgement of her efforts and expressing her frustration. She noted related stressors with people taking credit for her efforts. She noted difficulty managing this this and noted having to defend herself, noted being a people pleaser, and not make waves in adulthood and attributed this to childhood noting "anything I was any good at, wasn't acceptable". She noted this being a barrier to assertiveness in adulthood. We  worked on processing her level of discomfort. She noted being in the supportive role for others, "the fix it person", and that this could be a barrier to receiving support herself. She noted often being labeled as "crazy" or a "bridezilla" when people dislike an opinion or stance. She noted personalizing this. We explored this during the session.Therapist encouraged Angela Harrell to identify alternative ways to reframe these experiences. Angela Harrell was engaged and motivated during the session and expressed commitment towards goals. Therapist validated Angela Harrell's feelings and experience and provided supportive therapy.   Interventions: Interpersonal and CBT.   Diagnosis:  Generalized anxiety disorder  PMDD (premenstrual dysphoric disorder)  Other specified anxiety disorders  Psychiatric Treatment: Yes , Angela Harrell - [facility]. See chart for meds.    Treatment Plan:  Client Abilities/Strengths Angela Harrell is self-aware and motivated for change.   Support System: Family and friends.   Client Treatment Preferences Outpatient Therapy.   Client Statement of Needs Angela Harrell would like to manage day-to-day stressors, process past events, process her father's cognitive decline and behavior in public,  being less avoidant, manage family stressors, improve motivation and organization, spend more time outside, and manage symptoms.   Treatment Level Weekly  Symptoms  Depression: Loss of interest, fluctuating sleep, lethargy, feeling bad about self, and difficulty concentrating.    (Status: maintained) Anxiety: Anxious, trouble relaxing, and irritability.    (Status: maintained)  Goals:   Angela Harrell experiences symptoms of anxiety and depression   Target Date: 09/18/23 Frequency: Weekly  Progress: 10 Modality: individual    Therapist will provide referrals for additional resources as appropriate.  Therapist will provide psycho-education regarding Angela Harrell's diagnosis and  corresponding treatment  approaches and interventions. Licensed Clinical Social Worker, [place], [name] will support the patient's ability to achieve the goals identified. will employ CBT, BA, Problem-solving, Solution Focused, Mindfulness,  coping skills, & other evidenced-based practices will be used to promote progress towards healthy functioning to help manage decrease symptoms associated with her diagnosis.   Reduce overall level, frequency, and intensity of the feelings of depression, anxiety and panic evidenced by decreased overall symptoms from 6 to 7 days/week to 0 to 1 days/week per client report for at least 3 consecutive months. Verbally express understanding of the relationship between feelings of depression, anxiety and their impact on thinking patterns and behaviors. Verbalize an understanding of the role that distorted thinking plays in creating fears, excessive worry, and ruminations.    Angela Harrell participated in the creation of the treatment plan)    Angela Ovens, [name]

## 2023-04-30 ENCOUNTER — Ambulatory Visit: Payer: BC Managed Care – PPO | Admitting: Psychology

## 2023-04-30 DIAGNOSIS — F3281 Premenstrual dysphoric disorder: Secondary | ICD-10-CM

## 2023-04-30 DIAGNOSIS — F411 Generalized anxiety disorder: Secondary | ICD-10-CM

## 2023-04-30 NOTE — Progress Notes (Signed)
[facility] Counselor/Therapist Progress Note  Patient ID: Angela Harrell, MRN: [id number]   Date: 04/30/23  Time Spent: 10:04 am - 11:01  am : 57 Minutes  Treatment Type: Individual Therapy.  Reported Symptoms: depression and anxiety.   Mental Status Exam: Appearance:  Casual     Behavior: Appropriate  Motor: Normal  Speech/Language:  Clear and Coherent  Affect: Appropriate  Mood: dysthymic  Thought process: normal  Thought content:   WNL  Sensory/Perceptual disturbances:   WNL  Orientation: oriented to person, place, time/date, and situation  Attention: Good  Concentration: Good  Memory: WNL  Fund of knowledge:  Good  Insight:   Good  Judgment:  Good  Impulse Control: Good   Risk Assessment: Danger to Self:  No Self-injurious Behavior: No Danger to Others: No Duty to Warn:no Physical Aggression / Violence:No  Access to Firearms a concern: No  Gang Involvement:No   Subjective:   Angela Harrell participated from home, via video, is aware of the limitations of tele-sessions, and consented to treatment. Therapist participated from home office. Angela Harrell reviewed the events of the past week. She noted difficulty "keeping stressful thoughts out of my head". She noted these stressors include politics and interpersonal stressors. 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 We explored this during the session. She noted initially feeling awkward and uncomfortable. She noted working on not prioritizing the feelings of those who treat her poorly and to be "less people pleasing". She noted this being a necessity due to her childhood and having a father who had difficulty managing his frustration and communicating needs and expectations. She noted her father's worsening dementia being a trigger due impatience and poor frustration tolerance. She noted a need to reduce time spent with her father as a result. She noted "there is not much affection left" and noted  not wanting to "touch him" due to this. She noted interactions being "very stressful". We discussed ways to manage this distress. Angela Harrell noted a need to shorten visits, even during holidays, and to allow her children to leave early should the need arise. We worked on continuing to identify boundaries to manage interpersonal stressors including identifying responsibility in stressors, setting boundaries for self in regards to "solving" other people's problems, and being more assertive. She noted difficulty accepting complements and noted "it was ingrained in me to be modest". She noted difficulty accepting gestures. We explored this during the session and Angela Harrell noted her attempts to refused the gestures and offer her own gestures. Therapist encouraged Angela Harrell to continue to identify boundaries for others and to work on accepting compliments and gestures while challenging negative self-talk. Angela Harrell was engaged and motivated during the session. She expressed commitment towards goals. Therapist praised Angela Harrell and provided supportive therapy.    Interventions: Interpersonal and CBT.   Diagnosis:  Generalized anxiety disorder  PMDD (premenstrual dysphoric disorder)  Psychiatric Treatment: Yes , [name] - [facility]. See chart for meds.    Treatment Plan:  Client Abilities/Strengths Angela Harrell is self-aware and motivated for change.   Support System: Family and friends.   Client Treatment Preferences Outpatient Therapy.   Client Statement of Needs Angela Harrell would like to manage day-to-day stressors, process past events, process her father's cognitive decline and behavior in public,  being less avoidant, manage family stressors, improve motivation and organization, spend more time outside, and manage symptoms.   Treatment Level Weekly  Symptoms  Depression: Loss of interest, fluctuating sleep, lethargy, feeling bad about self, and difficulty  concentrating.    (Status:  maintained) Anxiety: Anxious, trouble relaxing, and irritability.    (Status: maintained)  Goals:   Angela Harrell experiences symptoms of anxiety and depression   Target Date: 09/18/23 Frequency: Weekly  Progress: 10 Modality: individual    Therapist will provide referrals for additional resources as appropriate.  Therapist will provide psycho-education regarding Angela Harrell's diagnosis and corresponding treatment approaches and interventions. Licensed Clinical Social Worker, [place], [name] will support the patient's ability to achieve the goals identified. will employ CBT, BA, Problem-solving, Solution Focused, Mindfulness,  coping skills, & other evidenced-based practices will be used to promote progress towards healthy functioning to help manage decrease symptoms associated with her diagnosis.   Reduce overall level, frequency, and intensity of the feelings of depression, anxiety and panic evidenced by decreased overall symptoms from 6 to 7 days/week to 0 to 1 days/week per client report for at least 3 consecutive months. Verbally express understanding of the relationship between feelings of depression, anxiety and their impact on thinking patterns and behaviors. Verbalize an understanding of the role that distorted thinking plays in creating fears, excessive worry, and ruminations.    Angela Harrell participated in the creation of the treatment plan)    Angela Ovens, [name]

## 2023-05-14 ENCOUNTER — Ambulatory Visit: Payer: BC Managed Care – PPO | Admitting: Psychology

## 2023-05-14 DIAGNOSIS — F411 Generalized anxiety disorder: Secondary | ICD-10-CM

## 2023-05-14 DIAGNOSIS — F3281 Premenstrual dysphoric disorder: Secondary | ICD-10-CM

## 2023-05-14 NOTE — Progress Notes (Signed)
Angela Harrell/Therapist Progress Note  Patient ID: Angela Harrell, MRN: [id number]   Date: 05/14/23  Time Spent: 10:05 am - 11:01 am : 56 Minutes  Treatment Type: Individual Therapy.  Reported Symptoms: depression and anxiety.   Mental Status Exam: Appearance:  Casual     Behavior: Appropriate  Motor: Normal  Speech/Language:  Clear and Coherent  Affect: Appropriate  Mood: dysthymic  Thought process: normal  Thought content:   WNL  Sensory/Perceptual disturbances:   WNL  Orientation: oriented to person, place, time/date, and situation  Attention: Good  Concentration: Good  Memory: WNL  Fund of knowledge:  Good  Insight:   Good  Judgment:  Good  Impulse Control: Good   Risk Assessment: Danger to Self:  No Self-injurious Behavior: No Danger to Others: No Duty to Warn:no Physical Aggression / Violence:No  Access to Firearms a concern: No  Gang Involvement:No   Subjective:   Angela Harrell participated from home, via video, is aware of the limitations of tele-sessions, and consented to treatment. Therapist participated from home office. Angela Harrell reviewed the events of the past week. She noted working on setting boundaries with her grandmother who is not typically respectful of boundaries regarding time together and politics. She noted her grandmother's attempts, albeit unsuccessful, to be respectful of said boundaries. She noted anxiety regarding the upcoming holidays and having to spent time with her father and grandmother due to interpersonal stressors and her father's worsening alzheimers.  She described her father and grandmother are high needs. She noted anxiety regarding her daughter's upcoming travel due to restrictions on psychotropic medications and stimulants which her daughter is prescribed both.We explored this during the session and ways to problem-solve this. She noted following up with a previous goal of attempting to be mindful and highlight  positive traits of her father. We processed this during the session. She noted that her father's general approach and communication style being abrasive and noted that her father affected how she saw her mother in childhood due to consistent negative messages. She noted feeling physically tense in her father's presence. She noted working on communicating her needs to her husband about not being attentive during conversation and the effect of this on her and their relationship. Therapist praised Angela Harrell for her effort to provide the feedback and her effort in the session, as well. Therapist validated and normalized Angela Harrell's feelings and experience and provided supportive therapy.   Interventions: Interpersonal and CBT.   Diagnosis:  Generalized anxiety disorder  PMDD (premenstrual dysphoric disorder)  Psychiatric Treatment: Yes , Angela Harrell - [facility]. See chart for meds.    Treatment Plan:  Client Abilities/Strengths Angela Harrell is self-aware and motivated for change.   Support System: Family and friends.   Client Treatment Preferences Outpatient Therapy.   Client Statement of Needs Angela Harrell would like to manage day-to-day stressors, process past events, process her father's cognitive decline and behavior in public,  being less avoidant, manage family stressors, improve motivation and organization, spend more time outside, and manage symptoms.   Treatment Level Weekly  Symptoms  Depression: Loss of interest, fluctuating sleep, lethargy, feeling bad about self, and difficulty concentrating.    (Status: maintained) Anxiety: Anxious, trouble relaxing, and irritability.    (Status: maintained)  Goals:   Angela Harrell experiences symptoms of anxiety and depression   Target Date: 09/18/23 Frequency: Weekly  Progress: 10 Modality: individual    Therapist will provide referrals for additional resources as appropriate.  Therapist will provide psycho-education regarding [name]'s  diagnosis  and corresponding treatment approaches and interventions. Licensed Clinical Social Worker, [place], [name] will support the patient's ability to achieve the goals identified. will employ CBT, BA, Problem-solving, Solution Focused, Mindfulness,  coping skills, & other evidenced-based practices will be used to promote progress towards healthy functioning to help manage decrease symptoms associated with her diagnosis.   Reduce overall level, frequency, and intensity of the feelings of depression, anxiety and panic evidenced by decreased overall symptoms from 6 to 7 days/week to 0 to 1 days/week per client report for at least 3 consecutive months. Verbally express understanding of the relationship between feelings of depression, anxiety and their impact on thinking patterns and behaviors. Verbalize an understanding of the role that distorted thinking plays in creating fears, excessive worry, and ruminations.    Angela Harrell participated in the creation of the treatment plan)    Angela Ovens, [name]

## 2023-06-11 ENCOUNTER — Ambulatory Visit: Payer: BC Managed Care – PPO | Admitting: Psychology

## 2023-06-11 DIAGNOSIS — F3281 Premenstrual dysphoric disorder: Secondary | ICD-10-CM

## 2023-06-11 DIAGNOSIS — F411 Generalized anxiety disorder: Secondary | ICD-10-CM

## 2023-06-11 NOTE — Progress Notes (Signed)
[facility] Counselor/Therapist Progress Note  Patient ID: Angela Harrell, MRN: [id number]   Date: 06/11/23  Time Spent: 10:16 am - 11:01 am : 45 Minutes  Treatment Type: Individual Therapy.  Reported Symptoms: depression and anxiety.   Mental Status Exam: Appearance:  Casual     Behavior: Appropriate  Motor: Normal  Speech/Language:  Clear and Coherent  Affect: Appropriate  Mood: normal  Thought process: normal  Thought content:   WNL  Sensory/Perceptual disturbances:   WNL  Orientation: oriented to person, place, time/date, and situation  Attention: Good  Concentration: Good  Memory: WNL  Fund of knowledge:  Good  Insight:   Good  Judgment:  Good  Impulse Control: Good   Risk Assessment: Danger to Self:  No Self-injurious Behavior: No Danger to Others: No Duty to Warn:no Physical Aggression / Violence:No  Access to Firearms a concern: No  Gang Involvement:No   Subjective:   Angela Harrell participated from home, via video, is aware of the limitations of tele-sessions, and consented to treatment. Therapist participated from home office. Angela Harrell reviewed the events of the past week. She noted the stress of care-taking for the family pets. She noted her husband recently beginning counseling due to work stressors and difficulty with concentrating. She noted some financial pressures due to difficulty with travel plans that resulted in unexpected costs. She noted upcoming plans for travel and the stressors of this in regards to planning. She noted numerous tasks that need to be completed prior to her travel. She noted an estranged friend reaching out but being inconsistent with contact. We worked on processing this and identifying her feelings of frustration. We worked on identifying ways to manage this going forward. She noted stressors related to communicating with her friend. We worked on exploring her concerns and ways to address them via clear communication and  assertiveness. Therapist validated Angela Harrell's feelings and experience and provided supportive therapy.   Interventions: Interpersonal and CBT.   Diagnosis:  Generalized anxiety disorder  PMDD (premenstrual dysphoric disorder)  Psychiatric Treatment: Yes , [name] - [facility]. See chart for meds.    Treatment Plan:  Client Abilities/Strengths Tanice is self-aware and motivated for change.   Support System: Family and friends.   Client Treatment Preferences Outpatient Therapy.   Client Statement of Needs Tennelle would like to manage day-to-day stressors, process past events, process her father's cognitive decline and behavior in public,  being less avoidant, manage family stressors, improve motivation and organization, spend more time outside, and manage symptoms.   Treatment Level Weekly  Symptoms  Depression: Loss of interest, fluctuating sleep, lethargy, feeling bad about self, and difficulty concentrating.    (Status: maintained) Anxiety: Anxious, trouble relaxing, and irritability.    (Status: maintained)  Goals:   Tavon experiences symptoms of anxiety and depression   Target Date: 09/18/23 Frequency: Weekly  Progress: 10 Modality: individual    Therapist will provide referrals for additional resources as appropriate.  Therapist will provide psycho-education regarding Tekeya's diagnosis and corresponding treatment approaches and interventions. Licensed Clinical Social Worker, [place], [name] will support the patient's ability to achieve the goals identified. will employ CBT, BA, Problem-solving, Solution Focused, Mindfulness,  coping skills, & other evidenced-based practices will be used to promote progress towards healthy functioning to help manage decrease symptoms associated with her diagnosis.   Reduce overall level, frequency, and intensity of the feelings of depression, anxiety and panic evidenced by decreased overall symptoms from 6 to 7  days/week to 0 to 1  days/week per client report for at least 3 consecutive months. Verbally express understanding of the relationship between feelings of depression, anxiety and their impact on thinking patterns and behaviors. Verbalize an understanding of the role that distorted thinking plays in creating fears, excessive worry, and ruminations.    Angela Harrell participated in the creation of the treatment plan)    Angela Ovens, [name]

## 2023-07-07 ENCOUNTER — Ambulatory Visit: Payer: BC Managed Care – PPO | Admitting: Adult Health

## 2023-07-09 ENCOUNTER — Ambulatory Visit: Payer: BC Managed Care – PPO | Admitting: Psychology

## 2023-07-09 DIAGNOSIS — F411 Generalized anxiety disorder: Secondary | ICD-10-CM | POA: Diagnosis not present

## 2023-07-09 DIAGNOSIS — F3281 Premenstrual dysphoric disorder: Secondary | ICD-10-CM | POA: Diagnosis not present

## 2023-07-09 NOTE — Progress Notes (Signed)
 [facility] Counselor/Therapist Progress Note  Patient ID: Angela Harrell, MRN: [id number]   Date: 07/09/23  Time Spent: 10:06 am - 11:01 am : 55 Minutes  Treatment Type: Individual Therapy.  Reported Symptoms: depression and anxiety.   Mental Status Exam: Appearance:  Casual     Behavior: Appropriate  Motor: Normal  Speech/Language:  Clear and Coherent  Affect: Appropriate  Mood: normal  Thought process: normal  Thought content:   WNL  Sensory/Perceptual disturbances:   WNL  Orientation: oriented to person, place, time/date, and situation  Attention: Good  Concentration: Good  Memory: WNL  Fund of knowledge:  Good  Insight:   Good  Judgment:  Good  Impulse Control: Good   Risk Assessment: Danger to Self:  No Self-injurious Behavior: No Danger to Others: No Duty to Warn:no Physical Aggression / Violence:No  Access to Firearms a concern: No  Gang Involvement:No   Subjective:   Angela Harrell participated from home, via video, is aware of the limitations of tele-sessions, and consented to treatment. Therapist participated from home office. Angela Harrell reviewed the events of the past week. She noted recently traveling and noted being sick. She reflected on her recent travel experience. She noted her daughter's current travel and noted relief of her worries regarding her daughter traveling without the family. She noted that during the family travel, it was stressful due to her husband during this experience due to wanting to take the lead and not allowing others to navigate the experience. She noted that she and her husband discussed this to address these concerns. She noted struggles during the trip due to dealing with an antagonistic parent on the school trip and having to set boundaries and disengage. Therapist praised Angela for this effort.  She noted her father's quick cognitive decline. She noted worry that her mother is overwhelmed with this and would benefit from  help, although she is resistant. She noted her attempts to provide advice to her mother regarding this. We worked on processing this and ways to provide additional support. She is working on being more social  and making friends. Therapist praised Angela Harrell for this effort to be more social. She noted her hope to work on spending time with her father while managing her frustrations. We worked on reviewing coping skills and boundaries. Angela Harrell was engaged and motivated during the session. She expressed commitment towards our goals. Therapist praised Angela Harrell and provided supportive therapy.  Interventions: Interpersonal and CBT.   Diagnosis:  Generalized anxiety disorder  PMDD (premenstrual dysphoric disorder)  Psychiatric Treatment: Yes , Angela Harrell - [facility]. See chart for meds.    Treatment Plan:  Client Abilities/Strengths Angela Harrell is self-aware and motivated for change.   Support System: Family and friends.   Client Treatment Preferences Outpatient Therapy.   Client Statement of Needs Angela Harrell would like to manage day-to-day stressors, process past events, process her father's cognitive decline and behavior in public,  being less avoidant, manage family stressors, improve motivation and organization, spend more time outside, and manage symptoms.   Treatment Level Weekly  Symptoms  Depression: Loss of interest, fluctuating sleep, lethargy, feeling bad about self, and difficulty concentrating.    (Status: maintained) Anxiety: Anxious, trouble relaxing, and irritability.    (Status: maintained)  Goals:   Izzabell experiences symptoms of anxiety and depression   Target Date: 09/18/23 Frequency: Weekly  Progress: 10 Modality: individual    Therapist will provide referrals for additional resources as appropriate.  Therapist will provide psycho-education regarding Karrine's diagnosis  and corresponding treatment approaches and interventions. Licensed Clinical Social Worker,  [place], [name] will support the patient's ability to achieve the goals identified. will employ CBT, BA, Problem-solving, Solution Focused, Mindfulness,  coping skills, & other evidenced-based practices will be used to promote progress towards healthy functioning to help manage decrease symptoms associated with her diagnosis.   Reduce overall level, frequency, and intensity of the feelings of depression, anxiety and panic evidenced by decreased overall symptoms from 6 to 7 days/week to 0 to 1 days/week per client report for at least 3 consecutive months. Verbally express understanding of the relationship between feelings of depression, anxiety and their impact on thinking patterns and behaviors. Verbalize an understanding of the role that distorted thinking plays in creating fears, excessive worry, and ruminations.    [name] participated in the creation of the treatment plan)    [name], [name]

## 2023-07-18 ENCOUNTER — Ambulatory Visit: Payer: BC Managed Care – PPO | Admitting: Family Medicine

## 2023-07-18 VITALS — BP 130/82 | HR 103 | Temp 99.5°F | Wt 210.0 lb

## 2023-07-18 DIAGNOSIS — J020 Streptococcal pharyngitis: Secondary | ICD-10-CM

## 2023-07-18 DIAGNOSIS — R0989 Other specified symptoms and signs involving the circulatory and respiratory systems: Secondary | ICD-10-CM

## 2023-07-18 DIAGNOSIS — J4520 Mild intermittent asthma, uncomplicated: Secondary | ICD-10-CM

## 2023-07-18 LAB — POCT INFLUENZA A/B
Influenza A, POC: NEGATIVE
Influenza B, POC: NEGATIVE

## 2023-07-18 LAB — POCT RAPID STREP A (OFFICE): Rapid Strep A Screen: POSITIVE — AB

## 2023-07-18 LAB — POC COVID19 BINAXNOW: SARS Coronavirus 2 Ag: NEGATIVE

## 2023-07-18 MED ORDER — CEFUROXIME AXETIL 500 MG PO TABS: 500.0000 mg | ORAL_TABLET | Freq: Two times a day (BID) | ORAL | 0 refills | Status: AC

## 2023-07-18 MED ORDER — ALBUTEROL SULFATE HFA 108 (90 BASE) MCG/ACT IN AERS
2.0000 | INHALATION_SPRAY | Freq: Four times a day (QID) | RESPIRATORY_TRACT | 0 refills | Status: DC | PRN
Start: 2023-07-18 — End: 2024-01-09

## 2023-07-18 NOTE — Addendum Note (Signed)
Addended by: Carola Rhine on: 07/18/2023 11:30 AM   Modules accepted: Orders

## 2023-07-18 NOTE — Progress Notes (Signed)
   Subjective:    Patient ID: Angela Harrell, female    DOB: 1975-12-12, 48 y.o.   MRN: [id number]  HPI Here for 10 days of fever, PND, ST, and a dry cough. She is taking Mucinex DM.    Review of Systems  Constitutional:  Positive for fever.  HENT:  Positive for congestion, postnasal drip and sore throat. Negative for ear pain and sinus pressure.   Eyes: Negative.   Respiratory:  Positive for cough. Negative for shortness of breath and wheezing.        Objective:   Physical Exam Constitutional:      Appearance: Normal appearance.  HENT:     Right Ear: Tympanic membrane, ear canal and external ear normal.     Left Ear: Tympanic membrane, ear canal and external ear normal.     Nose: Nose normal.     Mouth/Throat:     Pharynx: Oropharynx is clear.  Eyes:     Conjunctiva/sclera: Conjunctivae normal.  Pulmonary:     Effort: Pulmonary effort is normal.     Breath sounds: Normal breath sounds.  Lymphadenopathy:     Cervical: No cervical adenopathy.  Neurological:     Mental Status: She is alert.           Assessment & Plan:  Strep pharyngitis, treat with 10 days of Cefuroxime.  Gershon Crane, MD

## 2023-07-21 ENCOUNTER — Ambulatory Visit: Payer: Self-pay | Admitting: Family Medicine

## 2023-07-21 ENCOUNTER — Ambulatory Visit: Payer: BC Managed Care – PPO | Admitting: Family Medicine

## 2023-07-21 ENCOUNTER — Telehealth: Payer: Self-pay

## 2023-07-21 ENCOUNTER — Encounter: Payer: Self-pay | Admitting: Family Medicine

## 2023-07-21 ENCOUNTER — Other Ambulatory Visit: Payer: Self-pay | Admitting: Family Medicine

## 2023-07-21 VITALS — BP 120/76 | HR 95 | Temp 98.6°F | Wt 206.0 lb

## 2023-07-21 DIAGNOSIS — J40 Bronchitis, not specified as acute or chronic: Secondary | ICD-10-CM

## 2023-07-21 MED ORDER — DOXYCYCLINE HYCLATE 100 MG PO TABS: 100.0000 mg | ORAL_TABLET | Freq: Two times a day (BID) | ORAL | 0 refills | Status: DC

## 2023-07-21 MED ORDER — HYDROCODONE BIT-HOMATROP MBR 5-1.5 MG/5ML PO SOLN
5.0000 mL | ORAL | 0 refills | Status: DC | PRN
Start: 2023-07-21 — End: 2023-07-22

## 2023-07-21 NOTE — Telephone Encounter (Signed)
Copied from CRM [id number]. Topic: Clinical - Medication Refill >> Jul 21, 2023  3:53 PM Sim Boast F wrote: Most Recent Primary Care Visit:  Provider: [name], [name]  Department: [facility]  Visit Type: OFFICE VISIT  Date: 10/07/2022  Medication: HYDROcodone bit-homatropine (HYCODAN) 5-1.5 MG/5ML syrup [[id number]]  Has the patient [facility] their [facility]? [facility] pt needs med resent to [place] location    (Agent: If no, request that the patient contact the pharmacy for the refill. If patient does not wish to contact the pharmacy document the reason why and proceed with request.) (Agent: If yes, when and what did the pharmacy advise?)  Is this the correct pharmacy for this prescription? Yes If no, delete pharmacy and type the correct one.  This is the patient's preferred pharmacy:     [id number] [place] [place] - [address] [place] [address] Phone: [phone] Fax: [phone]   Has the prescription been filled recently? Yes  Is the patient out of the medication? Yes  Has the patient been seen for an appointment in the last year OR does the patient have an upcoming appointment? Yes  Can we respond through MyChart? Yes  Agent: Please be advised that Rx refills may take up to 3 business days. We ask that you follow-up with your pharmacy.

## 2023-07-21 NOTE — Telephone Encounter (Signed)
Message routed to Dr Clent Ridges for advise

## 2023-07-21 NOTE — Telephone Encounter (Signed)
Agent calling to inquire about status of pt's Hydrocodone refill being sent to alternate pharmacy. This RN advised the message has been routed to the provider and is awaiting authorization. Agent to advise pt.   Copied from CRM [id number]. Topic: Clinical - Prescription Issue >> Jul 21, 2023  4:29 PM Clayton Bibles wrote: Reason for CRM: (HYDROcodone - patient needs [facility] [place] [facility] please) Husband is at [facility] [place] and the prescription is not there. Please call husband [phone] Reason for Disposition . [1] Follow-up call to recent contact AND [2] information only call, no triage required  Answer Assessment - Initial Assessment Questions 1. REASON FOR CALL or QUESTION: "What is your reason for calling today?" or "How can I best help you?" or "What question do you have that I can help answer?"     Has Hydrocodone refill been sent to [facility] [place] yet?  Protocols used: Information Only Call - No Triage-A-AH

## 2023-07-21 NOTE — Telephone Encounter (Signed)
  Chief Complaint: worsening on antibiotic for strep Symptoms: cough, fever, vomiting, fatigue Frequency: since Friday, started antibiotic Pertinent Negatives: Patient denies dehydration, sore throat Disposition: [] ED /[] Urgent Care (no appt availability in office) / [x] Appointment(In office/virtual)/ []  [facility]/ [] Home Care/ [] Refused Recommended Disposition /[]  Mobile Bus/ []  Follow-up with PCP Additional Notes: Patient agreeable to come for office visit for reevaluation. Patient states she started her ceftin on Friday after being diagnosed with strep. Patient states she does not have a sore throat but complains of 2-3 episodes of vomiting in the past 24 hours. Patient states she has been able to keep down fluid and her antibiotics.   Copied from CRM [id number]. Topic: Clinical - Red Word Triage >> Jul 21, 2023  8:13 AM Larwance Sachs wrote: Red Word that prompted transfer to Nurse Triage: Patient was treated for strep throat Friday and after stating antibiotics patient has had persistent coughing, on and off fever and vomiting multiple times a day over this past weekend Reason for Disposition  [1] Taking antibiotic > 48 hours (2 days) for strep throat AND [2] fever persists  Answer Assessment - Initial Assessment Questions 1. SYMPTOM: "What's the main symptom you're concerned about?" (e.g., fever, difficulty swallowing, sore throat)     Cough, fever, vomiting.  2. ANTIBIOTIC: "What antibiotic are you taking?" "How many times a day?"     Ceftin 500mg  bid.  3. ONSET: "When was the antibiotic started?"     07/18/23.  4. THROAT PAIN:  "How bad is the sore throat?" (Scale 1-10; mild, moderate or severe)   - MILD (1-3):  Doesn't interfere with eating or normal activities.   - MODERATE (4-7): Interferes with eating some solids and normal activities.   - SEVERE (8-10):  Excruciating pain, interferes with most normal activities.   - SEVERE WITH DYSPHAGIA (10): Can't swallow  liquids, drooling.     0/10.  5. FEVER: "Do you have a fever?" If Yes, ask: "What is your temperature, how was it measured, and when did it start?"     100.0 temporal at home this morning. Husband states highest has been 102 over the weekend.  6. OTHER SYMPTOMS: "Do you have any other symptoms?" (e.g., rash)     Coughing and vomiting; redness and irritation noted around her navel area on her abdomen but states she has been applying lotion and it appears improved.  7. BETTER-SAME-WORSE: "Are you getting better, staying the same, or getting worse compared to the day you started the antibiotics?"     Felt a little better at first but states then she began vomiting and feels worse.  Protocols used: Strep Throat Infection on Antibiotic Follow-up Call-[facility]

## 2023-07-21 NOTE — Telephone Encounter (Unsigned)
Copied from CRM [id number]. Topic: Clinical - Prescription Issue >> Jul 21, 2023  3:56 PM Sim Boast F wrote: Reason for CRM[facility] HY[facility] - patient needs [facility] [place] [facility] asap please

## 2023-07-21 NOTE — Telephone Encounter (Signed)
Copied from CRM [id number]. Topic: Clinical - Prescription Issue >> Jul 21, 2023  3:36 PM Prudencio Pair wrote: Reason for CRM: Patient called in stating that the prescription that Dr. Clent Ridges prescribed to her today, HYDROcodone bit-homatropine (HYCODAN) 5-1.5 MG/5ML syrup, will not be available until Friday at her pharmacy. She stated she will need the medication before then. Advised pt to check with other pharmacies to see who has it in stock so that we can send prescription there. Also advised pt that she can call her pharmacy back & have them to check other pharmacies as well.

## 2023-07-21 NOTE — Telephone Encounter (Signed)
Message sent to Dr Fry for advise 

## 2023-07-21 NOTE — Progress Notes (Signed)
   Subjective:    Patient ID: Angela Harrell, female    DOB: 18-Feb-1976, 48 y.o.   MRN: [id number]  HPI Here for changing upper respiratory symptoms. She was seen here on 07-18-23 for a ST and a dry cough, and she tested positive for strep. She was treated with Cefuroxime. After several days she started to feel better,  and the ST resolved. Then about a week later she developed chest congestion, fever, and a deep cough that produces yellow sputum. No SOB.    Review of Systems  Constitutional:  Positive for fever.  HENT:  Positive for congestion. Negative for ear pain, postnasal drip, sinus pain and sore throat.   Eyes: Negative.   Respiratory:  Positive for cough and chest tightness. Negative for shortness of breath and wheezing.        Objective:   Physical Exam Constitutional:      Comments: Coughing frequently   HENT:     Right Ear: Tympanic membrane, ear canal and external ear normal.     Left Ear: Tympanic membrane, ear canal and external ear normal.     Nose: Nose normal.     Mouth/Throat:     Pharynx: Oropharynx is clear.  Eyes:     Conjunctiva/sclera: Conjunctivae normal.  Pulmonary:     Effort: Pulmonary effort is normal.     Breath sounds: Rhonchi present. No wheezing or rales.  Lymphadenopathy:     Cervical: No cervical adenopathy.  Neurological:     Mental Status: She is alert.           Assessment & Plan:  She appears to have gotten over the strep pharyngitis, but then she developed a bronchitis. We will treat this with 10 days of Doxycycline. Add Hycodan syrup as needed.  Gershon Crane, MD

## 2023-07-21 NOTE — Telephone Encounter (Signed)
Noted  

## 2023-07-22 MED ORDER — HYDROCODONE BIT-HOMATROP MBR 5-1.5 MG/5ML PO SOLN: 5.0000 mL | ORAL | 0 refills | Status: DC | PRN

## 2023-07-22 NOTE — Addendum Note (Signed)
Addended by: Gershon Crane A on: 07/22/2023 04:57 PM   Modules accepted: Orders

## 2023-07-22 NOTE — Telephone Encounter (Signed)
I sent [facility] to [facility]

## 2023-07-22 NOTE — Telephone Encounter (Signed)
The RX for the hydrocodone cough syrup was sent to [facility] as we agreed. She needs to pick it up there, we cannot transfer this to another pharmacy

## 2023-07-22 NOTE — Telephone Encounter (Signed)
Please see note below.  Copied from CRM [id number]. Topic: Clinical - Prescription Issue >> Jul 22, 2023  8:22 AM Gurney Maxin H wrote: Reason for CRM: Patients husband states original prescription for the HYDROcodone bit-homatropine (HYCODAN) 5-1.5 MG/5ML syrup but that pharmacy didn't have it and states a new prescription needs to be sent to a different pharmacy since it is a controlled substance [id number] [place], [place] - [address] AT [place] [address] Phone: [phone] Fax: [phone]

## 2023-07-23 ENCOUNTER — Ambulatory Visit: Payer: BC Managed Care – PPO | Admitting: Psychology

## 2023-07-23 DIAGNOSIS — F411 Generalized anxiety disorder: Secondary | ICD-10-CM | POA: Diagnosis not present

## 2023-07-23 DIAGNOSIS — F3281 Premenstrual dysphoric disorder: Secondary | ICD-10-CM

## 2023-07-23 NOTE — Progress Notes (Signed)
[facility] Counselor/Therapist Progress Note  Patient ID: Tazia Neth, MRN: [id number]   Date: 07/23/23  Time Spent: 10:08 am - 10:59 am :51 Minutes  Treatment Type: Individual Therapy.  Reported Symptoms: depression and anxiety.   Mental Status Exam: Appearance:  Casual     Behavior: Appropriate  Motor: Normal  Speech/Language:  Clear and Coherent  Affect: Appropriate  Mood: normal  Thought process: normal  Thought content:   WNL  Sensory/Perceptual disturbances:   WNL  Orientation: oriented to person, place, time/date, and situation  Attention: Good  Concentration: Good  Memory: WNL  Fund of knowledge:  Good  Insight:   Good  Judgment:  Good  Impulse Control: Good   Risk Assessment: Danger to Self:  No Self-injurious Behavior: No Danger to Others: No Duty to Warn:no Physical Aggression / Violence:No  Access to Firearms a concern: No  Gang Involvement:No   Subjective:   [name] participated from home, via video, is aware of the limitations of tele-sessions, and consented to treatment. Therapist participated from home office. We switched to phone due to technical difficulties. Over 50% of the session was via video. Delsie reviewed the events of the past week. She noted feeling sick this past week. She noted feeling emotional due to feeling sick and not being able to sleep well. She noted not eating well and experiencing gi issues. She noted difficulty functioning as normal and noted this affecting her mood. She noted her effort to relax and work on recovering. 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 She noted feeling gratitude for people helping at this time but noted it is often difficult to feel worthy of this. We worked on exploring this. Therapist validated Xyla's  experience, provided supportive therapy, and encouraged self-care.   Interventions: Interpersonal and CBT.   Diagnosis:  Generalized anxiety disorder  PMDD (premenstrual dysphoric disorder)  Psychiatric Treatment: Yes , [name]. See chart for meds.    Treatment Plan:  Client Abilities/Strengths Mellody is self-aware and motivated for change.   Support System: Family and friends.   Client Treatment Preferences Outpatient Therapy.   Client Statement of Needs Ellys would like to manage day-to-day stressors, process past events, process her father's cognitive decline and behavior in public,  being less avoidant, manage family stressors, improve motivation and organization, spend more time outside, and manage symptoms.   Treatment Level Weekly  Symptoms  Depression: Loss of interest, fluctuating sleep, lethargy, feeling bad about self, and difficulty concentrating.    (Status: maintained) Anxiety: Anxious, trouble relaxing, and irritability.    (Status: maintained)  Goals:   Michaila experiences symptoms of anxiety and depression   Target Date: 09/18/23 Frequency: Weekly  Progress: 10 Modality: individual    Therapist will provide referrals for additional resources as appropriate.  Therapist will provide psycho-education regarding Shayli's diagnosis and corresponding treatment approaches and interventions. Licensed Clinical Social Worker, [place], [name] will support the patient's ability to achieve the goals identified. will employ CBT, BA, Problem-solving, Solution Focused, Mindfulness,  coping skills, & other evidenced-based practices will be used to promote progress towards healthy functioning to help manage decrease symptoms associated with her diagnosis.   Reduce overall level, frequency, and intensity of the feelings of depression, anxiety and panic evidenced by decreased overall symptoms from 6 to 7 days/week to  0 to 1 days/week per  client report for at least 3 consecutive months. Verbally express understanding of the relationship between feelings of depression, anxiety and their impact on thinking patterns and behaviors. Verbalize an understanding of the role that distorted thinking plays in creating fears, excessive worry, and ruminations.    [name] participated in the creation of the treatment plan)    [name], [name]

## 2023-07-30 ENCOUNTER — Other Ambulatory Visit: Payer: Self-pay | Admitting: Adult Health

## 2023-07-30 DIAGNOSIS — F3281 Premenstrual dysphoric disorder: Secondary | ICD-10-CM

## 2023-07-30 DIAGNOSIS — F411 Generalized anxiety disorder: Secondary | ICD-10-CM

## 2023-08-04 DIAGNOSIS — D2271 Melanocytic nevi of right lower limb, including hip: Secondary | ICD-10-CM | POA: Diagnosis not present

## 2023-08-04 DIAGNOSIS — D2262 Melanocytic nevi of left upper limb, including shoulder: Secondary | ICD-10-CM | POA: Diagnosis not present

## 2023-08-04 DIAGNOSIS — D2261 Melanocytic nevi of right upper limb, including shoulder: Secondary | ICD-10-CM | POA: Diagnosis not present

## 2023-08-04 DIAGNOSIS — D2272 Melanocytic nevi of left lower limb, including hip: Secondary | ICD-10-CM | POA: Diagnosis not present

## 2023-08-06 ENCOUNTER — Ambulatory Visit: Payer: BC Managed Care – PPO | Admitting: Psychology

## 2023-08-06 DIAGNOSIS — F3281 Premenstrual dysphoric disorder: Secondary | ICD-10-CM

## 2023-08-06 DIAGNOSIS — F411 Generalized anxiety disorder: Secondary | ICD-10-CM | POA: Diagnosis not present

## 2023-08-06 NOTE — Progress Notes (Signed)
 [facility] Counselor/Therapist Progress Note  Patient ID: Angela Harrell, MRN: [id number]   Date: 08/06/23  Time Spent: 10:04 am - 11:02 am : 58 Minutes  Treatment Type: Individual Therapy.  Reported Symptoms: depression and anxiety.   Mental Status Exam: Appearance:  Casual     Behavior: Appropriate  Motor: Normal  Speech/Language:  Clear and Coherent  Affect: Appropriate  Mood: normal  Thought process: normal  Thought content:   WNL  Sensory/Perceptual disturbances:   WNL  Orientation: oriented to person, place, time/date, and situation  Attention: Good  Concentration: Good  Memory: WNL  Fund of knowledge:  Good  Insight:   Good  Judgment:  Good  Impulse Control: Good   Risk Assessment: Danger to Self:  No Self-injurious Behavior: No Danger to Others: No Duty to Warn:no Physical Aggression / Violence:No  Access to Firearms a concern: No  Gang Involvement:No   Subjective:   [name] participated from home, via video, is aware of the limitations of tele-sessions, and consented to treatment. Therapist participated from home office. She noted the events of the past two weeks. 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 We worked on exploring this during the session. She noted often feeling overwhelmed by various tasks and noted difficulty staying organized. She noted this being distressing. 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 We discussed ways to manage the distress and Tonna noted a need to take a deep breath and taking a break from the task. She noted feeling better after recovering from her cold but noted her son, Cheryle getting sick immediately after. She noted losing 10# due to her illness. She noted working on more healthfully since her appetite has returned. She noted her mother's continued refusal to accept help for caring for Dave's father via the  use of a home nurse and noted her father's continued cognitive decline. She noted a recent visit her children had with their grandparents and noted that she has not seen them since December. She noted her life being better when I don't interact with him. She noted her attempts to be mindful of the positives, in the past, with her father. She noted various events in her childhood, often being questioned and critiqued, and receiving negative feedback about things that should be good things including electing to take a spanish class. She noted being lumped in with her brothers who had significant behavioral and mental health concerns. She noted the effect of this experience on her including I don't take criticism well. We worked on processing this during the session. She noted her goal to get more exercise and walk consistently. Therapist praised [name] for her vulnerability and disclosure during the session. Therapist encouraged [name] to set reasonable and achievable goals for exercise, address barriers, and prepare ahead of time. Therapist validated and normalized [name]'s feelings and experience and provided supportive therapy.   Interventions: BA and CBT.   Diagnosis:  Generalized anxiety disorder  PMDD (premenstrual dysphoric disorder)  Psychiatric Treatment: Yes , [name]. See chart for meds.    Treatment Plan:  Client Abilities/Strengths Samone is self-aware and motivated for change.   Support System: Family and friends.   Client Treatment Preferences Outpatient Therapy.   Client Statement of Needs [name] would like to manage day-to-day stressors, process past events, process her father's cognitive decline and behavior in public,  being less avoidant, manage family stressors,  improve motivation and organization, spend more time outside, and manage symptoms.   Treatment Level Weekly  Symptoms  Depression: Loss of interest, fluctuating sleep,  lethargy, feeling bad about self, and difficulty concentrating.    (Status: maintained) Anxiety: Anxious, trouble relaxing, and irritability.    (Status: maintained)  Goals:   [name] experiences symptoms of anxiety and depression   Target Date: 09/18/23 Frequency: Weekly  Progress: 10 Modality: individual    Therapist will provide referrals for additional resources as appropriate.  Therapist will provide psycho-education regarding [name]'s diagnosis and corresponding treatment approaches and interventions. Licensed Clinical Social Worker, [place], [name] will support the patient's ability to achieve the goals identified. will employ CBT, BA, Problem-solving, Solution Focused, Mindfulness,  coping skills, & other evidenced-based practices will be used to promote progress towards healthy functioning to help manage decrease symptoms associated with her diagnosis.   Reduce overall level, frequency, and intensity of the feelings of depression, anxiety and panic evidenced by decreased overall symptoms from 6 to 7 days/week to 0 to 1 days/week per client report for at least 3 consecutive months. Verbally express understanding of the relationship between feelings of depression, anxiety and their impact on thinking patterns and behaviors. Verbalize an understanding of the role that distorted thinking plays in creating fears, excessive worry, and ruminations.    [name] participated in the creation of the treatment plan)    [name], [name]

## 2023-08-14 ENCOUNTER — Encounter: Payer: Self-pay | Admitting: Adult Health

## 2023-08-14 ENCOUNTER — Ambulatory Visit: Payer: BC Managed Care – PPO | Admitting: Adult Health

## 2023-08-14 DIAGNOSIS — F411 Generalized anxiety disorder: Secondary | ICD-10-CM | POA: Diagnosis not present

## 2023-08-14 DIAGNOSIS — F3281 Premenstrual dysphoric disorder: Secondary | ICD-10-CM | POA: Diagnosis not present

## 2023-08-14 MED ORDER — SERTRALINE HCL 25 MG PO TABS
25.0000 mg | ORAL_TABLET | Freq: Every day | ORAL | 1 refills | Status: DC
Start: 2023-08-14 — End: 2024-01-09

## 2023-08-14 NOTE — Progress Notes (Signed)
Angela Harrell [id number] October 25, 1975 48 y.o.  Virtual Visit via Telephone Note  I connected with pt on 08/14/23 at  9:00 AM EST by telephone and verified that I am speaking with the correct person using two identifiers.   I discussed the limitations, risks, security and privacy concerns of performing an evaluation and management service by telephone and the availability of in person appointments. I also discussed with the patient that there may be a patient responsible charge related to this service. The patient expressed understanding and agreed to proceed.   I discussed the assessment and treatment plan with the patient. The patient was provided an opportunity to ask questions and all were answered. The patient agreed with the plan and demonstrated an understanding of the instructions.   The patient was advised to call back or seek an in-person evaluation if the symptoms worsen or if the condition fails to improve as anticipated.  I provided 20 minutes of non-face-to-face time during this encounter.  The patient was located at home.  The provider was located at [facility].   Angela Gibbs, NP   Subjective:   Patient ID:  Angela Harrell is a 48 y.o. (DOB 07/12/1975) female.  Chief Complaint: No chief complaint on file.   HPI Angela Harrell presents for follow-up of PMDD and GAD.  Describes mood today as "ok". Pleasant. Reports decreased tearfulness. Mood symptoms - denies   depression. Reports some anxiety and irritability - more situational. Denies worry, rumination, and over thinking. Mood has improved. Stating "I feel like I'm doing better". Feels like the Zoloft at 25mg  daily is helpful. Taking current medications as prescribed. Seeing therapist - Lennice Sites. Energy levels improved. Active, does not have a regular exercise routine with current physical limitations. Enjoys some usual interests and activities. Married 23 years - 3 children. Parents local - grandmother.  Spending time with family. Appetite adequate. Weight loss with recent illness.    Sleeps well most nights. Averages 6 hours Focus and concentration stable. Completing tasks. Managing aspects of household. Works part-time - Financial trader. Denies SI or HI.  Denies AH or VH. Denies self harm. Denies substance use.  History of seasonal depression.  Previous medication trials: Celexa 10mg  daily   Review of Systems:  Review of Systems  Musculoskeletal:  Negative for gait problem.  Neurological:  Negative for tremors.  Psychiatric/Behavioral:         Please refer to HPI    Medications: I have reviewed the patient's current medications.  Current Outpatient Medications  Medication Sig Dispense Refill   albuterol (PROVENTIL HFA) 108 (90 Base) MCG/ACT inhaler Inhale 2 puffs into the lungs every 6 (six) hours as needed for wheezing or shortness of breath. 1 each 0   cetirizine (ZYRTEC) 10 MG tablet Take 10 mg by mouth daily.     Cholecalciferol (VITAMIN D3 PO) Take 2,000 mg by mouth daily.      doxycycline (VIBRA-TABS) 100 MG tablet Take 1 tablet (100 mg total) by mouth 2 (two) times daily. 20 tablet 0   HYDROcodone bit-homatropine (HYCODAN) 5-1.5 MG/5ML syrup Take 5 mLs by mouth every 4 (four) hours as needed. 240 mL 0   MAGNESIUM PO Take by mouth.     Omega-3 Fatty Acids (FISH OIL) 1000 MG CAPS Take by mouth.     Probiotic Product (PROBIOTIC PO) Take 1 tablet by mouth every other day.     PSYLLIUM HUSK PO Take 2 capsules by mouth daily.     rosuvastatin (CRESTOR) 10  MG tablet Take 1 tablet (10 mg total) by mouth daily. Please call [phone] to schedule a follow up appointment. 90 tablet 3   sertraline (ZOLOFT) 50 MG tablet Take 0.5 tablets (25 mg total) by mouth daily. Taking 25 mg 30 tablet 0   SPIRULINA PO Take 1 tablet by mouth daily. For food allergy prevention     Triamcinolone Acetonide (TRIAMCINOLONE 0.1 % CREAM : EUCERIN) CREA Apply 1 application topically 2 (two) times daily  as needed for rash or itching. 500 each 1   Turmeric Curcumin 500 MG CAPS Take by mouth.     Current Facility-Administered Medications  Medication Dose Route Frequency Provider Last Rate Last Admin   0.9 %  sodium chloride infusion  500 mL Intravenous Continuous Cirigliano, Vito V, DO        Medication Side Effects: None  Allergies:  Allergies  Allergen Reactions   Beef-Derived Drug Products Diarrhea    Nausea and vomiting   Prochlorperazine Edisylate Shortness Of Breath    Compazine Tongue swelling   Prochlorperazine Edisylate Swelling    Swelling of throat   Tree Extract Anaphylaxis    Throat itchy, roof of mouth gets raw   Banana Itching and Nausea And Vomiting    Throat gets itchy   Beef (Bovine) Protein     Diarrhea and stomach cramps    Oxycodone-Acetaminophen     Vomiting   Permethrin Rash    Rash and fever   Statins Itching   Morphine And Codeine Itching   Other     Tree Nuts (can have peanuts and cashews) Pyrethrine (ingredient in lice products)- fever and rash   Oxycodone-Acetaminophen Nausea And Vomiting   Latex Hives and Rash    Rash    Past Medical History:  Diagnosis Date   ALLERGIC RHINITIS 05/31/2008   Anemia    ANXIETY 05/31/2008   ASTHMA 05/31/2008   Asthma    CHOLELITHIASIS 05/31/2008   High blood pressure    readings with stones   History of kidney stones    History of right breast biopsy 2017   HYPERLIPIDEMIA 05/31/2008   Kidney stone    NEPHROLITHIASIS, HX OF 05/31/2008    Family History  Problem Relation Age of Onset   Breast cancer Mother 45   Arthritis Mother    Hyperlipidemia Father        triglycerides   Mental illness Brother        bipolar   Asthma Brother    Cancer Maternal Uncle        mult myeloma   Vascular Disease Maternal Uncle    Heart disease Paternal Grandfather    Allergic Disorder Daughter    Colon cancer Neg Hx    Colon polyps Neg Hx    Esophageal cancer Neg Hx    Stomach cancer Neg Hx    Rectal cancer  Neg Hx     Social History   Socioeconomic History   Marital status: Married    Spouse name: Not on file   Number of children: 3   Years of education: BS   Highest education level: Bachelor's degree (e.g., BA, AB, BS)  Occupational History   Occupation: Metallurgist   Occupation: Substitute  Tobacco Use   Smoking status: Never   Smokeless tobacco: Never  Substance and Sexual Activity   Alcohol use: No   Drug use: No   Sexual activity: Yes    Birth control/protection: Surgical  Other Topics Concern   Not on file  Social  History Narrative   Lives with husband & 3 kids, work at home, PT outside of home. Often volunteers.   Social Drivers of Corporate investment banker Strain: Low Risk  (07/18/2023)   Overall Financial Resource Strain (CARDIA)    Difficulty of Paying Living Expenses: Not very hard  Food Insecurity: No Food Insecurity (07/18/2023)   Hunger Vital Sign    Worried About Running Out of Food in the Last Year: Never true    Ran Out of Food in the Last Year: Never true  Transportation Needs: No Transportation Needs (07/18/2023)   PRAPARE - Administrator, Civil Service (Medical): No    Lack of Transportation (Non-Medical): No  Physical Activity: Insufficiently Active (07/18/2023)   Exercise Vital Sign    Days of Exercise per Week: 3 days    Minutes of Exercise per Session: 30 min  Stress: No Stress Concern Present (07/18/2023)   [employer] of Occupational Health - Occupational Stress Questionnaire    Feeling of Stress : Only a little  Social Connections: Socially Integrated (07/18/2023)   Social Connection and Isolation Panel [NHANES]    Frequency of Communication with Friends and Family: More than three times a week    Frequency of Social Gatherings with Friends and Family: Once a week    Attends Religious Services: More than 4 times per year    Active Member of [employer] or Organizations: Yes    Attends Engineer, structural: More than 4 times per  year    Marital Status: Married  Catering manager Violence: Not on file    Past Medical History, Surgical history, Social history, and Family history were reviewed and updated as appropriate.   Please see review of systems for further details on the patient's review from today.   Objective:   Physical Exam:  There were no vitals taken for this visit.  Physical Exam Constitutional:      General: She is not in acute distress. Musculoskeletal:        General: No deformity.  Neurological:     Mental Status: She is alert and oriented to person, place, and time.     Coordination: Coordination normal.  Psychiatric:        Attention and Perception: Attention and perception normal. She does not perceive auditory or visual hallucinations.        Mood and Affect: Affect is not labile, blunt, angry or inappropriate.        Speech: Speech normal.        Behavior: Behavior normal.        Thought Content: Thought content normal. Thought content is not paranoid or delusional. Thought content does not include homicidal or suicidal ideation. Thought content does not include homicidal or suicidal plan.        Cognition and Memory: Cognition and memory normal.        Judgment: Judgment normal.     Comments: Insight intact     Lab Review:     Component Value Date/Time   NA 135 10/07/2022 0932   K 4.1 10/07/2022 0932   CL 102 10/07/2022 0932   CO2 25 10/07/2022 0932   GLUCOSE 115 (H) 10/07/2022 0932   BUN 13 10/07/2022 0932   CREATININE 0.81 10/07/2022 0932   CALCIUM 9.9 10/07/2022 0932   PROT 7.6 10/07/2022 0932   ALBUMIN 4.6 10/07/2022 0932   AST 32 10/07/2022 0932   ALT 32 10/07/2022 0932   ALKPHOS 69 10/07/2022 0932  BILITOT 0.5 10/07/2022 0932   GFRNONAA 53 (L) 03/30/2017 0550   GFRAA >60 03/30/2017 0550       Component Value Date/Time   WBC 8.2 03/18/2018 1036   RBC 4.61 03/18/2018 1036   HGB 13.3 03/18/2018 1036   HCT 39.7 03/18/2018 1036   PLT 302.0 03/18/2018 1036    MCV 86.2 03/18/2018 1036   MCH 28.4 03/30/2017 0550   MCHC 33.5 03/18/2018 1036   RDW 14.0 03/18/2018 1036   LYMPHSABS 2.9 11/09/2014 0930   MONOABS 0.5 11/09/2014 0930   EOSABS 0.4 11/09/2014 0930   BASOSABS 0.0 11/09/2014 0930    No results found for: "POCLITH", "LITHIUM"   No results found for: "PHENYTOIN", "PHENOBARB", "VALPROATE", "CBMZ"   .res Assessment: Plan:    Plan:  PDMP reviewed  Continue Zoloft 25mg  daily   Considering ADD testing.  RTC 6 months  20 minutes spent dedicated to the care of this patient on the date of this encounter to include pre-visit review of records, ordering of medication, post visit documentation, and face-to-face time with the patient discussing GAD and PMDD. Discussed continuing current medication regimen.  Patient advised to contact office with any questions, adverse effects, or acute worsening in signs and symptoms.  There are no diagnoses linked to this encounter.  Please see After Visit Summary for patient specific instructions.  Future Appointments  Date Time Provider Department Center  08/14/2023  9:00 AM Annison Birchard, Thereasa Solo, NP [facility] None  08/20/2023 10:00 AM [name], [name] [facility] None  09/03/2023 10:00 AM [name], [name] [facility] None  09/17/2023 10:00 AM [name], [name] [facility] None  10/01/2023 10:00 AM [name], [name] [facility] None  10/29/2023 10:00 AM [name], [name] [facility] None  11/12/2023 10:00 AM [name], [name] [facility] None  11/26/2023 10:00 AM [name], [name] [facility] None  12/10/2023 10:00 AM [name], [name] [facility] None  12/24/2023 10:00 AM [name], [name] [facility] None  01/07/2024 10:00 AM [name], [name] [facility] None  01/21/2024 10:00 AM [name], [name] [facility] None  02/04/2024 10:00 AM [name], [name] [facility] None  02/18/2024 10:00 AM [name], [name] [facility] None  03/03/2024 10:00 AM [name], [name] [facility] None   03/17/2024 10:00 AM [name], [name] [facility] None  03/31/2024 10:00 AM [name], [name] [facility] None  04/14/2024 10:00 AM [name], [name] [facility] None  04/28/2024 10:00 AM [name], [name] [facility] None  05/12/2024 10:00 AM [name], [name] [facility] None  05/26/2024 10:00 AM [name], [name] [facility] None  06/09/2024 10:00 AM [name], [name] [facility] None  06/23/2024 10:00 AM [name], [name] [facility] None    No orders of the defined types were placed in this encounter.     -------------------------------

## 2023-08-20 ENCOUNTER — Ambulatory Visit: Payer: BC Managed Care – PPO | Admitting: Psychology

## 2023-08-20 DIAGNOSIS — F411 Generalized anxiety disorder: Secondary | ICD-10-CM

## 2023-08-20 DIAGNOSIS — F3281 Premenstrual dysphoric disorder: Secondary | ICD-10-CM

## 2023-08-20 NOTE — Progress Notes (Signed)
Behavioral Health Counselor/Therapist Progress Note  Patient ID: Angela Harrell, MRN: [id number]   Date: 08/20/23  Time Spent: 10:03 am - 11:02 am :59 Minutes  Treatment Type: Individual Therapy.  Reported Symptoms: depression and anxiety.   Mental Status Exam: Appearance:  Casual     Behavior: Appropriate  Motor: Normal  Speech/Language:  Clear and Coherent  Affect: Appropriate  Mood: normal  Thought process: normal  Thought content:   WNL  Sensory/Perceptual disturbances:   WNL  Orientation: oriented to person, place, time/date, and situation  Attention: Good  Concentration: Good  Memory: WNL  Fund of knowledge:  Good  Insight:   Good  Judgment:  Good  Impulse Control: Good   Risk Assessment: Danger to Self:  No Self-injurious Behavior: No Danger to Others: No Duty to Warn:no Physical Aggression / Violence:No  Access to Firearms a concern: No  Gang Involvement:No   Subjective:   Angela Harrell participated from home, via video, is aware of the limitations of tele-sessions, and consented to treatment. Therapist participated from home office. She noted the events of the past two weeks. She noted her recent efforts to identify chores and tasks, in the home, and to work on identifying that each family member can take over and address consistently. She noted some familial stress in relation to her children and the possibility working at the same establishment. She noted a recent injury, injuring her rail bone, and having to manage this pain ad the effect of this on her day-to-day functioning and mobility. She noted her intent to take on additional tasks and roles within volunteering but noted frustration regarding to "have to explain myself". She noted "Trying to make good decisions and immediately getting flack for it". She added "The people who are supposed to care the most for you often give you the most flack". She noted her family history of often being blamed by her  siblings for their own decision-making. She noted her reaction to criticism, as an adult, and the reminders of her past experience. She displayed skill in challenging this with data. Therapist praised Angela Harrell for this and introduced "Wise Mind" during the session and provided handouts and audio-clips (relaxation), for reference and review. Therapist provided psycho-education regarding the topic and we will review things, going forward. Angela Harrell was engaged and motivated during the session and expressed commitment towards goals. Therapist validated Angela Harrell's feelings and experience.   Interventions: Interpersonal and CBT.   Diagnosis:  Generalized anxiety disorder  PMDD (premenstrual dysphoric disorder)  Psychiatric Treatment: Yes , [name] - [facility]. See chart for meds.    Treatment Plan:  Client Abilities/Strengths Angela Harrell is self-aware and motivated for change.   Support System: Family and friends.   Client Treatment Preferences Outpatient Therapy.   Client Statement of Needs Angela Harrell would like to manage day-to-day stressors, process past events, process her father's cognitive decline and behavior in public,  being less avoidant, manage family stressors, improve motivation and organization, spend more time outside, and manage symptoms.   Treatment Level Weekly  Symptoms  Depression: Loss of interest, fluctuating sleep, lethargy, feeling bad about self, and difficulty concentrating.    (Status: maintained) Anxiety: Anxious, trouble relaxing, and irritability.    (Status: maintained)  Goals:   [name] experiences symptoms of anxiety and depression   Target Date: 09/18/23 Frequency: Weekly  Progress: 10 Modality: individual    Therapist will provide referrals for additional resources as appropriate.  Therapist will provide psycho-education regarding Angela Harrell's diagnosis and corresponding treatment approaches  and interventions. Licensed Clinical Social Worker, [place], Angela Harrell will support the patient's ability to achieve the goals identified. will employ CBT, BA, Problem-solving, Solution Focused, Mindfulness,  coping skills, & other evidenced-based practices will be used to promote progress towards healthy functioning to help manage decrease symptoms associated with her diagnosis.   Reduce overall level, frequency, and intensity of the feelings of depression, anxiety and panic evidenced by decreased overall symptoms from 6 to 7 days/week to 0 to 1 days/week per client report for at least 3 consecutive months. Verbally express understanding of the relationship between feelings of depression, anxiety and their impact on thinking patterns and behaviors. Verbalize an understanding of the role that distorted thinking plays in creating fears, excessive worry, and ruminations.    Angela Harrell participated in the creation of the treatment plan)    Angela Harrell, Angela Harrell

## 2023-09-03 ENCOUNTER — Ambulatory Visit: Payer: BC Managed Care – PPO | Admitting: Psychology

## 2023-09-03 DIAGNOSIS — F411 Generalized anxiety disorder: Secondary | ICD-10-CM | POA: Diagnosis not present

## 2023-09-03 DIAGNOSIS — F3281 Premenstrual dysphoric disorder: Secondary | ICD-10-CM

## 2023-09-03 NOTE — Progress Notes (Signed)
 [facility] Counselor/Therapist Progress Note  Patient ID: Angela Harrell, MRN: [id number]   Date: 09/03/23  Time Spent: 10:03 am - 11: 02 am : 59 Minutes  Treatment Type: Individual Therapy.  Reported Symptoms: depression and anxiety.   Mental Status Exam: Appearance:  Casual     Behavior: Appropriate  Motor: Normal  Speech/Language:  Clear and Coherent  Affect: Appropriate  Mood: normal  Thought process: normal  Thought content:   WNL  Sensory/Perceptual disturbances:   WNL  Orientation: oriented to person, place, time/date, and situation  Attention: Good  Concentration: Good  Memory: WNL  Fund of knowledge:  Good  Insight:   Good  Judgment:  Good  Impulse Control: Good   Risk Assessment: Danger to Self:  No Self-injurious Behavior: No Danger to Others: No Duty to Warn:no Physical Aggression / Violence:No  Access to Firearms a concern: No  Gang Involvement:No   Subjective:   Angela Harrell participated from home, via video, is aware of the limitations of tele-sessions, and consented to treatment. Therapist participated from home office. She noted the events of the past two weeks. We reflected on the previous session and her insight about how her childhood can affect her current dynamics. She noted difficulty setting boundaries with someone at her volunteer opportunity. She noted this individual has a history of "being an a-hole" and noted having negative interactions. She noted this person being unpredictable and often becoming antagonistic. We worked on identifying possible boundaries, going forward. She noted often wanting to be "in the background". She noted this approach in childhood and noted that "if you're in the background, then you aren't worried about being screamed at". She noted "it got to a  point that it was easier to get unnoticed". She noted in teen years often being antagonized by girls as well, often called a "slut". She noted being often being  "catcalled" despite a lack of interest, being married, and not initiating this interaction. She noted how this has affected her in adulthood, as well. She noted "feeling like two people, sometimes". She noted being good at defending others verses defending self.  She noted tax related stressors, in regards to filing, and having to manage this. She noted not being accepted by many of her in-laws.  She noted interest in increasing her exercise. She noted her continued stressors with her father, who has been declining cognitively, and noted her sense of responsibility towards her father and wondered where the limits are. We will work on processing this going forward. Therapist validated Angela Harrell's feelings and experience and provided supportive therapy. A follow-up was scheduled for continued treatment.    Interventions: Interpersonal and CBT.   Diagnosis:  Generalized anxiety disorder  PMDD (premenstrual dysphoric disorder)  Psychiatric Treatment: Yes , Angela Harrell - [facility]. See chart for meds.    Treatment Plan:  Client Abilities/Strengths Angela Harrell is self-aware and motivated for change.   Support System: Family and friends.   Client Treatment Preferences Outpatient Therapy.   Client Statement of Needs Angela Harrell would like to manage day-to-day stressors, process past events, process her father's cognitive decline and behavior in public,  being less avoidant, manage family stressors, improve motivation and organization, spend more time outside, and manage symptoms.   Treatment Level Weekly  Symptoms  Depression: Loss of interest, fluctuating sleep, lethargy, feeling bad about self, and difficulty concentrating.    (Status: maintained) Anxiety: Anxious, trouble relaxing, and irritability.    (Status: maintained)  Goals:   Angela Harrell experiences symptoms  of anxiety and depression   Target Date: 09/18/23 Frequency: Weekly  Progress: 10 Modality: individual    Therapist will  provide referrals for additional resources as appropriate.  Therapist will provide psycho-education regarding Angela Harrell's diagnosis and corresponding treatment approaches and interventions. Licensed Clinical Social Worker, [place], [name] will support the patient's ability to achieve the goals identified. will employ CBT, BA, Problem-solving, Solution Focused, Mindfulness,  coping skills, & other evidenced-based practices will be used to promote progress towards healthy functioning to help manage decrease symptoms associated with her diagnosis.   Reduce overall level, frequency, and intensity of the feelings of depression, anxiety and panic evidenced by decreased overall symptoms from 6 to 7 days/week to 0 to 1 days/week per client report for at least 3 consecutive months. Verbally express understanding of the relationship between feelings of depression, anxiety and their impact on thinking patterns and behaviors. Verbalize an understanding of the role that distorted thinking plays in creating fears, excessive worry, and ruminations.    [name] participated in the creation of the treatment plan)    [name], [name]

## 2023-09-17 ENCOUNTER — Ambulatory Visit: Payer: BC Managed Care – PPO | Admitting: Psychology

## 2023-09-17 DIAGNOSIS — F411 Generalized anxiety disorder: Secondary | ICD-10-CM

## 2023-09-17 DIAGNOSIS — F3281 Premenstrual dysphoric disorder: Secondary | ICD-10-CM

## 2023-09-17 NOTE — Progress Notes (Signed)
 [facility] Counselor/Therapist Progress Note  Patient ID: Angela Harrell, MRN: [id number]   Date: 09/17/23  Time Spent: 10:05 am - 10:59 am : 54 Minutes  Treatment Type: Individual Therapy.  Reported Symptoms: depression and anxiety.   Mental Status Exam: Appearance:  Casual     Behavior: Appropriate  Motor: Normal  Speech/Language:  Clear and Coherent  Affect: Appropriate  Mood: dysthymic  Thought process: normal  Thought content:   WNL  Sensory/Perceptual disturbances:   WNL  Orientation: oriented to person, place, time/date, and situation  Attention: Good  Concentration: Good  Memory: WNL  Fund of knowledge:  Good  Insight:   Good  Judgment:  Good  Impulse Control: Good   Risk Assessment: Danger to Self:  No Self-injurious Behavior: No Danger to Others: No Duty to Warn:no Physical Aggression / Violence:No  Access to Firearms a concern: No  Gang Involvement:No   Subjective:   Angela Harrell participated from home, via video, is aware of the limitations of tele-sessions, and consented to treatment. We switched to audio durling the later part of the session due to technical difficulties. >50% of the session was conducted via video. Therapist participated from home office. She noted the events of the past two weeks. 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 She noted a friend who is terminal and having a couple of weeks to live. We worked on exploring this during the session. She noted her attempts to provide support and communicate with her friend on a regular basis. We worked on exploring this and processing her feelings. She noted this being a reminder of the loss of her father in-law and noted that his decline occurred quickly despite initial predictions. She noted her attempts to use the Wise-mind activity between sessions and noted that this had mixed results. We worked on processing this and  therapy encouraged to continue employing the portion of the tool that works. She noted feeling overwhelmed by household chores. We worked on processing this during the session.  She noted her effort to "Schedule things to look forward do". Therapist praised Angela Harrell for this during the session and encouraged continued effort in this area. She noted her frustration regarding the current political standing and general atmosphere. We worked on processing this and Angela Harrell noted her effort to modulate her consumption of the news. Therapist praised Angela Harrell for mindfulness regarding mood, investing in self, and setting boundaries for self. Therapist validated Angela Harrell's feelings and experience and provided supportive therapy.   Interventions: Interpersonal and CBT.   Diagnosis:  Generalized anxiety disorder  PMDD (premenstrual dysphoric disorder)  Psychiatric Treatment: Yes , [name] - [facility]. See chart for meds.    Treatment Plan:  Client Abilities/Strengths Angela Harrell is self-aware and motivated for change.   Support System: Family and friends.   Client Treatment Preferences Outpatient Therapy.   Client Statement of Needs Angela Harrell would like to manage day-to-day stressors, process past events, process her father's cognitive decline and behavior in public,  being less avoidant, manage family stressors, improve motivation and organization, spend more time outside, and manage symptoms.   Treatment Level Weekly  Symptoms  Depression: Loss of interest, fluctuating sleep, lethargy, feeling bad about self, and difficulty concentrating.    (Status: maintained) Anxiety: Anxious, trouble relaxing, and irritability.    (Status: maintained)  Goals:   Angela Harrell experiences symptoms of anxiety and depression   Target Date: 09/18/23 Frequency: Weekly  Progress: 10 Modality: individual  Therapist will provide referrals for additional resources as appropriate.  Therapist will provide  psycho-education regarding Angela Harrell's diagnosis and corresponding treatment approaches and interventions. Licensed Clinical Social Worker, [place], [name] will support the patient's ability to achieve the goals identified. will employ CBT, BA, Problem-solving, Solution Focused, Mindfulness,  coping skills, & other evidenced-based practices will be used to promote progress towards healthy functioning to help manage decrease symptoms associated with her diagnosis.   Reduce overall level, frequency, and intensity of the feelings of depression, anxiety and panic evidenced by decreased overall symptoms from 6 to 7 days/week to 0 to 1 days/week per client report for at least 3 consecutive months. Verbally express understanding of the relationship between feelings of depression, anxiety and their impact on thinking patterns and behaviors. Verbalize an understanding of the role that distorted thinking plays in creating fears, excessive worry, and ruminations.    Angela Harrell participated in the creation of the treatment plan)    [name], [name]

## 2023-10-01 ENCOUNTER — Ambulatory Visit: Payer: BC Managed Care – PPO | Admitting: Psychology

## 2023-10-01 DIAGNOSIS — F411 Generalized anxiety disorder: Secondary | ICD-10-CM | POA: Diagnosis not present

## 2023-10-01 DIAGNOSIS — F3281 Premenstrual dysphoric disorder: Secondary | ICD-10-CM
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
## 2023-10-04 ENCOUNTER — Other Ambulatory Visit: Payer: Self-pay | Admitting: Adult Health

## 2023-10-04 DIAGNOSIS — F3281 Premenstrual dysphoric disorder: Secondary | ICD-10-CM

## 2023-10-04 DIAGNOSIS — F411 Generalized anxiety disorder: Secondary | ICD-10-CM

## 2023-10-15 ENCOUNTER — Ambulatory Visit: Payer: BC Managed Care – PPO | Admitting: Psychology

## 2023-10-25 ENCOUNTER — Other Ambulatory Visit: Payer: Self-pay | Admitting: Family Medicine

## 2023-10-25 DIAGNOSIS — E782 Mixed hyperlipidemia: Secondary | ICD-10-CM

## 2023-10-29 ENCOUNTER — Ambulatory Visit: Payer: BC Managed Care – PPO | Admitting: Psychology

## 2023-10-29 DIAGNOSIS — F3281 Premenstrual dysphoric disorder: Secondary | ICD-10-CM | POA: Diagnosis not present

## 2023-10-29 DIAGNOSIS — F411 Generalized anxiety disorder: Secondary | ICD-10-CM
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
## 2023-11-12 ENCOUNTER — Ambulatory Visit: Payer: BC Managed Care – PPO | Admitting: Psychology

## 2023-11-12 ENCOUNTER — Encounter: Payer: Self-pay | Admitting: Psychology

## 2023-11-12 DIAGNOSIS — F3281 Premenstrual dysphoric disorder: Secondary | ICD-10-CM | POA: Diagnosis not present

## 2023-11-12 DIAGNOSIS — F411 Generalized anxiety disorder: Secondary | ICD-10-CM

## 2023-11-12 NOTE — Progress Notes (Signed)
 [facility] Counselor/Therapist Progress Note  Patient ID: Angela Harrell, MRN: [id number]   Date: 11/12/23  Time Spent: 10:06  am - 10:59 am : 53 Minutes  Treatment Type: Individual Therapy.  Reported Symptoms: depression and anxiety.   Mental Status Exam: Appearance:  Casual     Behavior: Appropriate  Motor: Normal  Speech/Language:  Clear and Coherent and Normal Rate  Affect: Congruent  Mood: normal  Thought process: normal  Thought content:   WNL  Sensory/Perceptual disturbances:   WNL  Orientation: oriented to person, place, time/date, and situation  Attention: Good  Concentration: Good  Memory: WNL  Fund of knowledge:  Good  Insight:   Good  Judgment:  Good  Impulse Control: Good   Risk Assessment: Danger to Self:  No Self-injurious Behavior: No Danger to Others: No Duty to Warn:no Physical Aggression / Violence:No  Access to Firearms a concern: No  Gang Involvement:No   Subjective:   Angela Harrell participated from home, via video and consented to treatment. Therapist participated from home office. I discussed the limitations of evaluation and management by telemedicine and the availability of in person appointments. The patient expressed understanding and agreed to proceed. [name] reviewed the events of the past week.   We reviewed numerous treatment approaches including CBT, BA, Problem Solving, and Solution focused therapy. Psych-education regarding the Meoshia's diagnosis of Generalized anxiety disorder  PMDD (premenstrual dysphoric disorder) was provided during the session. We discussed Angela Harrell goals treatment goals which include engage in activities that she previously avoided due to anxiety, building and maintaining a daily routine, managing distress, managing overall symptoms, processing past events, building a larger support system, maintain sessions with psychiatric provider, being more assertive, verbalizing needs, managing guilt of not  spending time with father (dementia), and setting boundaries with others. Angela Harrell provided verbal approval of the treatment plan.   Interventions: Psycho-education & Goal Setting.   Diagnosis:  Generalized anxiety disorder  PMDD (premenstrual dysphoric disorder)  Psychiatric Treatment: Yes , [name], NP.    Treatment Plan:  Client Abilities/Strengths Brielle is intelligent, self-aware, and motivated for change.   Support System: Family and friends.   Client Treatment Preferences Outpatient therapy and continued psychiatric treatment.   Client Statement of Needs Angela Harrell would like to engage in activities that she previously avoided due to anxiety, building and maintaining a daily routine, managing distress, managing overall symptoms, processing past events, building a larger support system, maintain sessions with psychiatric provider, being more assertive, verbalizing needs, and setting boundaries with others, managing social pressure (owing explanations or answering requests immediately).    Treatment Level Weekly  Symptoms  Depression: feeling down, poor sleep, lethargy, fluctuating appetite, feeling bad about self.   (Status: maintained) Anxiety:  Feeling anxious, difficulty managing worry, worrying about different things, trouble relaxing, irritability, and feeling afraid something awful might happen.   (Status: maintained)  Goals:   Rikayla experiences symptoms of depression and anxiety.   Treatment plan signed and available on s-drive:   No, pending signature via MyChart.  Angela Harrell was sent the treatment plan signature form on 11/12/23.   Target Date: 11/11/24 Frequency: Weekly  Progress: 0 Modality: individual    Therapist will provide referrals for additional resources as appropriate.  Therapist will provide psycho-education regarding Angela Harrell's diagnosis and corresponding treatment approaches and interventions. [name], [name] will support the  patient's ability to achieve the goals identified. will employ CBT, BA, Problem-solving, Solution Focused, Mindfulness,  coping skills, & other evidenced-based practices will  be used to promote progress towards healthy functioning to help manage decrease symptoms associated with her diagnosis.   Reduce overall level, frequency, and intensity of the feelings of depression, anxiety.  evidenced by decreased overall symptoms from 6 to 7 days/week to 0 to 1 days/week per client report for at least 3 consecutive months. Verbally express understanding of the relationship between feelings of depression, anxiety and their impact on thinking patterns and behaviors. Verbalize an understanding of the role that distorted thinking plays in creating fears, excessive worry, and ruminations.    Angela Harrell participated in the creation of the treatment plan)   [name], [name]

## 2023-11-26 ENCOUNTER — Ambulatory Visit: Payer: BC Managed Care – PPO | Admitting: Psychology

## 2023-11-26 ENCOUNTER — Other Ambulatory Visit: Payer: Self-pay | Admitting: Family Medicine

## 2023-11-26 DIAGNOSIS — E782 Mixed hyperlipidemia: Secondary | ICD-10-CM

## 2023-11-26 DIAGNOSIS — F3281 Premenstrual dysphoric disorder: Secondary | ICD-10-CM | POA: Diagnosis not present

## 2023-11-26 DIAGNOSIS — F411 Generalized anxiety disorder: Secondary | ICD-10-CM | POA: Diagnosis not present

## 2023-11-26 NOTE — Progress Notes (Signed)
 [facility] Counselor/Therapist Progress Note  Patient ID: Angela Harrell, MRN: [id number]   Date: 11/26/23  Time Spent: 10:05  am - 10:59 am :54 Minutes  Treatment Type: Individual Therapy.  Reported Symptoms: depression and anxiety.   Mental Status Exam: Appearance:  Casual     Behavior: Appropriate  Motor: Normal  Speech/Language:  Clear and Coherent and Normal Rate  Affect: Congruent  Mood: normal  Thought process: normal  Thought content:   WNL  Sensory/Perceptual disturbances:   WNL  Orientation: oriented to person, place, time/date, and situation  Attention: Good  Concentration: Good  Memory: WNL  Fund of knowledge:  Good  Insight:   Good  Judgment:  Good  Impulse Control: Good   Risk Assessment: Danger to Self:  No Self-injurious Behavior: No Danger to Others: No Duty to Warn:no Physical Aggression / Violence:No  Access to Firearms a concern: No  Gang Involvement:No   Subjective:   Angela Harrell participated from home, via video and consented to treatment. Therapist participated from home office. We experienced technical difficulties with video and switched to audio which was <50% of the session.  Angela Harrell reviewed the events of the past week. She noted being tired due to recent travel. 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 She noted her grandmother's recent hospitalization. She noted feeling guilt for not having a lot of feelings in relation to her hospitalization and noted a lack connection to her. She noted a history of judgmental feedback and statements from her grandmother. She noted that her mother has recently requested for an aid to spend time with her husband, Angela Harrell's father. Angela Harrell noted being thankful for this. She noted continued struggle with spending expended period of time with him due to his antagonism and sharp words. She continues to maintain boundaries with her father, as a result.  She noted the upcoming adjustment to her day-to-day once her youngest son begins to drive. She noted looking froward to having the free time and noted her intent to focus on her interest. She noted a recent conversation with a friend who grew-up in a similar household with an unsupportive father. She noted her continued effort to set boundaries with her family and with her in-laws. She noted barriers to this in the past including not having healthy relationships being modeled around her. We worked on exploring this during the session. Therapist praised Angela Harrell for her vulnerability with a friend and setting boundaries with family. Therapist validated Angela Harrell's feelings and experience, encouraged continued self-care, and provided supportive therapy. A follow-up was scheduled for continued treatment, which she benefits from.     Diagnosis:  Generalized anxiety disorder  PMDD (premenstrual dysphoric disorder)  Psychiatric Treatment: Yes , Angela Mantle, NP.    Treatment Plan:  Client Abilities/Strengths Angela Harrell is intelligent, self-aware, and motivated for change.   Support System: Family and friends.   Client Treatment Preferences Outpatient therapy and continued psychiatric treatment.   Client Statement of Needs Angela Harrell would like to engage in activities that she previously avoided due to anxiety, building and maintaining a daily routine, managing distress, managing overall symptoms, processing past events, building a larger support system, maintain sessions with psychiatric provider, being more assertive, verbalizing needs, and setting boundaries with others, managing social pressure (owing explanations or answering requests immediately).    Treatment Level Weekly  Symptoms  Depression: feeling down, poor sleep, lethargy, fluctuating appetite, feeling bad about self.   (Status: maintained) Anxiety:  Feeling  anxious, difficulty managing worry, worrying about different things, trouble  relaxing, irritability, and feeling afraid something awful might happen.   (Status: maintained)  Goals:   Ariani experiences symptoms of depression and anxiety.   Treatment plan signed and available on s-drive:   No, pending signature via MyChart.  Angela Harrell was sent the treatment plan signature form on 11/12/23.   Target Date: 11/11/24 Frequency: Weekly  Progress: 0 Modality: individual    Therapist will provide referrals for additional resources as appropriate.  Therapist will provide psycho-education regarding Angela Harrell's diagnosis and corresponding treatment approaches and interventions. Angela Boyden, [name] will support the patient's ability to achieve the goals identified. will employ CBT, BA, Problem-solving, Solution Focused, Mindfulness,  coping skills, & other evidenced-based practices will be used to promote progress towards healthy functioning to help manage decrease symptoms associated with her diagnosis.   Reduce overall level, frequency, and intensity of the feelings of depression, anxiety.  evidenced by decreased overall symptoms from 6 to 7 days/week to 0 to 1 days/week per client report for at least 3 consecutive months. Verbally express understanding of the relationship between feelings of depression, anxiety and their impact on thinking patterns and behaviors. Verbalize an understanding of the role that distorted thinking plays in creating fears, excessive worry, and ruminations.    Angela Harrell participated in the creation of the treatment plan)   Angela Boyden, [name]

## 2023-12-10 ENCOUNTER — Ambulatory Visit: Payer: BC Managed Care – PPO | Admitting: Psychology

## 2023-12-10 DIAGNOSIS — F411 Generalized anxiety disorder: Secondary | ICD-10-CM | POA: Diagnosis not present

## 2023-12-10 DIAGNOSIS — F3281 Premenstrual dysphoric disorder: Secondary | ICD-10-CM | POA: Diagnosis not present

## 2023-12-10 NOTE — Progress Notes (Signed)
  [facility] Counselor/Therapist Progress Note  Patient ID: Angela Harrell, MRN: [id number]   Date: 12/10/23  Time Spent: 10:03  am - 11:02 am :59 Minutes  Treatment Type: Individual Therapy.  Reported Symptoms: depression and anxiety.   Mental Status Exam: Appearance:  Casual     Behavior: Appropriate  Motor: Normal  Speech/Language:  Clear and Coherent and Normal Rate  Affect: Congruent  Mood: normal  Thought process: normal  Thought content:   WNL  Sensory/Perceptual disturbances:   WNL  Orientation: oriented to person, place, time/date, and situation  Attention: Good  Concentration: Good  Memory: WNL  Fund of knowledge:  Good  Insight:   Good  Judgment:  Good  Impulse Control: Good   Risk Assessment: Danger to Self:  No Self-injurious Behavior: No Danger to Others: No Duty to Warn:no Physical Aggression / Violence:No  Access to Firearms a concern: No  Gang Involvement:No   Subjective:   Angela Harrell participated from home, via video and consented to treatment. Therapist participated from home office. She discussed the events of the past week. She noted not sleeping well last night. She noted experiencing a recent sense of dread and feeling unease. We worked on identifying possible contributing factors and Angela Harrell noted the possibility of her cycle influencing her mood. She noted her attempt to take a walk, engage in enjoyable activities (gardening), speaking with a friend and noted this being somewhat helpful. She noted recently having a disagreement with her husband. We worked on exploring this experience and processing her feelings regarding this. She noted feeling frustration for not receiving credits for her significant effort in a joint project. She noted frustration regarding his approach including tone and being directive. Therapist highlighted Angela Harrell opportunities to address one aspect of the situation and we worked on exploring the pros and cons of  her approach. She noted her family's continued reliance on her for the emotional labor and noted a need for a change in this area. We worked on identifying any possible contributing factors to this dynamic. She noted her efforts to set boundaries and redirect others towards being more self-sufficient. Therapist encouraged continued effort in this area going forward. She noted the worsening of her father's symptoms and functioning and the effects of this on the family. She noted that his general approach becoming more antagonistic. She noted struggling to be empathetic and sympathetic with her father, as a result. Therapist validated Angela Harrell's feelings and experience during the session, encouraged self-care and boundaries, and provided supportive therapy. A follow-up was scheduled for continued treatment which she benefits.     Diagnosis:  Generalized anxiety disorder  PMDD (premenstrual dysphoric disorder)  Psychiatric Treatment: Yes , Angela Mantle, NP.    Treatment Plan:  Client Abilities/Strengths Angela Harrell is intelligent, self-aware, and motivated for change.   Support System: Family and friends.   Client Treatment Preferences Outpatient therapy and continued psychiatric treatment.   Client Statement of Needs Angela Harrell would like to engage in activities that she previously avoided due to anxiety, building and maintaining a daily routine, managing distress, managing overall symptoms, processing past events, building a larger support system, maintain sessions with psychiatric provider, being more assertive, verbalizing needs, and setting boundaries with others, managing social pressure (owing explanations or answering requests immediately).    Treatment Level Weekly  Symptoms  Depression: feeling down, poor sleep, lethargy, fluctuating appetite, feeling bad about self.   (Status: maintained) Anxiety:  Feeling anxious, difficulty managing worry, worrying about different things, trouble  relaxing, irritability,  and feeling afraid something awful might happen.   (Status: maintained)  Goals:   Angela Harrell experiences symptoms of depression and anxiety.   Treatment plan signed and available on s-drive:   No, pending signature via MyChart.  Angela Harrell was sent the treatment plan signature form on 11/12/23.   Target Date: 11/11/24 Frequency: Weekly  Progress: 0 Modality: individual    Therapist will provide referrals for additional resources as appropriate.  Therapist will provide psycho-education regarding Angela Harrell's diagnosis and corresponding treatment approaches and interventions. Angela Harrell, Angela Harrell will support the patient's ability to achieve the goals identified. will employ CBT, BA, Problem-solving, Solution Focused, Mindfulness,  coping skills, & other evidenced-based practices will be used to promote progress towards healthy functioning to help manage decrease symptoms associated with her diagnosis.   Reduce overall level, frequency, and intensity of the feelings of depression, anxiety.  evidenced by decreased overall symptoms from 6 to 7 days/week to 0 to 1 days/week per client report for at least 3 consecutive months. Verbally express understanding of the relationship between feelings of depression, anxiety and their impact on thinking patterns and behaviors. Verbalize an understanding of the role that distorted thinking plays in creating fears, excessive worry, and ruminations.    Angela Harrell participated in the creation of the treatment plan)   Angela Harrell, Angela Harrell

## 2023-12-24 ENCOUNTER — Ambulatory Visit: Payer: BC Managed Care – PPO | Admitting: Psychology

## 2023-12-24 DIAGNOSIS — F3281 Premenstrual dysphoric disorder: Secondary | ICD-10-CM

## 2023-12-24 DIAGNOSIS — F411 Generalized anxiety disorder: Secondary | ICD-10-CM | POA: Diagnosis not present

## 2023-12-24 NOTE — Progress Notes (Signed)
 Angela Harrell  Patient ID: Angela Harrell, MRN: [id number]   Date: 12/24/23  Time Spent: 10:08 am - 11:01 am :53 Minutes  Treatment Type: Individual Therapy.  Reported Symptoms: depression and anxiety.   Mental Status Exam: Appearance:  Casual     Behavior: Appropriate  Motor: Normal  Speech/Language:  Clear and Coherent and Normal Rate  Affect: Tearful  Mood: sad  Thought process: normal  Thought content:   WNL  Sensory/Perceptual disturbances:   WNL  Orientation: oriented to person, place, time/date, and situation  Attention: Good  Concentration: Good  Memory: WNL  Fund of knowledge:  Good  Insight:   Good  Judgment:  Good  Impulse Control: Good   Risk Assessment: Danger to Self:  No Self-injurious Behavior: No Danger to Others: No Duty to Warn:no Physical Aggression / Violence:No  Access to Firearms a concern: No  Gang Involvement:No   Subjective:   Angela Harrell participated from home, via video and consented to treatment. Therapist participated from home office. She discussed the events of the past week. Angela Harrell noted her husband's recent hospitalization do to a cardiological issue. She noted a need for additional testing. She noted her and her husband getting into a disagreement and noted her husband's frustration regarding her not being supportive enough. Angela Harrell noted feeling overwhelmed and stretched thin and doing what she could with the level of energy she has. She noted her output towards family members and was tearful during the session. She noted a general lack of communication and this resulting in an increase in disagreements. We explored this during the session. Therapist encouraged couple's counseling and will provided a resources upon request. Therapist highlighted Angela Harrell's consistent effort to manage the moods of those around her, specifically her husband and children and be directive regarding household chores.  Angela Harrell noted this was a role/position that she had since childhood, having to manage other people's mood, due to her father's behavior. She noted her father's parenting during her childhood which often was antagonistic, demonstrative, and biting. She noted her father often seething and affecting the home atmosphere noted that her father did not communicate about his moods, needs, and frustrations. We worked on processing this during the session. Therapist encouraged Angela Harrell to identify boundaries for herself and others regarding managing people's mood, schedules, and reducing the emotional labor going forward. Angela Harrell noted the effect of this on her mood and was tearful, at times. Angela Harrell noted her effort to socialize with others and noted recently spending time with friends, which therapist praised Angela Harrell was engaged and motivated during the session. She expressed commitment towards goals. Therapist praised Angela Harrell for her effort and energy and provided supportive therapy.   Diagnosis:  Generalized anxiety disorder  PMDD (premenstrual dysphoric disorder)  Psychiatric Treatment: Yes , Angela Harrell, Angela Harrell.    Treatment Plan:  Client Abilities/Strengths Angela Harrell is intelligent, self-aware, and motivated for change.   Support System: Family and friends.   Client Treatment Preferences Outpatient therapy and continued psychiatric treatment.   Client Statement of Needs Angela Harrell would like to engage in activities that she previously avoided due to anxiety, building and maintaining a daily routine, managing distress, managing overall symptoms, processing past events, building a larger support system, maintain sessions with psychiatric provider, being more assertive, verbalizing needs, and setting boundaries with others, managing social pressure (owing explanations or answering requests immediately).    Treatment Level Weekly  Symptoms  Depression: feeling down, poor sleep, lethargy, fluctuating appetite,  feeling bad about self.   (  Status: maintained) Anxiety:  Feeling anxious, difficulty managing worry, worrying about different things, trouble relaxing, irritability, and feeling afraid something awful might happen.   (Status: maintained)  Goals:   Angela Harrell experiences symptoms of depression and anxiety.   Treatment plan signed and available on s-drive:   No, pending signature page.  Angela Harrell was sent the treatment plan signature form on 11/12/23.   Target Date: 11/11/24 Frequency: Weekly  Progress: 0 Modality: individual    Therapist will provide referrals for additional resources as appropriate.  Therapist will provide psycho-education regarding Mckenize's diagnosis and corresponding treatment approaches and interventions. Angela Harrell, Angela Harrell will support the patient's ability to achieve the goals identified. will employ CBT, BA, Problem-solving, Solution Focused, Mindfulness,  coping skills, & other evidenced-based practices will be used to promote progress towards healthy functioning to help manage decrease symptoms associated with her diagnosis.   Reduce overall level, frequency, and intensity of the feelings of depression, anxiety.  evidenced by decreased overall symptoms from 6 to 7 days/week to 0 to 1 days/week per client report for at least 3 consecutive months. Verbally express understanding of the relationship between feelings of depression, anxiety and their impact on thinking patterns and behaviors. Verbalize an understanding of the role that distorted thinking plays in creating fears, excessive worry, and ruminations.    Angela Harrell participated in the creation of the treatment plan)   Angela Harrell, Angela Harrell

## 2024-01-05 ENCOUNTER — Other Ambulatory Visit: Payer: Self-pay | Admitting: Adult Health

## 2024-01-05 DIAGNOSIS — F3281 Premenstrual dysphoric disorder: Secondary | ICD-10-CM

## 2024-01-05 DIAGNOSIS — F411 Generalized anxiety disorder: Secondary | ICD-10-CM

## 2024-01-07 ENCOUNTER — Ambulatory Visit (INDEPENDENT_AMBULATORY_CARE_PROVIDER_SITE_OTHER): Payer: BC Managed Care – PPO | Admitting: Psychology

## 2024-01-07 DIAGNOSIS — F411 Generalized anxiety disorder: Secondary | ICD-10-CM

## 2024-01-07 DIAGNOSIS — F3281 Premenstrual dysphoric disorder: Secondary | ICD-10-CM

## 2024-01-07 NOTE — Progress Notes (Signed)
 [facility] Counselor/Therapist Progress Note  Patient ID: Gilberto Streck, MRN: [id number]   Date: 01/07/24  Time Spent: 10:08 am - 11:05  am :57 Minutes  Treatment Type: Individual Therapy.  Reported Symptoms: depression and anxiety.   Mental Status Exam:  Appearance:  Casual     Behavior: Appropriate  Motor: Normal  Speech/Language:  Clear and Coherent and Normal Rate  Affect: Tearful  Mood: sad  Thought process: normal  Thought content:   WNL  Sensory/Perceptual disturbances:   WNL  Orientation: oriented to person, place, time/date, and situation  Attention: Good  Concentration: Good  Memory: WNL  Fund of knowledge:  Good  Insight:   Good  Judgment:  Good  Impulse Control: Good   Risk Assessment: Danger to Self:  No Self-injurious Behavior: No Danger to Others: No Duty to Warn:no Physical Aggression / Violence:No  Access to Firearms a concern: No  Gang Involvement:No   Subjective:   [name] participated from home, via video and consented to treatment. Therapist participated from home office. She discussed the events of the past week. She noted her effort to communicate with her husband regarding the balance of household chores including negotiation of tasks and discussed her concerns about the load for emotional labor. We worked on processing this during the session Therapist encouraged David to work additional communication to create a framework that allows providing each other feedback, flexibility to make changes, and consistent check-ins. Therapist modeled this during the session.  Glender was open to this during the session and expressed commitment towards goals. She noted frustration regarding familial judgement regarding various decision making despite these decisions not affecting others. We explored this during the session and Sreeja noted a need to get better about not engaging in these discussions. Therapist modeled this during the session and  discussed various ways to navigate this stressor. Laci was provided a list of local marriage counselors, via email, and more will be provided upon request. Cailee was engaged and motivated during the session. She expressed commitment towards goals, which therapist praised.   Diagnosis:  Generalized anxiety disorder  PMDD (premenstrual dysphoric disorder)  Psychiatric Treatment: Yes , [name], NP.    Treatment Plan:  Client Abilities/Strengths Katye is intelligent, self-aware, and motivated for change.   Support System: Family and friends.   Client Treatment Preferences Outpatient therapy and continued psychiatric treatment.   Client Statement of Needs [name] would like to engage in activities that she previously avoided due to anxiety, building and maintaining a daily routine, managing distress, managing overall symptoms, processing past events, building a larger support system, maintain sessions with psychiatric provider, being more assertive, verbalizing needs, and setting boundaries with others, managing social pressure (owing explanations or answering requests immediately).    Treatment Level Weekly  Symptoms  Depression: feeling down, poor sleep, lethargy, fluctuating appetite, feeling bad about self.   (Status: maintained) Anxiety:  Feeling anxious, difficulty managing worry, worrying about different things, trouble relaxing, irritability, and feeling afraid something awful might happen.   (Status: maintained)  Goals:   Jesiah experiences symptoms of depression and anxiety.   Treatment plan signed and available on s-drive:   No, pending signature page.  Angelissa Supan was sent the treatment plan signature form on 11/12/23.   Target Date: 11/11/24 Frequency: Weekly  Progress: 0 Modality: individual    Therapist will provide referrals for additional resources as appropriate.  Therapist will provide psycho-education regarding Dalphine's diagnosis and corresponding  treatment approaches and interventions. [name], [name]  will support the patient's ability to achieve the goals identified. will employ CBT, BA, Problem-solving, Solution Focused, Mindfulness,  coping skills, & other evidenced-based practices will be used to promote progress towards healthy functioning to help manage decrease symptoms associated with her diagnosis.   Reduce overall level, frequency, and intensity of the feelings of depression, anxiety.  evidenced by decreased overall symptoms from 6 to 7 days/week to 0 to 1 days/week per client report for at least 3 consecutive months. Verbally express understanding of the relationship between feelings of depression, anxiety and their impact on thinking patterns and behaviors. Verbalize an understanding of the role that distorted thinking plays in creating fears, excessive worry, and ruminations.    [name] participated in the creation of the treatment plan)   [name], [name]

## 2024-01-09 ENCOUNTER — Ambulatory Visit: Payer: Self-pay | Admitting: Family Medicine

## 2024-01-09 ENCOUNTER — Other Ambulatory Visit: Payer: Self-pay

## 2024-01-09 ENCOUNTER — Ambulatory Visit (INDEPENDENT_AMBULATORY_CARE_PROVIDER_SITE_OTHER): Admitting: Family Medicine

## 2024-01-09 ENCOUNTER — Encounter: Payer: Self-pay | Admitting: Family Medicine

## 2024-01-09 VITALS — BP 120/80 | HR 100 | Temp 98.8°F | Resp 12 | Ht 63.0 in | Wt 211.0 lb

## 2024-01-09 DIAGNOSIS — E559 Vitamin D deficiency, unspecified: Secondary | ICD-10-CM

## 2024-01-09 DIAGNOSIS — R7303 Prediabetes: Secondary | ICD-10-CM

## 2024-01-09 DIAGNOSIS — Z0000 Encounter for general adult medical examination without abnormal findings: Secondary | ICD-10-CM | POA: Insufficient documentation

## 2024-01-09 DIAGNOSIS — H9201 Otalgia, right ear: Secondary | ICD-10-CM

## 2024-01-09 DIAGNOSIS — E782 Mixed hyperlipidemia: Secondary | ICD-10-CM

## 2024-01-09 DIAGNOSIS — F419 Anxiety disorder, unspecified: Secondary | ICD-10-CM

## 2024-01-09 DIAGNOSIS — J4520 Mild intermittent asthma, uncomplicated: Secondary | ICD-10-CM

## 2024-01-09 DIAGNOSIS — J302 Other seasonal allergic rhinitis: Secondary | ICD-10-CM

## 2024-01-09 LAB — LIPID PANEL
Cholesterol: 207 mg/dL — ABNORMAL HIGH (ref 0–200)
HDL: 41.1 mg/dL (ref >39.00)
LDL Cholesterol: 105 mg/dL — ABNORMAL HIGH (ref 0–99)
NonHDL: 165.48
Total CHOL/HDL Ratio: 5
Triglycerides: 304 mg/dL — ABNORMAL HIGH (ref 0.0–149.0)
VLDL: 60.8 mg/dL — ABNORMAL HIGH (ref 0.0–40.0)

## 2024-01-09 LAB — COMPREHENSIVE METABOLIC PANEL WITH GFR
ALT: 23 U/L (ref 0–35)
AST: 23 U/L (ref 0–37)
Albumin: 4.5 g/dL (ref 3.5–5.2)
Alkaline Phosphatase: 69 U/L (ref 39–117)
BUN: 12 mg/dL (ref 6–23)
CO2: 28 meq/L (ref 19–32)
Calcium: 9.8 mg/dL (ref 8.4–10.5)
Chloride: 102 meq/L (ref 96–112)
Creatinine, Ser: 0.77 mg/dL (ref 0.40–1.20)
GFR: 92.04 mL/min (ref >60.00)
Glucose, Bld: 112 mg/dL — ABNORMAL HIGH (ref 70–99)
Potassium: 4.3 meq/L (ref 3.5–5.1)
Sodium: 136 meq/L (ref 135–145)
Total Bilirubin: 0.5 mg/dL (ref 0.2–1.2)
Total Protein: 7.6 g/dL (ref 6.0–8.3)

## 2024-01-09 LAB — HEMOGLOBIN A1C: Hgb A1c MFr Bld: 6.5 % (ref 4.6–6.5)

## 2024-01-09 LAB — VITAMIN D 25 HYDROXY (VIT D DEFICIENCY, FRACTURES): VITD: 25.12 ng/mL — ABNORMAL LOW (ref 30.00–100.00)

## 2024-01-09 MED ORDER — ALBUTEROL SULFATE HFA 108 (90 BASE) MCG/ACT IN AERS: 2.0000 | INHALATION_SPRAY | Freq: Four times a day (QID) | RESPIRATORY_TRACT | 0 refills | Status: AC | PRN

## 2024-01-09 MED ORDER — ROSUVASTATIN CALCIUM 10 MG PO TABS: 10.0000 mg | ORAL_TABLET | Freq: Every day | ORAL | 3 refills | Status: AC

## 2024-01-09 NOTE — Assessment & Plan Note (Signed)
 We discussed the importance of regular physical activity and healthy diet for prevention of chronic illness and/or complications. Preventive guidelines reviewed. Vaccination up to date. She is going to establish with new gynecologist. Next CPE in a year.

## 2024-01-09 NOTE — Assessment & Plan Note (Signed)
Continue OTC vit D 2000 U daily. Further recommendations according to 25 [place] vit D result.

## 2024-01-09 NOTE — Patient Instructions (Addendum)
 A few things to remember from today's visit:  Routine general medical examination at a health care facility  Hyperlipemia, mixed - Plan: Comprehensive metabolic panel with GFR, Lipid panel, rosuvastatin  (CRESTOR ) 10 MG tablet  Anxiety disorder, unspecified type  Vitamin D  deficiency, unspecified - Plan: VITAMIN D  25 Hydroxy (Vit-D Deficiency, Fractures)  Prediabetes - Plan: Hemoglobin A1c  Cervical cancer screening  Mild intermittent asthma without complication - Plan: Ambulatory referral to Immunology  Seasonal allergic rhinitis, unspecified trigger - Plan: Ambulatory referral to Immunology  No changes today. If you need refills for medications you take chronically, please call your pharmacy. Do not use My Chart to request refills or for acute issues that need immediate attention. If you send a my chart message, it may take a few days to be addressed, specially if I am not in the office.  Please be sure medication list is accurate. If a new problem present, please set up appointment sooner than planned today.  Health Maintenance, Female Adopting a healthy lifestyle and getting preventive care are important in promoting health and wellness. Ask your health care provider about: The right schedule for you to have regular tests and exams. Things you can do on your own to prevent diseases and keep yourself healthy. What should I know about diet, weight, and exercise? Eat a healthy diet  Eat a diet that includes plenty of vegetables, fruits, low-fat dairy products, and lean protein. Do not eat a lot of foods that are high in solid fats, added sugars, or sodium. Maintain a healthy weight Body mass index (BMI) is used to identify weight problems. It estimates body fat based on height and weight. Your health care provider can help determine your BMI and help you achieve or maintain a healthy weight. Get regular exercise Get regular exercise. This is one of the most important things you  can do for your health. Most adults should: Exercise for at least 150 minutes each week. The exercise should increase your heart rate and make you sweat (moderate-intensity exercise). Do strengthening exercises at least twice a week. This is in addition to the moderate-intensity exercise. Spend less time sitting. Even light physical activity can be beneficial. Watch cholesterol and blood lipids Have your blood tested for lipids and cholesterol at 47 years of age, then have this test every 5 years. Have your cholesterol levels checked more often if: Your lipid or cholesterol levels are high. You are older than 47 years of age. You are at high risk for heart disease. What should I know about cancer screening? Depending on your health history and family history, you may need to have cancer screening at various ages. This may include screening for: Breast cancer. Cervical cancer. Colorectal cancer. Skin cancer. Lung cancer. What should I know about heart disease, diabetes, and high blood pressure? Blood pressure and heart disease High blood pressure causes heart disease and increases the risk of stroke. This is more likely to develop in people who have high blood pressure readings or are overweight. Have your blood pressure checked: Every 3-5 years if you are 38-80 years of age. Every year if you are 71 years old or older. Diabetes Have regular diabetes screenings. This checks your fasting blood sugar level. Have the screening done: Once every three years after age 41 if you are at a normal weight and have a low risk for diabetes. More often and at a younger age if you are overweight or have a high risk for diabetes. What should I  know about preventing infection? Hepatitis B If you have a higher risk for hepatitis B, you should be screened for this virus. Talk with your health care provider to find out if you are at risk for hepatitis B infection. Hepatitis C Testing is recommended  for: Everyone born from [birth year] through 1964. Anyone with known risk factors for hepatitis C. Sexually transmitted infections (STIs) Get screened for STIs, including gonorrhea and chlamydia, if: You are sexually active and are younger than 47 years of age. You are older than 46 years of age and your health care provider tells you that you are at risk for this type of infection. Your sexual activity has changed since you were last screened, and you are at increased risk for chlamydia or gonorrhea. Ask your health care provider if you are at risk. Ask your health care provider about whether you are at high risk for HIV. Your health care provider may recommend a prescription medicine to help prevent HIV infection. If you choose to take medicine to prevent HIV, you should first get tested for HIV. You should then be tested every 3 months for as long as you are taking the medicine. Pregnancy If you are about to stop having your period (premenopausal) and you may become pregnant, seek counseling before you get pregnant. Take 400 to 800 micrograms (mcg) of folic acid every day if you become pregnant. Ask for birth control (contraception) if you want to prevent pregnancy. Osteoporosis and menopause Osteoporosis is a disease in which the bones lose minerals and strength with aging. This can result in bone fractures. If you are 31 years old or older, or if you are at risk for osteoporosis and fractures, ask your health care provider if you should: Be screened for bone loss. Take a calcium  or vitamin D  supplement to lower your risk of fractures. Be given hormone replacement therapy (HRT) to treat symptoms of menopause. Follow these instructions at home: Alcohol use Do not drink alcohol if: Your health care provider tells you not to drink. You are pregnant, may be pregnant, or are planning to become pregnant. If you drink alcohol: Limit how much you have to: 0-1 drink a day. Know how much alcohol is in  your drink. In the [place] one drink equals one 12 oz bottle of beer (355 mL), one 5 oz glass of wine (148 mL), or one 1 oz glass of hard liquor (44 mL). Lifestyle Do not use any products that contain nicotine or tobacco. These products include cigarettes, chewing tobacco, and vaping devices, such as e-cigarettes. If you need help quitting, ask your health care provider. Do not use street drugs. Do not share needles. Ask your health care provider for help if you need support or information about quitting drugs. General instructions Schedule regular health, dental, and eye exams. Stay current with your vaccines. Tell your health care provider if: You often feel depressed. You have ever been abused or do not feel safe at home. Summary Adopting a healthy lifestyle and getting preventive care are important in promoting health and wellness. Follow your health care provider's instructions about healthy diet, exercising, and getting tested or screened for diseases. Follow your health care provider's instructions on monitoring your cholesterol and blood pressure. This information is not intended to replace advice given to you by your health care provider. Make sure you discuss any questions you have with your health care provider. Document Revised: 11/06/2020 Document Reviewed: 11/06/2020 Elsevier Patient Education  2024 [employer].

## 2024-01-09 NOTE — Assessment & Plan Note (Signed)
 Problem is not adequately controlled. Immunology referral placed as requested.

## 2024-01-09 NOTE — Assessment & Plan Note (Signed)
 Continue Rosuvastatin  10 mg daily and low fat diet. Further recommendations according to FLP results. Follow up in a year.

## 2024-01-09 NOTE — Progress Notes (Signed)
 Discussed the use of AI scribe software for clinical note transcription with the patient, who gave verbal consent to proceed.  History of Present Illness Angela Harrell is a 48 year old female who presents for an annual physical exam.  Last CPE 10/10/20. Since her last visit she has followed with her psychiatrist.  She maintains a regular exercise routine, focusing on low-impact exercises like push-ups and chair exercises to avoid back injuries. She exercises daily for a few minutes, gradually increasing her repetitions.  Her diet consists mainly of home-cooked meals with minimal salt, and she has a vegetable garden.  She sleeps approximately six hours per night and takes sertraline , which has helped stabilize her mood swings. She does not consume alcohol or smoke.  Health Maintenance  Topic Date Due   Pap with HPV screening  12/04/2012   Hepatitis B Vaccine (1 of 3 - 19+ 3-dose series) 01/08/2025*   Flu Shot  01/30/2024   DTaP/Tdap/Td vaccine (3 - Td or Tdap) 11/05/2026   Colon Cancer Screening  11/25/2032   COVID-19 Vaccine  Completed   Hepatitis C Screening  Completed   HIV Screening  Completed   HPV Vaccine  Aged Out   Meningitis B Vaccine  Aged Out   Pneumococcal Vaccination  Discontinued  *Topic was postponed. The date shown is not the original due date.   Immunization History  Administered Date(s) Administered   Hepatitis A 08/22/2009, 01/08/2010   Influenza Inj Mdck Quad Pf 03/27/2018, 04/23/2021   Influenza,inj,Quad PF,6+ Mos 05/07/2016, 05/07/2017, 04/06/2019   Influenza-Unspecified 03/31/2018   Janssen (J&J) SARS-COV-2 Vaccination 10/08/2019   Moderna Sars-Covid-2 Vaccination 05/22/2020   Pfizer(Comirnaty)Fall Seasonal Vaccine 12 years and older 05/28/2023   Tdap 03/31/2006, 11/04/2016   She has been experiencing ongoing fatigue and low energy levels since a severe illness in January, which lasted for a month. During that time, she had a fever between 102 and 103  degrees Fahrenheit, a persistent cough, and was diagnosed with strep throat. States that the first antibiotic was ineffective, and she was subsequently treated with a second antibiotic and hydrocodone  for the cough. The illness resolved after several weeks, but she still experiences occasional coughing. No wheezing or difficulty breathing. She occasionally uses an albuterol  inhaler, especially when her cough worsens.  Since March, she has noticed changes in her menstrual cycle, with cycles becoming more frequent and lasting longer. She describes feeling 'weepy, tired, and sad' on the first day of her cycle and has noticed mood swings, breast enlargement, and increased fatigue. Her last menstrual period was on June 28. She has not seen her gynecologist and trying to establish with a new provider.  She has a history of allergies and asthma, which have been exacerbated by environmental factors such as pollen. She has not been formally tested for allergies but suspects she has food and pollen allergies. Right earache, which she mentions it happens intermittently and self-limited, she has not noted ear drainage or changes in hearing.  Depression and anxiety: Follows with psychiatrist. She mentions that she has a family history of substance abuse, with one brother being an alcoholic and another a drug addict. Her father has Alzheimer's disease, which has been a source of stress for her. She sees a psychotherapist every two weeks to manage her stress and mental health.  She takes rosuvastatin  10 mg daily for cholesterol management and vitamin D  supplementation for vit D deficiency.  She has not had a recent kidney stone episode but has a history of  them.  She follows with urologist as needed.  Component     Latest Ref Rng 10/07/2022  Cholesterol     0 - 200 mg/dL 835   Triglycerides     0.0 - 149.0 mg/dL 860.9   HDL Cholesterol     >39.00 mg/dL 59.49   VLDL     0.0 - 40.0 mg/dL 72.1   Total CHOL/HDL  Ratio 4   NonHDL 123.16   LDL (calc)     0 - 99 mg/dL 95    25 OH vitamin D  was 41 in 09/2022.  Prediabetes: Negative for polyuria, polydipsia, polyphagia. Last hemoglobin A1c was 6.2 in 09/2022.  Review of Systems  Constitutional:  Positive for fatigue. Negative for activity change, appetite change and fever.  HENT:  Positive for congestion, ear pain and postnasal drip. Negative for mouth sores, sore throat and trouble swallowing.   Eyes:  Negative for redness and visual disturbance.  Respiratory:  Positive for cough. Negative for shortness of breath and wheezing.   Cardiovascular:  Negative for chest pain, palpitations and leg swelling.  Gastrointestinal:  Negative for abdominal pain, nausea and vomiting.  Endocrine: Negative for cold intolerance and heat intolerance.  Genitourinary:  Positive for menstrual problem. Negative for decreased urine volume, dysuria and hematuria.  Musculoskeletal:  Negative for gait problem and myalgias.  Skin:  Negative for color change and rash.  Allergic/Immunologic: Positive for environmental allergies.  Neurological:  Negative for syncope, weakness and headaches.  Hematological:  Negative for adenopathy. Does not bruise/bleed easily.  Psychiatric/Behavioral:  Negative for confusion and hallucinations.   All other systems reviewed and are negative.  Current Outpatient Medications on File Prior to Visit  Medication Sig Dispense Refill   albuterol  (PROVENTIL  HFA) 108 (90 Base) MCG/ACT inhaler Inhale 2 puffs into the lungs every 6 (six) hours as needed for wheezing or shortness of breath. 1 each 0   Cholecalciferol (VITAMIN D3 PO) Take 2,000 mg by mouth daily.      MAGNESIUM PO Take by mouth.     Omega-3 Fatty Acids (FISH OIL) 1000 MG CAPS Take by mouth.     Probiotic Product (PROBIOTIC PO) Take 1 tablet by mouth every other day.     PSYLLIUM HUSK PO Take 2 capsules by mouth daily.     rosuvastatin  (CRESTOR ) 10 MG tablet TAKE 1 TABLET BY MOUTH EVERY  DAY CALL FOR FOLLOW UP VISIT [phone] 30 tablet 0   Triamcinolone  Acetonide (TRIAMCINOLONE  0.1 % CREAM : EUCERIN) CREA Apply 1 application topically 2 (two) times daily as needed for rash or itching. 500 each 1   Turmeric Curcumin 500 MG CAPS Take by mouth.     Current Facility-Administered Medications on File Prior to Visit  Medication Dose Route Frequency Provider Last Rate Last Admin   0.9 %  sodium chloride  infusion  500 mL Intravenous Continuous Cirigliano, Vito V, DO        Past Medical History:  Diagnosis Date   ALLERGIC RHINITIS 05/31/2008   Anemia    ANXIETY 05/31/2008   ASTHMA 05/31/2008   Asthma    CHOLELITHIASIS 05/31/2008   High blood pressure    readings with stones   History of kidney stones    History of right breast biopsy 2017   HYPERLIPIDEMIA 05/31/2008   Kidney stone    NEPHROLITHIASIS, HX OF 05/31/2008   Past Surgical History:  Procedure Laterality Date   CESAREAN SECTION  2003,2005,2007   CHOLECYSTECTOMY  1999   KIDNEY STONE SURGERY  ORIF CONGENITAL HIP DISLOCATION     poly syst removal     1998   Allergies  Allergen Reactions   Beef-Derived Drug Products Diarrhea    Nausea and vomiting   Prochlorperazine Edisylate Shortness Of Breath    Compazine Tongue swelling   Prochlorperazine Edisylate Swelling    Swelling of throat   Tree Extract Anaphylaxis    Throat itchy, roof of mouth gets raw   Banana Itching and Nausea And Vomiting    Throat gets itchy   Beef (Bovine) Protein     Diarrhea and stomach cramps    Oxycodone-Acetaminophen      Vomiting   Permethrin Rash    Rash and fever   Statins Itching   Morphine And Codeine Itching   Other     Tree Nuts (can have peanuts and cashews) Pyrethrine (ingredient in lice products)- fever and rash   Oxycodone-Acetaminophen  Nausea And Vomiting   Latex Hives and Rash    Rash    Family History  Problem Relation Age of Onset   Breast cancer Mother 6   Arthritis Mother    Hyperlipidemia  Father        triglycerides   Mental illness Brother        bipolar   Asthma Brother    Cancer Maternal Uncle        mult myeloma   Vascular Disease Maternal Uncle    Heart disease Paternal Grandfather    Allergic Disorder Daughter    Colon cancer Neg Hx    Colon polyps Neg Hx    Esophageal cancer Neg Hx    Stomach cancer Neg Hx    Rectal cancer Neg Hx     Social History   Socioeconomic History   Marital status: Married    Spouse name: Not on file   Number of children: 3   Years of education: BS   Highest education level: Bachelor's degree (e.g., BA, AB, BS)  Occupational History   Occupation: Metallurgist   Occupation: Substitute  Tobacco Use   Smoking status: Never   Smokeless tobacco: Never  Substance and Sexual Activity   Alcohol use: No   Drug use: No   Sexual activity: Yes    Birth control/protection: Surgical  Other Topics Concern   Not on file  Social History Narrative   Lives with husband & 3 kids, work at home, PT outside of home. Often volunteers.   Social Drivers of Corporate investment banker Strain: Low Risk  (01/07/2024)   Overall Financial Resource Strain (CARDIA)    Difficulty of Paying Living Expenses: Not very hard  Food Insecurity: No Food Insecurity (01/07/2024)   Hunger Vital Sign    Worried About Running Out of Food in the Last Year: Never true    Ran Out of Food in the Last Year: Never true  Transportation Needs: No Transportation Needs (01/07/2024)   PRAPARE - Administrator, Civil Service (Medical): No    Lack of Transportation (Non-Medical): No  Physical Activity: Unknown (01/07/2024)   Exercise Vital Sign    Days of Exercise per Week: 5 days    Minutes of Exercise per Session: Patient declined  Stress: Patient Declined (01/07/2024)   [employer] of Occupational Health - Occupational Stress Questionnaire    Feeling of Stress: Patient declined  Social Connections: Socially Integrated (01/07/2024)   Social Connection and Isolation  Panel    Frequency of Communication with Friends and Family: More than three times a week  Frequency of Social Gatherings with Friends and Family: Once a week    Attends Religious Services: More than 4 times per year    Active Member of Clubs or Organizations: Yes    Attends Banker Meetings: More than 4 times per year    Marital Status: Married   Vitals:   01/09/24 0959  BP: 120/80  Pulse: 100  Resp: 12  Temp: 98.8 F (37.1 C)  SpO2: 98%   Body mass index is 37.38 kg/m.  Wt Readings from Last 3 Encounters:  01/09/24 211 lb (95.7 kg)  07/21/23 206 lb (93.4 kg)  07/18/23 210 lb (95.3 kg)   Physical Exam Vitals and nursing note reviewed.  Constitutional:      General: She is not in acute distress.    Appearance: She is well-developed.  HENT:     Head: Normocephalic and atraumatic.     Right Ear: Ear canal and external ear normal. A middle ear effusion is present. Tympanic membrane is not erythematous.     Left Ear: Tympanic membrane, ear canal and external ear normal.     Mouth/Throat:     Mouth: Mucous membranes are moist.     Pharynx: Oropharynx is clear. Uvula midline.  Eyes:     Extraocular Movements: Extraocular movements intact.     Conjunctiva/sclera: Conjunctivae normal.     Pupils: Pupils are equal, round, and reactive to light.  Neck:     Thyroid : No thyromegaly.     Trachea: No tracheal deviation.  Cardiovascular:     Rate and Rhythm: Normal rate and regular rhythm.     Pulses:          Dorsalis pedis pulses are 2+ on the right side and 2+ on the left side.     Heart sounds: No murmur heard. Pulmonary:     Effort: Pulmonary effort is normal. No respiratory distress.     Breath sounds: Normal breath sounds.  Abdominal:     Palpations: Abdomen is soft. There is no hepatomegaly or mass.     Tenderness: There is no abdominal tenderness.  Genitourinary:    Comments: Deferred to gyn. Musculoskeletal:     Comments: No major deformity or signs  of synovitis appreciated.  Lymphadenopathy:     Cervical: No cervical adenopathy.     Upper Body:     Right upper body: No supraclavicular adenopathy.     Left upper body: No supraclavicular adenopathy.  Skin:    General: Skin is warm.     Findings: No erythema or rash.  Neurological:     General: No focal deficit present.     Mental Status: She is alert and oriented to person, place, and time.     Cranial Nerves: No cranial nerve deficit.     Coordination: Coordination normal.     Gait: Gait normal.     Deep Tendon Reflexes:     Reflex Scores:      Bicep reflexes are 2+ on the right side and 2+ on the left side.      Patellar reflexes are 2+ on the right side and 2+ on the left side. Psychiatric:        Mood and Affect: Affect normal. Mood is anxious.   ASSESSMENT AND PLAN: Ms. Angela Harrell was here today annual physical examination.  Orders Placed This Encounter  Procedures   Comprehensive metabolic panel with GFR   VITAMIN D  25 Hydroxy (Vit-D Deficiency, Fractures)   Hemoglobin A1c   Lipid  panel   Lab Results  Component Value Date   CHOL 207 (H) 01/09/2024   HDL 41.10 01/09/2024   LDLCALC 105 (H) 01/09/2024   LDLDIRECT 174.0 10/10/2020   TRIG 304.0 (H) 01/09/2024   CHOLHDL 5 01/09/2024   Lab Results  Component Value Date   NA 136 01/09/2024   CL 102 01/09/2024   K 4.3 01/09/2024   CO2 28 01/09/2024   BUN 12 01/09/2024   CREATININE 0.77 01/09/2024   GFR 92.04 01/09/2024   CALCIUM  9.8 01/09/2024   ALBUMIN 4.5 01/09/2024   GLUCOSE 112 (H) 01/09/2024   Lab Results  Component Value Date   ALT 23 01/09/2024   AST 23 01/09/2024   ALKPHOS 69 01/09/2024   BILITOT 0.5 01/09/2024   Lab Results  Component Value Date   HGBA1C 6.5 01/09/2024   Lab Results  Component Value Date   VD25OH 25.12 (L) 01/09/2024   Routine general medical examination at a health care facility Assessment & Plan: We discussed the importance of regular physical activity and healthy  diet for prevention of chronic illness and/or complications. Preventive guidelines reviewed. Vaccination up to date. She is going to establish with new gynecologist. Next CPE in a year.   Hyperlipemia, mixed Assessment & Plan: Continue Rosuvastatin  10 mg daily and low fat diet. Further recommendations according to FLP results. Follow up in a year.  Orders: -     Comprehensive metabolic panel with GFR; Future -     Lipid panel; Future -     Rosuvastatin  Calcium ; Take 1 tablet (10 mg total) by mouth daily.  Dispense: 90 tablet; Refill: 3  Anxiety disorder, unspecified type Assessment & Plan: Currently on sertraline  50 mg 1/2 tablet daily. Following with psychiatrist and psychotherapist.   Vitamin D  deficiency, unspecified Assessment & Plan: Continue OTC vit D 2000 U daily. Further recommendations according to 25 OH vit D result.  Orders: -     VITAMIN D  25 Hydroxy (Vit-D Deficiency, Fractures); Future  Prediabetes Assessment & Plan: Consistency with a healthy lifestyle encouraged for diabetes prevention. Last hemoglobin A1c 6.2 in 09/2022. Further recommendation will be given according to hemoglobin A1c result.  Orders: -     Hemoglobin A1c; Future  Mild intermittent asthma without complication Assessment & Plan: Persistent nonproductive cough she reported today could be related to this problem. We discussed options, including adding Symbicort to use daily. She prefers to continue albuterol  inhaler 1 to 2 puff every 6 hours as needed and would like referral to immunologist.  Orders: -     Ambulatory referral to Immunology  Seasonal allergic rhinitis, unspecified trigger Assessment & Plan: Problem is not adequately controlled. Immunology referral placed as requested.  Orders: -     Ambulatory referral to Immunology  Earache on right  ? Eustachian tube dysfunction. Autoinflation maneuvers a few times through the day when symptomatic recommended.  Return in  about 1 year (around 01/08/2025).  Kathleena Freeman G. [name], MD  [facility]. [place] office.

## 2024-01-09 NOTE — Assessment & Plan Note (Signed)
 Consistency with a healthy lifestyle encouraged for diabetes prevention. Last hemoglobin A1c 6.2 in 09/2022. Further recommendation will be given according to hemoglobin A1c result.

## 2024-01-09 NOTE — Assessment & Plan Note (Signed)
 Currently on sertraline  50 mg 1/2 tablet daily. Following with psychiatrist and psychotherapist.

## 2024-01-09 NOTE — Assessment & Plan Note (Signed)
 Persistent nonproductive cough she reported today could be related to this problem. We discussed options, including adding Symbicort to use daily. She prefers to continue albuterol  inhaler 1 to 2 puff every 6 hours as needed and would like referral to immunologist.

## 2024-01-12 ENCOUNTER — Telehealth: Payer: Self-pay | Admitting: Adult Health

## 2024-01-12 MED ORDER — SERTRALINE HCL 25 MG PO TABS: 25.0000 mg | ORAL_TABLET | Freq: Every day | ORAL | 0 refills | Status: AC

## 2024-01-12 NOTE — Telephone Encounter (Signed)
Rx for 2.5mg sent

## 2024-01-12 NOTE — Telephone Encounter (Signed)
 Pt said she is taking 25mg  of Zoloft , just breaking the 50mg  in half and pharm cx her script. Pt is needing refill of 25mg  sent to    [facility] [address]  [facility] [place]

## 2024-01-21 ENCOUNTER — Ambulatory Visit (INDEPENDENT_AMBULATORY_CARE_PROVIDER_SITE_OTHER): Payer: BC Managed Care – PPO | Admitting: Psychology

## 2024-01-21 DIAGNOSIS — F3281 Premenstrual dysphoric disorder: Secondary | ICD-10-CM | POA: Diagnosis not present

## 2024-01-21 DIAGNOSIS — F411 Generalized anxiety disorder: Secondary | ICD-10-CM | POA: Diagnosis not present

## 2024-01-21 NOTE — Progress Notes (Signed)
 [facility] Counselor/Therapist Progress Note  Patient ID: Angela Harrell, MRN: [id number]   Date: 01/21/24  Time Spent: 10:04 am - 11:02  am : 58 Minutes  Treatment Type: Individual Therapy.  Reported Symptoms: depression and anxiety.   Mental Status Exam:  Appearance:  Casual     Behavior: Appropriate  Motor: Normal  Speech/Language:  Clear and Coherent and Normal Rate  Affect: Tearful  Mood: sad  Thought process: normal  Thought content:   WNL  Sensory/Perceptual disturbances:   WNL  Orientation: oriented to person, place, time/date, and situation  Attention: Good  Concentration: Good  Memory: WNL  Fund of knowledge:  Good  Insight:   Good  Judgment:  Good  Impulse Control: Good   Risk Assessment: Danger to Self:  No Self-injurious Behavior: No Danger to Others: No Duty to Warn:no Physical Aggression / Violence:No  Access to Firearms a concern: No  Gang Involvement:No   Subjective:   Angela Harrell participated from home, via video and consented to treatment. Therapist participated from home office. She discussed the events of the past week. Angela Harrell noted feeling like a nut case and attributed this to her cycle and the possibility of perimenopause. She noted feeling anxious, the sky is falling, difficulty stopping tearfulness. She noted her effort to be mindful of this, engage in self-care, and eating more healthfully. She noted her attempts to use the container tool to set boundaries for self and noted this being difficult to do. She noted her son failing two driving tests and noted that a 3rd failure would require that he takes the driver's ed course a second time. She noted her anxiety that this would result in a delay in a reduction in her responsibility to drive others and extends her output in various areas. We worked on processing this during the session. Angela Harrell endorsed rumination regarding this. She noted this being a reminder of a past similar  experience. We worked on differentiating between these stressors. We worked on reviewing coping skills, differentiating between feelings and data, grounding self, challenging negative thoughts and feelings, and focusing on areas of healthy control. She noted her effort to set boundaries with self regarding the current political atmosphere and current world events.  Which therapist praised. Angela Harrell was engaged and motivated during the session, expressed commitment towards goals. Therapist validated Angela Harrell's feelings and experience and provided supportive therapy. A follow-up was scheduled for continued treatment, which she benefits from.   Diagnosis:  Generalized anxiety disorder  PMDD (premenstrual dysphoric disorder)  Psychiatric Treatment: Yes , [name], NP.    Treatment Plan:  Client Abilities/Strengths Alynn is intelligent, self-aware, and motivated for change.   Support System: Family and friends.   Client Treatment Preferences Outpatient therapy and continued psychiatric treatment.   Client Statement of Needs Angela Harrell would like to engage in activities that she previously avoided due to anxiety, building and maintaining a daily routine, managing distress, managing overall symptoms, processing past events, building a larger support system, maintain sessions with psychiatric provider, being more assertive, verbalizing needs, and setting boundaries with others, managing social pressure (owing explanations or answering requests immediately).    Treatment Level Weekly  Symptoms  Depression: feeling down, poor sleep, lethargy, fluctuating appetite, feeling bad about self.   (Status: maintained) Anxiety:  Feeling anxious, difficulty managing worry, worrying about different things, trouble relaxing, irritability, and feeling afraid something awful might happen.   (Status: maintained)  Goals:   Angela Harrell experiences symptoms of depression and anxiety.   Treatment  plan signed and  available on s-drive:   No, pending signature page.  Amber Williard was sent the treatment plan signature form on 11/12/23.   Target Date: 11/11/24 Frequency: Weekly  Progress: 0 Modality: individual    Therapist will provide referrals for additional resources as appropriate.  Therapist will provide psycho-education regarding Elliot's diagnosis and corresponding treatment approaches and interventions. [name], [name] will support the patient's ability to achieve the goals identified. will employ CBT, BA, Problem-solving, Solution Focused, Mindfulness,  coping skills, & other evidenced-based practices will be used to promote progress towards healthy functioning to help manage decrease symptoms associated with her diagnosis.   Reduce overall level, frequency, and intensity of the feelings of depression, anxiety.  evidenced by decreased overall symptoms from 6 to 7 days/week to 0 to 1 days/week per client report for at least 3 consecutive months. Verbally express understanding of the relationship between feelings of depression, anxiety and their impact on thinking patterns and behaviors. Verbalize an understanding of the role that distorted thinking plays in creating fears, excessive worry, and ruminations.    [name] participated in the creation of the treatment plan)   [name], [name]

## 2024-02-02 NOTE — Progress Notes (Unsigned)
 New Patient Note  RE: Angela Harrell MRN: [id number] DOB: 1975-10-21 Date of Office Visit: 02/03/2024  Consult requested by: [name], [name], MD Primary care provider: [name], [name], MD  Chief Complaint: No chief complaint on file.  History of Present Illness: I had the pleasure of seeing Angela Harrell for initial evaluation at the [facility] on 02/03/2024. She is a 48 y.o. female, who is referred here by [name], [name], MD for the evaluation of ***.  Discussed the use of AI scribe software for clinical note transcription with the patient, who gave verbal consent to proceed.  History of Present Illness             ***  Assessment and Plan: Angela Harrell is a 47 y.o. female with: ***  Assessment and Plan               No follow-ups on file.  No orders of the defined types were placed in this encounter.  Lab Orders  No laboratory test(s) ordered today    Other allergy screening: Asthma: {Blank single:19197::yes,no} Rhino conjunctivitis: {Blank single:19197::yes,no} Food allergy: {Blank single:19197::yes,no} Medication allergy: {Blank single:19197::yes,no} Hymenoptera allergy: {Blank single:19197::yes,no} Urticaria: {Blank single:19197::yes,no} Eczema:{Blank single:19197::yes,no} History of recurrent infections suggestive of immunodeficency: {Blank single:19197::yes,no}  Diagnostics: Spirometry:  Tracings reviewed. Her effort: {Blank single:19197::Good reproducible efforts.,It was hard to get consistent efforts and there is a question as to whether this reflects a maximal maneuver.,Poor effort, data can not be interpreted.} FVC: ***L FEV1: ***L, ***% predicted FEV1/FVC ratio: ***% Interpretation: {Blank single:19197::Spirometry consistent with mild obstructive disease,Spirometry consistent with moderate obstructive disease,Spirometry consistent with severe obstructive disease,Spirometry consistent  with possible restrictive disease,Spirometry consistent with mixed obstructive and restrictive disease,Spirometry uninterpretable due to technique,Spirometry consistent with normal pattern,No overt abnormalities noted given today's efforts}.  Please see scanned spirometry results for details.  Skin Testing: {Blank single:19197::Select foods,Environmental allergy panel,Environmental allergy panel and select foods,Food allergy panel,None,Deferred due to recent antihistamines use}. *** Results discussed with patient/family.   Past Medical History: Patient Active Problem List   Diagnosis Date Noted  . Routine general medical examination at a health care facility 01/09/2024  . Prediabetes 10/05/2022  . Insomnia 01/15/2019  . Ganglion of left wrist 07/17/2018  . GERD (gastroesophageal reflux disease) 02/11/2017  . Hyperlipemia, mixed 05/07/2016  . Vitamin D  deficiency, unspecified 02/02/2016  . Breast cancer screening 11/09/2014  . Class 1 obesity with body mass index (BMI) of 33.0 to 33.9 in adult 11/09/2014  . Left-sided low back pain without sciatica 11/09/2014  . Tearfulness 11/09/2014  . Dyslipidemia (high LDL; low HDL) 05/31/2008  . Anxiety disorder, unspecified 05/31/2008  . Allergic rhinitis 05/31/2008  . Asthma, mild intermittent 05/31/2008  . Bilateral nephrolithiasis 05/31/2008   Past Medical History:  Diagnosis Date  . ALLERGIC RHINITIS 05/31/2008  . Anemia   . ANXIETY 05/31/2008  . ASTHMA 05/31/2008  . Asthma   . CHOLELITHIASIS 05/31/2008  . High blood pressure    readings with stones  . History of kidney stones   . History of right breast biopsy 2017  . HYPERLIPIDEMIA 05/31/2008  . Kidney stone   . NEPHROLITHIASIS, HX OF 05/31/2008   Past Surgical History: Past Surgical History:  Procedure Laterality Date  . CESAREAN SECTION  [phone]  . CHOLECYSTECTOMY  1999  . KIDNEY STONE SURGERY    . ORIF CONGENITAL HIP DISLOCATION    . poly  syst removal     1998   Medication List:  Current Outpatient Medications  Medication Sig Dispense Refill  . albuterol  (PROVENTIL  HFA) 108 (90 Base) MCG/ACT inhaler Inhale 2 puffs into the lungs every 6 (six) hours as needed for wheezing or shortness of breath. 1 each 0  . Cholecalciferol (VITAMIN D3 PO) Take 2,000 mg by mouth daily.     SABRA MAGNESIUM PO Take by mouth.    . Omega-3 Fatty Acids (FISH OIL) 1000 MG CAPS Take by mouth.    . Probiotic Product (PROBIOTIC PO) Take 1 tablet by mouth every other day.    . PSYLLIUM HUSK PO Take 2 capsules by mouth daily.    . rosuvastatin  (CRESTOR ) 10 MG tablet Take 1 tablet (10 mg total) by mouth daily. 90 tablet 3  . sertraline  (ZOLOFT ) 25 MG tablet Take 1 tablet (25 mg total) by mouth daily. 30 tablet 0  . Triamcinolone  Acetonide (TRIAMCINOLONE  0.1 % CREAM : EUCERIN) CREA Apply 1 application topically 2 (two) times daily as needed for rash or itching. 500 each 1  . Turmeric Curcumin 500 MG CAPS Take by mouth.     Current Facility-Administered Medications  Medication Dose Route Frequency Provider Last Rate Last Admin  . 0.9 %  sodium chloride  infusion  500 mL Intravenous Continuous Cirigliano, Vito V, DO       Allergies: Allergies  Allergen Reactions  . Beef-Derived Drug Products Diarrhea    Nausea and vomiting  . Prochlorperazine Edisylate Shortness Of Breath    Compazine Tongue swelling  . Prochlorperazine Edisylate Swelling    Swelling of throat  . Tree Extract Anaphylaxis    Throat itchy, roof of mouth gets raw  . Banana Itching and Nausea And Vomiting    Throat gets itchy  . Beef (Bovine) Protein     Diarrhea and stomach cramps   . Oxycodone-Acetaminophen      Vomiting  . Permethrin Rash    Rash and fever  . Statins Itching  . Morphine And Codeine Itching  . Other     Tree Nuts (can have peanuts and cashews) Pyrethrine (ingredient in lice products)- fever and rash  . Oxycodone-Acetaminophen  Nausea And Vomiting  . Latex Hives  and Rash    Rash   Social History: Social History   Socioeconomic History  . Marital status: Married    Spouse name: Not on file  . Number of children: 3  . Years of education: BS  . Highest education level: Bachelor's degree (e.g., BA, AB, BS)  Occupational History  . Occupation: Metallurgist  . Occupation: Substitute  Tobacco Use  . Smoking status: Never  . Smokeless tobacco: Never  Substance and Sexual Activity  . Alcohol use: No  . Drug use: No  . Sexual activity: Yes    Birth control/protection: Surgical  Other Topics Concern  . Not on file  Social History Narrative   Lives with husband & 3 kids, work at home, PT outside of home. Often volunteers.   Social Drivers of Corporate investment banker Strain: Low Risk  (01/07/2024)   Overall Financial Resource Strain (CARDIA)   . Difficulty of Paying Living Expenses: Not very hard  Food Insecurity: No Food Insecurity (01/07/2024)   Hunger Vital Sign   . Worried About Programme researcher, broadcasting/film/video in the Last Year: Never true   . Ran Out of Food in the Last Year: Never true  Transportation Needs: No Transportation Needs (01/07/2024)   PRAPARE - Transportation   . Lack of Transportation (Medical): No   . Lack of Transportation (Non-Medical): No  Physical Activity:  Unknown (01/07/2024)   Exercise Vital Sign   . Days of Exercise per Week: 5 days   . Minutes of Exercise per Session: Patient declined  Stress: Patient Declined (01/07/2024)   [employer] of Occupational Health - Occupational Stress Questionnaire   . Feeling of Stress: Patient declined  Social Connections: Socially Integrated (01/07/2024)   Social Connection and Isolation Panel   . Frequency of Communication with Friends and Family: More than three times a week   . Frequency of Social Gatherings with Friends and Family: Once a week   . Attends Religious Services: More than 4 times per year   . Active Member of Clubs or Organizations: Yes   . Attends Banker  Meetings: More than 4 times per year   . Marital Status: Married   Lives in a ***. Smoking: *** Occupation: ***  Environmental HistorySurveyor, minerals in the house: Network engineer in the family room: {Blank single:19197::yes,no} Carpet in the bedroom: {Blank single:19197::yes,no} Heating: {Blank single:19197::electric,gas,heat pump} Cooling: {Blank single:19197::central,window,heat pump} Pet: {Blank single:19197::yes ***,no}  Family History: Family History  Problem Relation Age of Onset  . Breast cancer Mother 61  . Arthritis Mother   . Hyperlipidemia Father        triglycerides  . Mental illness Brother        bipolar  . Asthma Brother   . Cancer Maternal Uncle        mult myeloma  . Vascular Disease Maternal Uncle   . Heart disease Paternal Grandfather   . Allergic Disorder Daughter   . Colon cancer Neg Hx   . Colon polyps Neg Hx   . Esophageal cancer Neg Hx   . Stomach cancer Neg Hx   . Rectal cancer Neg Hx    Problem                               Relation Asthma                                   *** Eczema                                *** Food allergy                          *** Allergic rhino conjunctivitis     ***  Review of Systems  Constitutional:  Negative for appetite change, chills, fever and unexpected weight change.  HENT:  Negative for congestion and rhinorrhea.   Eyes:  Negative for itching.  Respiratory:  Negative for cough, chest tightness, shortness of breath and wheezing.   Cardiovascular:  Negative for chest pain.  Gastrointestinal:  Negative for abdominal pain.  Genitourinary:  Negative for difficulty urinating.  Skin:  Negative for rash.  Neurological:  Negative for headaches.    Objective: LMP 12/27/2023 (Exact Date)  There is no height or weight on file to calculate BMI. Physical Exam Vitals and nursing note reviewed.  Constitutional:      Appearance: Normal appearance. She is  well-developed.  HENT:     Head: Normocephalic and atraumatic.     Right Ear: Tympanic membrane and external ear normal.     Left Ear: Tympanic membrane and external ear normal.  Nose: Nose normal.     Mouth/Throat:     Mouth: Mucous membranes are moist.     Pharynx: Oropharynx is clear.  Eyes:     Conjunctiva/sclera: Conjunctivae normal.  Cardiovascular:     Rate and Rhythm: Normal rate and regular rhythm.     Heart sounds: Normal heart sounds. No murmur heard.    No friction rub. No gallop.  Pulmonary:     Effort: Pulmonary effort is normal.     Breath sounds: Normal breath sounds. No wheezing, rhonchi or rales.  Musculoskeletal:     Cervical back: Neck supple.  Skin:    General: Skin is warm.     Findings: No rash.  Neurological:     Mental Status: She is alert and oriented to person, place, and time.  Psychiatric:        Behavior: Behavior normal.   The plan was reviewed with the patient/family, and all questions/concerned were addressed.  It was my pleasure to see Angela Harrell today and participate in her care. Please feel free to contact me with any questions or concerns.  Sincerely,  Orlan Cramp, DO Allergy & Immunology  [facility]  [place] office: [phone] [facility] office: [phone]

## 2024-02-03 ENCOUNTER — Encounter: Payer: Self-pay | Admitting: Allergy

## 2024-02-03 ENCOUNTER — Ambulatory Visit: Payer: Self-pay | Admitting: Allergy

## 2024-02-03 ENCOUNTER — Other Ambulatory Visit: Payer: Self-pay

## 2024-02-03 VITALS — BP 126/84 | HR 77 | Temp 98.5°F | Resp 16 | Ht 63.0 in | Wt 205.4 lb

## 2024-02-03 DIAGNOSIS — H1013 Acute atopic conjunctivitis, bilateral: Secondary | ICD-10-CM

## 2024-02-03 DIAGNOSIS — T781XXA Other adverse food reactions, not elsewhere classified, initial encounter: Secondary | ICD-10-CM

## 2024-02-03 DIAGNOSIS — J4520 Mild intermittent asthma, uncomplicated: Secondary | ICD-10-CM

## 2024-02-03 DIAGNOSIS — T781XXD Other adverse food reactions, not elsewhere classified, subsequent encounter: Secondary | ICD-10-CM

## 2024-02-03 DIAGNOSIS — Z889 Allergy status to unspecified drugs, medicaments and biological substances status: Secondary | ICD-10-CM

## 2024-02-03 DIAGNOSIS — J3089 Other allergic rhinitis: Secondary | ICD-10-CM

## 2024-02-03 DIAGNOSIS — R12 Heartburn: Secondary | ICD-10-CM

## 2024-02-03 DIAGNOSIS — R053 Chronic cough: Secondary | ICD-10-CM

## 2024-02-03 MED ORDER — RYALTRIS 665-25 MCG/ACT NA SUSP: 1.0000 | Freq: Two times a day (BID) | NASAL | 5 refills | Status: AC

## 2024-02-03 MED ORDER — EPINEPHRINE 0.3 MG/0.3ML IJ SOAJ: 0.3000 mg | INTRAMUSCULAR | 1 refills | Status: AC | PRN

## 2024-02-03 NOTE — Patient Instructions (Addendum)
 Rhinitis  Return for allergy skin testing. Will make additional recommendations based on results. If significant positives will recommend allergy injections - handout given. Make sure you don't take any antihistamines for 3 days before the skin testing appointment. Don't put any lotion on the back and arms on the day of testing.  Must be in good health and not ill. No vaccines/injections/antibiotics within the past 7 days.  Plan on being here for 30-60 minutes.  Stop 3 days before skin testing: Use over the counter antihistamines such as Zyrtec (cetirizine), Claritin (loratadine), Allegra (fexofenadine), or Xyzal (levocetirizine) daily as needed. May take twice a day during allergy flares. May switch antihistamines every few months. Start Ryaltris  (olopatadine + mometasone nasal spray combination) 1-2 sprays per nostril twice a day.  This replaces your other nasal sprays. If this works well for you, then have pharmacy ship the medication to your home - prescription already sent in.  Nasal saline spray (i.e., Simply Saline) or nasal saline lavage (i.e., NeilMed) is recommended as needed and prior to medicated nasal sprays.  Foods Continue to avoid foods that are bothersome - bananas, beef, tree nuts, melons, kiwi.  I have prescribed epinephrine  injectable device and demonstrated proper use. For mild symptoms you can take over the counter antihistamines (zyrtec 10mg  to 20mg ) and monitor symptoms closely.  If symptoms worsen or if you have severe symptoms including breathing issues, throat closure, significant swelling, whole body hives, severe diarrhea and vomiting, lightheadedness then use epinephrine  and seek immediate medical care afterwards. Emergency action plan given.  Discussed that her food triggered oral and throat symptoms to some of the fresh fruits are likely caused by oral food allergy syndrome (OFAS). This is caused by cross reactivity of pollen with fresh fruits and vegetables, and  nuts. Symptoms are usually localized in the form of itching and burning in mouth and throat. Very rarely it can progress to more severe symptoms. Eating foods in cooked or processed forms usually minimizes symptoms. I recommended avoidance of eating the problem foods, especially during the peak season(s). Sometimes, OFAS can induce severe throat swelling or even a systemic reaction; with such instance, I advised them to report to a local ER. A list of common pollens and food cross-reactivities was provided to the patient.   Breathing Normal breathing test today with some benefit after the breathing treatment.  May use albuterol  rescue inhaler 2 puffs every 4 to 6 hours as needed for shortness of breath, chest tightness, coughing, and wheezing. May use albuterol  rescue inhaler 2 puffs 5 to 15 minutes prior to strenuous physical activities. Monitor frequency of use - if you need to use it more than twice per week on a consistent basis let us  know.   Drug allergies Continue to avoid drugs on the drug allergy list.   Skin  Keep track of rashes and take pictures. See below for proper skin care. Use fragrance free and dye free products. No dryer sheets or fabric softener.    Heartburn See handout for lifestyle and dietary modifications.  Follow up for skin testing.    Skin care recommendations  Bath time: Always use lukewarm water. AVOID very hot or cold water. Keep bathing time to 5-10 minutes. Do NOT use bubble bath. Use a mild soap and use just enough to wash the dirty areas. Do NOT scrub skin vigorously.  After bathing, pat dry your skin with a towel. Do NOT rub or scrub the skin.  Moisturizers and prescriptions:  ALWAYS apply moisturizers  immediately after bathing (within 3 minutes). This helps to lock-in moisture. Use the moisturizer several times a day over the whole body. Good summer moisturizers include: Aveeno, CeraVe, Cetaphil. Good winter moisturizers include: Aquaphor,  Vaseline, Cerave, Cetaphil, Eucerin, Vanicream. When using moisturizers along with medications, the moisturizer should be applied about one hour after applying the medication to prevent diluting effect of the medication or moisturize around where you applied the medications. When not using medications, the moisturizer can be continued twice daily as maintenance.  Laundry and clothing: Avoid laundry products with added color or perfumes. Use unscented hypo-allergenic laundry products such as Tide free, Cheer free & gentle, and All free and clear.  If the skin still seems dry or sensitive, you can try double-rinsing the clothes. Avoid tight or scratchy clothing such as wool. Do not use fabric softeners or dyer sheets.

## 2024-02-04 ENCOUNTER — Ambulatory Visit: Payer: BC Managed Care – PPO | Admitting: Psychology

## 2024-02-04 DIAGNOSIS — F3281 Premenstrual dysphoric disorder: Secondary | ICD-10-CM | POA: Diagnosis not present

## 2024-02-04 DIAGNOSIS — F411 Generalized anxiety disorder: Secondary | ICD-10-CM

## 2024-02-04 NOTE — Progress Notes (Signed)
 [facility] Counselor/Therapist Progress Note  Patient ID: Elany Felix, MRN: [id number]   Date: 02/04/24  Time Spent: 10:07 am - 10:55 am : 47 Minutes  Treatment Type: Individual Therapy.  Reported Symptoms: depression and anxiety.   Mental Status Exam:  Appearance:  Casual     Behavior: Appropriate  Motor: Normal  Speech/Language:  Clear and Coherent and Normal Rate  Affect: Tearful  Mood: sad  Thought process: normal  Thought content:   WNL  Sensory/Perceptual disturbances:   WNL  Orientation: oriented to person, place, time/date, and situation  Attention: Good  Concentration: Good  Memory: WNL  Fund of knowledge:  Good  Insight:   Good  Judgment:  Good  Impulse Control: Good   Risk Assessment: Danger to Self:  No Self-injurious Behavior: No Danger to Others: No Duty to Warn:no Physical Aggression / Violence:No  Access to Firearms a concern: No  Gang Involvement:No   Subjective:   [name] participated from home, via video and consented to treatment. Therapist participated from home office. She discussed the events of the past week. 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 She noted her and her husband's effort to speak to one another more explicitly and not making assumptions about the others' mood. She noted her effort to eat more healthfully and increase her exercise. She noted losing some weight as a result. She noted her efforts to eat healthier snacks while being mindful of the calories. She noted some improvement in her stress level due to her son being able to drive himself. She noted being able to work on tasks and goals without consistent interruptions from others to transport. She noted her work to challenge her negative self-talk and negative thoughts about self in regards to social relationships. She noted using evidence to challenge these thoughts and provided examples of this.  Therapist praised Raizel for her effort in this area. Therapist encouraged Mccartney to continue working in this area. Therapist validated Loralie's feelings and experience during the session. Therapist provided supportive therapy. A follow-up was scheduled for continued treatment, which she benefits from.   Diagnosis:  Generalized anxiety disorder  PMDD (premenstrual dysphoric disorder)  Psychiatric Treatment: Yes , [name], NP.    Treatment Plan:  Client Abilities/Strengths Takako is intelligent, self-aware, and motivated for change.   Support System: Family and friends.   Client Treatment Preferences Outpatient therapy and continued psychiatric treatment.   Client Statement of Needs Kendallyn would like to engage in activities that she previously avoided due to anxiety, building and maintaining a daily routine, managing distress, managing overall symptoms, processing past events, building a larger support system, maintain sessions with psychiatric provider, being more assertive, verbalizing needs, and setting boundaries with others, managing social pressure (owing explanations or answering requests immediately).    Treatment Level Weekly  Symptoms  Depression: feeling down, poor sleep, lethargy, fluctuating appetite, feeling bad about self.   (Status: maintained) Anxiety:  Feeling anxious, difficulty managing worry, worrying about different things, trouble relaxing, irritability, and feeling afraid something awful might happen.   (Status: maintained)  Goals:   Aleeta experiences symptoms of depression and anxiety.   Treatment plan signed and available on s-drive:   No, pending signature page.  Kellina Dreese was sent the treatment plan signature form on 11/12/23.   Target Date: 11/11/24 Frequency: Weekly  Progress: 0 Modality: individual    Therapist will provide referrals for additional resources as appropriate.  Therapist  will provide psycho-education regarding Jolly's  diagnosis and corresponding treatment approaches and interventions. [name], [name] will support the patient's ability to achieve the goals identified. will employ CBT, BA, Problem-solving, Solution Focused, Mindfulness,  coping skills, & other evidenced-based practices will be used to promote progress towards healthy functioning to help manage decrease symptoms associated with her diagnosis.   Reduce overall level, frequency, and intensity of the feelings of depression, anxiety.  evidenced by decreased overall symptoms from 6 to 7 days/week to 0 to 1 days/week per client report for at least 3 consecutive months. Verbally express understanding of the relationship between feelings of depression, anxiety and their impact on thinking patterns and behaviors. Verbalize an understanding of the role that distorted thinking plays in creating fears, excessive worry, and ruminations.    [name] participated in the creation of the treatment plan)   [name], [name]

## 2024-02-17 ENCOUNTER — Encounter: Payer: Self-pay | Admitting: Allergy

## 2024-02-17 ENCOUNTER — Ambulatory Visit: Admitting: Allergy

## 2024-02-17 DIAGNOSIS — T781XXD Other adverse food reactions, not elsewhere classified, subsequent encounter: Secondary | ICD-10-CM | POA: Diagnosis not present

## 2024-02-17 DIAGNOSIS — H1013 Acute atopic conjunctivitis, bilateral: Secondary | ICD-10-CM

## 2024-02-17 DIAGNOSIS — J3089 Other allergic rhinitis: Secondary | ICD-10-CM | POA: Diagnosis not present

## 2024-02-17 DIAGNOSIS — R053 Chronic cough: Secondary | ICD-10-CM

## 2024-02-17 DIAGNOSIS — R12 Heartburn: Secondary | ICD-10-CM

## 2024-02-17 DIAGNOSIS — Z889 Allergy status to unspecified drugs, medicaments and biological substances status: Secondary | ICD-10-CM

## 2024-02-17 DIAGNOSIS — J4520 Mild intermittent asthma, uncomplicated: Secondary | ICD-10-CM

## 2024-02-17 NOTE — Patient Instructions (Addendum)
 Today's skin testing was inconclusive as you had borderline positive to histamine. Negative to indoor/outdoor allergens and select foods.   Results given.  Rhinitis  Skin testing inconclusive. Get bloodwork If significant positives will recommend allergy  injections.  We are ordering labs, so please allow 1-2 weeks for the results to come back. With the newly implemented Cures Act, the labs might be visible to you at the same time that they become visible to me. However, I will not address the results until all of the results are back, so please be patient.  In the meantime, continue recommendations in your patient instructions, including avoidance measures (if applicable), until you hear from me. Use over the counter antihistamines such as Zyrtec (cetirizine), Claritin (loratadine), or Xyzal (levocetirizine) daily as needed. May take twice a day during allergy  flares. May switch antihistamines every few months. Start Ryaltris  (olopatadine + mometasone nasal spray combination) 1-2 sprays per nostril twice a day.  Nasal saline spray (i.e., Simply Saline) or nasal saline lavage (i.e., NeilMed) is recommended as needed and prior to medicated nasal sprays.  Foods Continue to avoid foods that are bothersome - bananas, beef, tree nuts, melons, kiwi.  Get bloodwork.  For mild symptoms you can take over the counter antihistamines (zyrtec 10mg  to 20mg ) and monitor symptoms closely.  If symptoms worsen or if you have severe symptoms including breathing issues, throat closure, significant swelling, whole body hives, severe diarrhea and vomiting, lightheadedness then use epinephrine  and seek immediate medical care afterwards. Emergency action plan in place.   Discussed that her food triggered oral and throat symptoms to some of the fresh fruits are likely caused by oral food allergy  syndrome (OFAS). This is caused by cross reactivity of pollen with fresh fruits and vegetables, and nuts. Symptoms are  usually localized in the form of itching and burning in mouth and throat. Very rarely it can progress to more severe symptoms. Eating foods in cooked or processed forms usually minimizes symptoms. I recommended avoidance of eating the problem foods, especially during the peak season(s). Sometimes, OFAS can induce severe throat swelling or even a systemic reaction; with such instance, I advised them to report to a local ER.  Breathing May use albuterol  rescue inhaler 2 puffs every 4 to 6 hours as needed for shortness of breath, chest tightness, coughing, and wheezing. May use albuterol  rescue inhaler 2 puffs 5 to 15 minutes prior to strenuous physical activities. Monitor frequency of use - if you need to use it more than twice per week on a consistent basis let us  know.   Drug allergies Continue to avoid drugs on the drug allergy  list.   Skin  Keep track of rashes and take pictures. See below for proper skin care. Use fragrance free and dye free products. No dryer sheets or fabric softener.    Heartburn Continue lifestyle and dietary modifications.  Follow up in 3 months or sooner if needed.   Skin care recommendations  Bath time: Always use lukewarm water. AVOID very hot or cold water. Keep bathing time to 5-10 minutes. Do NOT use bubble bath. Use a mild soap and use just enough to wash the dirty areas. Do NOT scrub skin vigorously.  After bathing, pat dry your skin with a towel. Do NOT rub or scrub the skin.  Moisturizers and prescriptions:  ALWAYS apply moisturizers immediately after bathing (within 3 minutes). This helps to lock-in moisture. Use the moisturizer several times a day over the whole body. Good summer moisturizers include: Aveeno, CeraVe,  Cetaphil. Good winter moisturizers include: Aquaphor, Vaseline, Cerave, Cetaphil, Eucerin, Vanicream. When using moisturizers along with medications, the moisturizer should be applied about one hour after applying the medication to  prevent diluting effect of the medication or moisturize around where you applied the medications. When not using medications, the moisturizer can be continued twice daily as maintenance.  Laundry and clothing: Avoid laundry products with added color or perfumes. Use unscented hypo-allergenic laundry products such as Tide free, Cheer free & gentle, and All free and clear.  If the skin still seems dry or sensitive, you can try double-rinsing the clothes. Avoid tight or scratchy clothing such as wool. Do not use fabric softeners or dyer sheets.

## 2024-02-17 NOTE — Progress Notes (Signed)
 Skin testing note  RE: Angela Harrell MRN: [id number] DOB: 04-15-1976 Date of Office Visit: 02/17/2024  Referring provider: [name], [name], MD Primary care provider: [name], [name], MD  Chief Complaint: skin testing  History of Present Illness: I had the pleasure of seeing Angela Harrell for a skin testing visit at the [facility] on 02/17/2024. She is a 48 y.o. female, who is being followed for allergic rhinoconjunctivitis, adverse food reaction, cough, reactive airway disease, heartburn and multiple drug allergies. Her previous allergy  office visit was on 02/03/2024 with Dr. Luke. Today is a skin testing visit.   Discussed the use of AI scribe software for clinical note transcription with the patient, who gave verbal consent to proceed.    She experiences oral allergy  syndrome with symptoms of oral discomfort upon consumption of certain foods, including tree nuts, bananas, beef, melons (specifically cantaloupe and honeydew), and kiwi. She has avoided some of these foods for a long time due to her symptoms.  Since discontinuing her allergy  medications, she has experienced worsening symptoms, including puffy eyes and an itchy face. She is currently taking generic Claritin, as Zyrtec lost its effectiveness and Allegra caused significant drowsiness. She has not yet tried Ryaltris  nasal spray, which she received recently.  She has a history of skin reactions to band-aids and poison ivy, with severe blistering from poison ivy exposure.   She uses an Epipen  for emergencies and has learned to wear a mask when mowing the lawn to prevent allergy  symptoms.     Assessment and Plan: [name] is a 48 y.o. female with: Other allergic rhinitis Allergic conjunctivitis of both eyes Past history - Chronic allergic rhinitis exacerbated seasonally, symptoms include itchy eyes, nasal congestion, and postnasal drip. Current antihistamines insufficient. Disliked Flonase due to floral scent.   Today's skin testing was inconclusive as you had borderline positive to histamine x 2.  Get bloodwork If significant positives will recommend allergy  injections.  Use over the counter antihistamines such as Zyrtec (cetirizine), Claritin (loratadine), or Xyzal (levocetirizine) daily as needed. May take twice a day during allergy  flares. May switch antihistamines every few months. Start Ryaltris  (olopatadine + mometasone nasal spray combination) 1-2 sprays per nostril twice a day.  Nasal saline spray (i.e., Simply Saline) or nasal saline lavage (i.e., NeilMed) is recommended as needed and prior to medicated nasal sprays.   Pollen-food allergy  Other adverse food reactions, not elsewhere classified Past history - Worsening sensitivities to bananas, beef, tree nuts, cantaloupe, honeydew melon, kiwi. Tolerates cashews.  Continue to avoid foods that are bothersome - bananas, beef, tree nuts, melons, kiwi.  Get bloodwork.  For mild symptoms you can take over the counter antihistamines (zyrtec 10mg  to 20mg ) and monitor symptoms closely.  If symptoms worsen or if you have severe symptoms including breathing issues, throat closure, significant swelling, whole body hives, severe diarrhea and vomiting, lightheadedness then use epinephrine  and seek immediate medical care afterwards. Emergency action plan in place.  Discussed that her food triggered oral and throat symptoms to some of the fresh fruits are likely caused by oral food allergy  syndrome (OFAS). This is caused by cross reactivity of pollen with fresh fruits and vegetables, and nuts. Symptoms are usually localized in the form of itching and burning in mouth and throat. Very rarely it can progress to more severe symptoms. Eating foods in cooked or processed forms usually minimizes symptoms. I recommended avoidance of eating the problem foods, especially during the peak season(s). Sometimes, OFAS can induce  severe throat swelling or even a systemic  reaction; with such instance, I advised them to report to a local ER.   Chronic cough Past history - Chronic cough x 1 year. No recent CXR. Differential includes asthma, reflux, PND or post-illness cough. 2025 spirometry today with some improvement in FEV1 post bronchodilator treatment. Clinically feeling slightly improved.  Will start with nasal spray first. If no benefit consider getting CXR and doing  2 month trial of daily inhaler.    Mild intermittent reactive airway disease Past history -  exercise induced asthma a a child. 2025 spirometry today with some improvement in FEV1 post bronchodilator treatment. Clinically feeling slightly improved.  May use albuterol  rescue inhaler 2 puffs every 4 to 6 hours as needed for shortness of breath, chest tightness, coughing, and wheezing. May use albuterol  rescue inhaler 2 puffs 5 to 15 minutes prior to strenuous physical activities. Monitor frequency of use - if you need to use it more than twice per week on a consistent basis let us  know.    Heartburn Continue lifestyle and dietary modifications.   Multiple drug allergies Continue to avoid drugs on the drug allergy  list.   Return in about 3 months (around 05/19/2024).  No orders of the defined types were placed in this encounter.  Lab Orders         Allergens w/Total IgE Area 2         Allergen, Banana f92         IgE Nut Prof. w/Component Rflx         Allergen, Kiwi Fruit, f84         Cantaloupe IgE         Alpha-Gal Panel      Diagnostics: Skin Testing: Environmental allergy  panel and select foods. Today's skin testing was inconclusive as you had borderline positive to histamine x 2.  Negative to indoor/outdoor allergens and select foods.  Results discussed with patient/family.  Airborne Adult Perc - 02/17/24 0937     Time Antigen Placed 9062    Allergen Manufacturer Jestine    Location Back    Number of Test 55    1. Control-Buffer 50% Glycerol Negative    2. Control-Histamine  --   +/-   3. Bahia Negative    4. French Southern Territ[place] Negative    5. Johnson Negative    6. [place]  Blue Negative    7. Meadow Fescue Negative    8. Perennial Rye Negative    9. Timothy Negative    10. Ragweed Mix Negative    11. Cocklebur Negative    12. Plantain,  English Negative    13. Baccharis Negative    14. Dog Fennel Negative    15. Russian Thistle Negative    16. Lamb's Quarters Negative    17. Sheep Sorrell Negative    18. Rough Pigweed Negative    19. Marsh Elder, Rough Negative    20. Mugwort, Common Negative    21. Box, Elder Negative    22. Cedar, red Negative    23. Sweet Gum Negative    24. Pecan Pollen Negative    25. Pine Mix Negative    26. Walnut, Black Pollen Negative    27. Red Mulberry Negative    28. Ash Mix Negative    29. Birch Mix Negative    30. Beech American Negative    31. Cottonwood, Guinea[place]issau Negative    32. Hickory, White Negative    33. Maple Mix Negative  34. Oak, Eastern Mix Negative    35. Sycamore Eastern Negative    36. Alternaria Alternata Negative    37. Cladosporium Herbarum Negative    38. Aspergillus Mix Negative    39. Penicillium Mix Negative    40. Bipolaris Sorokiniana (Helminthosporium) Negative    41. Drechslera Spicifera (Curvularia) Negative    42. Mucor Plumbeus Negative    43. Fusarium Moniliforme Negative    44. Aureobasidium Pullulans (pullulara) Negative    45. Rhizopus Oryzae Negative    46. Botrytis Cinera Negative    47. Epicoccum Nigrum Negative    48. Phoma Betae Negative    49. Dust Mite Mix Negative    50. Cat Hair 10,000 BAU/ml Negative    51.  Dog Epithelia Negative    52. Mixed Feathers Negative    53. Horse Epithelia Negative    54. Cockroach, German Negative    55. Tobacco Leaf Negative          Food Adult Perc - 02/17/24 0900     Time Antigen Placed 9062    Allergen Manufacturer Jestine    Location Back     Control-buffer 50% Glycerol Negative    Control-Histamine --   +/-   10. Cashew  Negative    11. Walnut Food Negative    12. Almond Negative    13. Hazelnut Negative    14. Pecan Food Negative    15. Pistachio Negative    16. [place] Nut Negative    36. Beef Negative    57. Banana Negative    63. Cantaloupe Negative    64. Watermelon Negative          Previous notes and tests were reviewed. The plan was reviewed with the patient/family, and all questions/concerned were addressed.  It was my pleasure to see Shakaria today and participate in her care. Please feel free to contact me with any questions or concerns.  Sincerely,  Orlan Cramp, DO Allergy  & Immunology  [facility]  [place] office: [phone] [facility] office: [phone]

## 2024-02-18 ENCOUNTER — Ambulatory Visit: Payer: BC Managed Care – PPO | Admitting: Psychology

## 2024-03-03 ENCOUNTER — Ambulatory Visit: Payer: BC Managed Care – PPO | Admitting: Psychology

## 2024-03-03 DIAGNOSIS — F3281 Premenstrual dysphoric disorder: Secondary | ICD-10-CM | POA: Diagnosis not present

## 2024-03-03 DIAGNOSIS — F411 Generalized anxiety disorder: Secondary | ICD-10-CM

## 2024-03-03 NOTE — Progress Notes (Signed)
  [facility]al Health Counselor/Therapist Progress Note  Patient ID: Angela Harrell, MRN: [id number]   Date: 03/03/24  Time Spent: 10:05 am - 11:03 am : 58 Minutes  Treatment Type: Individual Therapy.  Reported Symptoms: depression and anxiety.   Mental Status Exam:  Appearance:  Casual     Behavior: Appropriate  Motor: Normal  Speech/Language:  Clear and Coherent and Normal Rate  Affect: Tearful  Mood: sad  Thought process: normal  Thought content:   WNL  Sensory/Perceptual disturbances:   WNL  Orientation: oriented to person, place, time/date, and situation  Attention: Good  Concentration: Good  Memory: WNL  Fund of knowledge:  Good  Insight:   Good  Judgment:  Good  Impulse Control: Good   Risk Assessment: Danger to Self:  No Self-injurious Behavior: No Danger to Others: No Duty to Warn:no Physical Aggression / Violence:No  Access to Firearms a concern: No  Gang Involvement:No   Subjective:   Angela Harrell participated from home, via video and consented to treatment. Therapist participated from home office. She discussed the events of the past week. She noted some positives with having less stress in the mornings due to her son's ability to drive. She noted excitement regarding her eldest daughter's transition towards a job after her graduation. She noted her need for family to begin taking a more active role in cleaning the home and noted the barriers to this. She noted communicating her needs with others and highlighting her husband's incongruent communication and actions regarding this issue. She noted an update with her husband's cardiological issues and noted that he has ben to feal better. She noted the balance of having him rest while requesting additional assistance at home. She noted familial stressors, primarily with her in-laws, and noted generally keeping her distance. She provided an in-depth history of the family dynamics. She noted her effort to be  supportive of her husband during this time while maintaining her distance. She noted stressors related to her own family including her father's worsening dementia and noted him being more irritable and short. She noted his niceness is directly correlated with his stress levels. She noted this being the return of her childhood dad. She noted this being very sad at this point. She noted that he is behaving similarly to how he behaved during childhood and noted a lack of internal control over his own behavior towards others. She noted feelings of guilt regarding not spending time as much time with her father and grandmother but noted but noted the effect on her mood when she spends time with them. She noted wondering if this is her attempt to safeguard her mood or if she is just avoiding them. She noted her friend's advice regarding the need to maintain a relationship with her father. Angela Harrell was tearful during the session and noted how spending time with her father often takes away from her time with her own family. She described her father being really mean and implusive and noted that he only behave when outside people where there (friends). We will continue to explore and process this going forward. Angela Harrell noted having lost a total of 13# since beginning her exercise and dietary changes. Therapist praised Anesia for her effort in managing her health. Therapist validated Tashe's feelings and experience, during the session, and provided supportive therapy. A Follow-up was scheduled for continued treatment, which Angela Harrell benefits from.    Diagnosis:  Generalized anxiety disorder  PMDD (premenstrual dysphoric disorder)  Psychiatric Treatment: Yes , [name],  NP.    Treatment Plan:  Client Abilities/Strengths Loan is intelligent, self-aware, and motivated for change.   Support System: Family and friends.   Client Treatment Preferences Outpatient therapy and continued psychiatric  treatment.   Client Statement of Needs Angela Harrell would like to engage in activities that she previously avoided due to anxiety, building and maintaining a daily routine, managing distress, managing overall symptoms, processing past events, building a larger support system, maintain sessions with psychiatric provider, being more assertive, verbalizing needs, and setting boundaries with others, managing social pressure (owing explanations or answering requests immediately).    Treatment Level Weekly  Symptoms  Depression: feeling down, poor sleep, lethargy, fluctuating appetite, feeling bad about self.   (Status: maintained) Anxiety:  Feeling anxious, difficulty managing worry, worrying about different things, trouble relaxing, irritability, and feeling afraid something awful might happen.   (Status: maintained)  Goals:   Angela Harrell experiences symptoms of depression and anxiety.   Treatment plan signed and available on s-drive:   No, pending signature page.  Angela Harrell was sent the treatment plan signature form on 11/12/23.   Target Date: 11/11/24 Frequency: Weekly  Progress: 0 Modality: individual    Therapist will provide referrals for additional resources as appropriate.  Therapist will provide psycho-education regarding Angela Harrell's diagnosis and corresponding treatment approaches and interventions. Angela Mullet, [name] will support the patient's ability to achieve the goals identified. will employ CBT, BA, Problem-solving, Solution Focused, Mindfulness,  coping skills, & other evidenced-based practices will be used to promote progress towards healthy functioning to help manage decrease symptoms associated with her diagnosis.   Reduce overall level, frequency, and intensity of the feelings of depression, anxiety.  evidenced by decreased overall symptoms from 6 to 7 days/week to 0 to 1 days/week per client report for at least 3 consecutive months. Verbally express understanding of the relationship  between feelings of depression, anxiety and their impact on thinking patterns and behaviors. Verbalize an understanding of the role that distorted thinking plays in creating fears, excessive worry, and ruminations.    [name] participated in the creation of the treatment plan)   Angela Mullet, [name]

## 2024-03-04 DIAGNOSIS — J3089 Other allergic rhinitis: Secondary | ICD-10-CM | POA: Diagnosis not present

## 2024-03-04 DIAGNOSIS — T781XXD Other adverse food reactions, not elsewhere classified, subsequent encounter: Secondary | ICD-10-CM | POA: Diagnosis not present

## 2024-03-08 ENCOUNTER — Ambulatory Visit: Payer: Self-pay | Admitting: Allergy

## 2024-03-08 LAB — ALLERGENS W/TOTAL IGE AREA 2
Alternaria Alternata IgE: 0.67 kU/L — AB
Aspergillus Fumigatus IgE: <0.1 kU/L
Bermuda Grass IgE: <0.1 kU/L
Cat Dander IgE: <0.1 kU/L
Cedar, Mountain IgE: <0.1 kU/L
Cladosporium Herbarum IgE: <0.1 kU/L
Cockroach, German IgE: <0.1 kU/L
Common Silver Birch IgE: <0.1 kU/L
Cottonwood IgE: <0.1 kU/L
D Farinae IgE: <0.1 kU/L
D Pteronyssinus IgE: <0.1 kU/L
Dog Dander IgE: <0.1 kU/L
Elm, American IgE: <0.1 kU/L
Johnson Grass IgE: <0.1 kU/L
Maple/Box Elder IgE: <0.1 kU/L
Mouse Urine IgE: <0.1 kU/L
Oak, White IgE: <0.1 kU/L
Pecan, Hickory IgE: <0.1 kU/L
Penicillium Chrysogen IgE: <0.1 kU/L
Pigweed, Rough IgE: <0.1 kU/L
Ragweed, Short IgE: <0.1 kU/L
Sheep Sorrel IgE Qn: <0.1 kU/L
Timothy Grass IgE: <0.1 kU/L
White Mulberry IgE: <0.1 kU/L

## 2024-03-08 LAB — IGE NUT PROF. W/COMPONENT RFLX
F017-IgE Hazelnut (Filbert): <0.1 kU/L
F018-IgE Brazil Nut: <0.1 kU/L
F020-IgE Almond: <0.1 kU/L
F202-IgE Cashew Nut: <0.1 kU/L
F203-IgE Pistachio Nut: <0.1 kU/L
F256-IgE Walnut: <0.1 kU/L
Macadamia Nut, IgE: <0.1 kU/L
Peanut, IgE: <0.1 kU/L
Pecan Nut IgE: <0.1 kU/L

## 2024-03-08 LAB — ALPHA-GAL PANEL
Allergen Lamb IgE: <0.1 kU/L
Beef IgE: <0.1 kU/L
IgE (Immunoglobulin E), Serum: 35 [IU]/mL (ref 6–495)
O215-IgE Alpha-Gal: <0.1 kU/L
Pork IgE: <0.1 kU/L

## 2024-03-08 LAB — ALLERGEN, KIWI FRUIT, F84: Kiwi Fruit: <0.1 kU/L

## 2024-03-08 LAB — ALLERGEN CANTALOUPE: Allergen Melon IgE: <0.1 kU/L

## 2024-03-08 LAB — ALLERGEN BANANA: Allergen Banana IgE: <0.1 kU/L

## 2024-03-09 DIAGNOSIS — N898 Other specified noninflammatory disorders of vagina: Secondary | ICD-10-CM | POA: Diagnosis not present

## 2024-03-09 DIAGNOSIS — Z01419 Encounter for gynecological examination (general) (routine) without abnormal findings: Secondary | ICD-10-CM | POA: Diagnosis not present

## 2024-03-09 DIAGNOSIS — Z124 Encounter for screening for malignant neoplasm of cervix: Secondary | ICD-10-CM | POA: Diagnosis not present

## 2024-03-09 DIAGNOSIS — N951 Menopausal and female climacteric states: Secondary | ICD-10-CM | POA: Diagnosis not present

## 2024-03-09 DIAGNOSIS — F3281 Premenstrual dysphoric disorder: Secondary | ICD-10-CM | POA: Diagnosis not present

## 2024-03-09 DIAGNOSIS — Z1151 Encounter for screening for human papillomavirus (HPV): Secondary | ICD-10-CM | POA: Diagnosis not present

## 2024-03-09 DIAGNOSIS — Z130 Encounter for screening for diseases of the blood and blood-forming organs and certain disorders involving the immune mechanism: Secondary | ICD-10-CM | POA: Diagnosis not present

## 2024-03-09 DIAGNOSIS — R3129 Other microscopic hematuria: Secondary | ICD-10-CM | POA: Diagnosis not present

## 2024-03-17 ENCOUNTER — Ambulatory Visit: Payer: BC Managed Care – PPO | Admitting: Psychology

## 2024-03-17 DIAGNOSIS — F411 Generalized anxiety disorder: Secondary | ICD-10-CM

## 2024-03-17 DIAGNOSIS — F3281 Premenstrual dysphoric disorder: Secondary | ICD-10-CM

## 2024-03-17 NOTE — Progress Notes (Signed)
 [facility] Counselor/Therapist Progress Note  Patient ID: Angela Harrell, MRN: [id number]   Date: 03/17/24  Time Spent: 10:06 am - 11:01  am : 55 Minutes  Treatment Type: Individual Therapy.  Reported Symptoms: depression and anxiety.   Mental Status Exam:  Appearance:  Casual     Behavior: Appropriate  Motor: Normal  Speech/Language:  Clear and Coherent and Normal Rate  Affect: Tearful  Mood: sad  Thought process: normal  Thought content:   WNL  Sensory/Perceptual disturbances:   WNL  Orientation: oriented to person, place, time/date, and situation  Attention: Good  Concentration: Good  Memory: WNL  Fund of knowledge:  Good  Insight:   Good  Judgment:  Good  Impulse Control: Good   Risk Assessment: Danger to Self:  No Self-injurious Behavior: No Danger to Others: No Duty to Warn:no Physical Aggression / Violence:No  Access to Firearms a concern: No  Gang Involvement:No   Subjective:   Angela Harrell participated from home, via video and consented to treatment. Therapist participated from home office. She discussed the events of the past week. She noted recent unexpected financial stressors. We worked on exploring the session and the effect on her mood. She noted her effort in reminding myself that I can question people including why did you say that? And what did you mean by that?. She noted a need to prep herself for not being bullied by her eldest sister in-law. She provided various examples of her SIL's behavior in the past. Therapist praised Angela for her effort to be more assertive and to advocate for self more consistently. She noted realizing that her irritation related to people attributing her work to themselves is primarily due to her father during the same throughout her childhood. We worked on exploring this during the session. She noted her continued effort to maintain boundaries at home with her family and her efforts to hold them  accountable with their commitments and tasks while attempting to not manage these areas for them. She noted that putting self-first feels selfish. She noted the possibility of her childhood influencing this perspective. She noted how she often feels on edge, around her father, and noted worry that he might explode due to his history of low frustration tolerance. She noted the toll she experiences as a result of being around his father, even if he behaves appropriately, and was tearful during this disclosure. She noted her avoidance of her father resulting in feelings of guilt for not helping her mother out as much with Angela Harrell's father's care. We will continue to explore this going forward. Therapist validated Angela Harrell's feeling and experience during the session. Therapist provided supportive therapy. A follow-up is scheduled for continued treatment, which she benefits from.   Diagnosis:  Generalized anxiety disorder  PMDD (premenstrual dysphoric disorder)  Psychiatric Treatment: Yes , Angela Sayers, NP.    Treatment Plan:  Client Abilities/Strengths Angela Harrell is intelligent, self-aware, and motivated for change.   Support System: Family and friends.   Client Treatment Preferences Outpatient therapy and continued psychiatric treatment.   Client Statement of Needs Angela Harrell would like to engage in activities that she previously avoided due to anxiety, building and maintaining a daily routine, managing distress, managing overall symptoms, processing past events, building a larger support system, maintain sessions with psychiatric provider, being more assertive, verbalizing needs, and setting boundaries with others, managing social pressure (owing explanations or answering requests immediately).    Treatment Level Weekly  Symptoms  Depression: feeling down, poor sleep, lethargy,  fluctuating appetite, feeling bad about self.   (Status: maintained) Anxiety:  Feeling anxious, difficulty managing  worry, worrying about different things, trouble relaxing, irritability, and feeling afraid something awful might happen.   (Status: maintained)  Goals:   Angela Harrell experiences symptoms of depression and anxiety.   Treatment plan signed and available on s-drive:   No, pending signature page.  Angela Harrell was sent the treatment plan signature form on 11/12/23.   Target Date: 11/11/24 Frequency: Weekly  Progress: 0 Modality: individual    Therapist will provide referrals for additional resources as appropriate.  Therapist will provide psycho-education regarding Angela Harrell's diagnosis and corresponding treatment approaches and interventions. Angela Mullet, [name] will support the patient's ability to achieve the goals identified. will employ CBT, BA, Problem-solving, Solution Focused, Mindfulness,  coping skills, & other evidenced-based practices will be used to promote progress towards healthy functioning to help manage decrease symptoms associated with her diagnosis.   Reduce overall level, frequency, and intensity of the feelings of depression, anxiety.  evidenced by decreased overall symptoms from 6 to 7 days/week to 0 to 1 days/week per client report for at least 3 consecutive months. Verbally express understanding of the relationship between feelings of depression, anxiety and their impact on thinking patterns and behaviors. Verbalize an understanding of the role that distorted thinking plays in creating fears, excessive worry, and ruminations.    [name] participated in the creation of the treatment plan)   Angela Mullet, [name]

## 2024-03-18 DIAGNOSIS — Z1231 Encounter for screening mammogram for malignant neoplasm of breast: Secondary | ICD-10-CM | POA: Diagnosis not present

## 2024-03-18 LAB — HM MAMMOGRAPHY

## 2024-03-23 ENCOUNTER — Encounter: Payer: Self-pay | Admitting: Family Medicine

## 2024-03-24 ENCOUNTER — Ambulatory Visit: Payer: Self-pay

## 2024-03-24 ENCOUNTER — Encounter: Payer: Self-pay | Admitting: Family Medicine

## 2024-03-24 ENCOUNTER — Ambulatory Visit: Admitting: Family Medicine

## 2024-03-24 VITALS — BP 128/84 | HR 85 | Temp 97.7°F | Resp 16 | Ht 63.0 in | Wt 198.4 lb

## 2024-03-24 DIAGNOSIS — R1031 Right lower quadrant pain: Secondary | ICD-10-CM

## 2024-03-24 DIAGNOSIS — R319 Hematuria, unspecified: Secondary | ICD-10-CM | POA: Diagnosis not present

## 2024-03-24 LAB — POC URINALSYSI DIPSTICK (AUTOMATED)
Bilirubin, UA: NEGATIVE
Blood, UA: NEGATIVE
Glucose, UA: NEGATIVE
Ketones, UA: NEGATIVE
Leukocytes, UA: NEGATIVE
Nitrite, UA: NEGATIVE
Protein, UA: NEGATIVE
Spec Grav, UA: 1.01 (ref 1.010–1.025)
Urobilinogen, UA: 0.2 U/dL
pH, UA: 6 (ref 5.0–8.0)

## 2024-03-24 NOTE — Telephone Encounter (Signed)
 I spoke with the patient and she is agreeable to be seen asap today

## 2024-03-24 NOTE — Progress Notes (Signed)
 ACUTE VISIT Chief Complaint  Patient presents with   Hematuria   Discussed the use of AI scribe software for clinical note transcription with the patient, who gave verbal consent to proceed. History of Present Illness Angela Harrell is a 48 year old female with a PMHx significant for bilateral nephrolithiasis, GERD, hyperlipidemia, vitamin D  deficiency, and anxiety who presents with right-sided abdominal pain and suspected kidney stone. We received a triage call reporting hematuria and not being able to see urology sooner than 05/2024; so we called her and asked to come today.  She has been experiencing dull, intermittent, and tolerable right-sided abdominal pain, RLQ, for the past three to four weeks.  Pain is not radiated. She has not identified exacerbating or alleviating factors.  She states that some times it takes up to six months to pass. She is not taking any medication for the pain. She has not noted gross hematuria but about two weeks ago, a routine gynecological exam revealed macroscopic hematuria. At that time, she experienced mild pain. She denies dysuria or changes in urinary frequency.  She reports nausea and a sudden chill last night, but no fever. No changes in bowel habits beyond her usual post-cholecystectomy symptoms, although her bowel movements were slightly looser this past week, which she attributes to missing her daily fiber supplement.  She believes her kidney stones may be uric acid-based and was advised to avoid foods high in purines.  She has not seen a stone pass recently and often does not see them when they do pass. LMP 03/15/24.  Review of Systems  Constitutional:  Negative for activity change, appetite change and fever.  HENT:  Negative for sore throat.   Respiratory:  Negative for cough, shortness of breath and wheezing.   Gastrointestinal:  Negative for blood in stool and vomiting.  Genitourinary:  Negative for decreased urine volume, difficulty  urinating, vaginal bleeding and vaginal discharge.  Skin:  Negative for rash.  Neurological:  Negative for syncope and weakness.  See other pertinent positives and negatives in HPI.  Current Outpatient Medications on File Prior to Visit  Medication Sig Dispense Refill   albuterol  (PROVENTIL  HFA) 108 (90 Base) MCG/ACT inhaler Inhale 2 puffs into the lungs every 6 (six) hours as needed for wheezing or shortness of breath. 1 each 0   Cholecalciferol (VITAMIN D3 PO) Take 2,000 mg by mouth daily.      EPINEPHrine  0.3 mg/0.3 mL IJ SOAJ injection Inject 0.3 mg into the muscle as needed for anaphylaxis. 2 each 1   MAGNESIUM PO Take by mouth.     Olopatadine-Mometasone (RYALTRIS ) 665-25 MCG/ACT SUSP Place 1-2 sprays into the nose in the morning and at bedtime. 29 g 5   Omega-3 Fatty Acids (FISH OIL) 1000 MG CAPS Take by mouth.     Probiotic Product (PROBIOTIC PO) Take 1 tablet by mouth every other day.     PSYLLIUM HUSK PO Take 2 capsules by mouth daily.     rosuvastatin  (CRESTOR ) 10 MG tablet Take 1 tablet (10 mg total) by mouth daily. 90 tablet 3   sertraline  (ZOLOFT ) 25 MG tablet Take 1 tablet (25 mg total) by mouth daily. 30 tablet 0   Triamcinolone  Acetonide (TRIAMCINOLONE  0.1 % CREAM : EUCERIN) CREA Apply 1 application topically 2 (two) times daily as needed for rash or itching. 500 each 1   Turmeric Curcumin 500 MG CAPS Take by mouth.     Current Facility-Administered Medications on File Prior to Visit  Medication Dose Route  Frequency Provider Last Rate Last Admin   0.9 %  sodium chloride  infusion  500 mL Intravenous Continuous Cirigliano, Vito V, DO        Past Medical History:  Diagnosis Date   ALLERGIC RHINITIS 05/31/2008   Anemia    ANXIETY 05/31/2008   ASTHMA 05/31/2008   Asthma    CHOLELITHIASIS 05/31/2008   High blood pressure    readings with stones   History of kidney stones    History of right breast biopsy 2017   HYPERLIPIDEMIA 05/31/2008   Kidney stone     NEPHROLITHIASIS, HX OF 05/31/2008   Allergies  Allergen Reactions   Beef-Derived Drug Products Diarrhea    Nausea and vomiting   Prochlorperazine Edisylate Shortness Of Breath    Compazine Tongue swelling   Prochlorperazine Edisylate Swelling    Swelling of throat   Tree Extract Anaphylaxis    Throat itchy, roof of mouth gets raw   Banana Itching and Nausea And Vomiting    Throat gets itchy   Beef (Bovine) Protein     Diarrhea and stomach cramps    Oxycodone-Acetaminophen      Vomiting   Permethrin Rash    Rash and fever   Statins Itching   Morphine And Codeine Itching   Other     Tree Nuts (can have peanuts and cashews) Pyrethrine (ingredient in lice products)- fever and rash   Oxycodone-Acetaminophen  Nausea And Vomiting   Latex Hives and Rash    Rash    Social History   Socioeconomic History   Marital status: Married    Spouse name: Not on file   Number of children: 3   Years of education: BS   Highest education level: Bachelor's degree (e.g., BA, AB, BS)  Occupational History   Occupation: Metallurgist   Occupation: Substitute  Tobacco Use   Smoking status: Never   Smokeless tobacco: Never  Substance and Sexual Activity   Alcohol use: No   Drug use: No   Sexual activity: Yes    Birth control/protection: Surgical  Other Topics Concern   Not on file  Social History Narrative   Lives with husband & 3 kids, work at home, PT outside of home. Often volunteers.   Social Drivers of Corporate investment banker Strain: Low Risk  (01/07/2024)   Overall Financial Resource Strain (CARDIA)    Difficulty of Paying Living Expenses: Not very hard  Food Insecurity: No Food Insecurity (01/07/2024)   Hunger Vital Sign    Worried About Running Out of Food in the Last Year: Never true    Ran Out of Food in the Last Year: Never true  Transportation Needs: No Transportation Needs (01/07/2024)   PRAPARE - Administrator, Civil Service (Medical): No    Lack of  Transportation (Non-Medical): No  Physical Activity: Unknown (01/07/2024)   Exercise Vital Sign    Days of Exercise per Week: 5 days    Minutes of Exercise per Session: Patient declined  Stress: Patient Declined (01/07/2024)   [employer] of Occupational Health - Occupational Stress Questionnaire    Feeling of Stress: Patient declined  Social Connections: Socially Integrated (01/07/2024)   Social Connection and Isolation Panel    Frequency of Communication with Friends and Family: More than three times a week    Frequency of Social Gatherings with Friends and Family: Once a week    Attends Religious Services: More than 4 times per year    Active Member of [employer] or Organizations: Yes  Attends Banker Meetings: More than 4 times per year    Marital Status: Married    Vitals:   03/24/24 1124  BP: 128/84  Pulse: 85  Resp: 16  Temp: 97.7 F (36.5 C)  SpO2: 97%   Body mass index is 35.14 kg/m.  Physical Exam Vitals and nursing note reviewed.  Constitutional:      General: She is not in acute distress.    Appearance: She is well-developed. She is not ill-appearing.  HENT:     Head: Normocephalic and atraumatic.  Eyes:     Conjunctiva/sclera: Conjunctivae normal.  Cardiovascular:     Rate and Rhythm: Normal rate and regular rhythm.  Pulmonary:     Effort: Pulmonary effort is normal. No respiratory distress.     Breath sounds: Normal breath sounds.  Abdominal:     Palpations: Abdomen is soft. There is no mass.     Tenderness: There is abdominal tenderness (mild) in the right lower quadrant. There is no right CVA tenderness, left CVA tenderness, guarding or rebound. Negative signs include McBurney's sign.   Skin:    General: Skin is warm.     Findings: No erythema.  Neurological:     General: No focal deficit present.     Mental Status: She is alert and oriented to person, place, and time.     Gait: Gait normal.  Psychiatric:        Mood and Affect: Mood  and affect normal.   ASSESSMENT AND PLAN:  Ms Kyser was seen today because reported hematuria and right-sided abdominal pain.  Hematuria, unspecified type Microscopic hematuria reported at her gynecologist office about 2 weeks ago, she has not had gross hematuria. Urine dipstick today negative. History at this time is not very suggestive of acute renal colic. We do not have imaging at this time but I do not think we need any at this time. Urine strainer given. Instructed about warning signs. Will keep appointment with urologist in 05/2024.  -     POCT Urinalysis Dipstick (Automated)  RLQ abdominal pain We discussed possible causes of abdominal pain, I do not think it is caused by nephrolithiasis. Examination and history do not suggest a serious process, no signs of acute abdomen today. I think we can hold on blood work for [employer]. Monitor for new symptoms. She was clearly instructed about warning signs.  Return if symptoms worsen or fail to improve.  Suede Greenawalt G. [name], MD  [facility]. [place] office.

## 2024-03-24 NOTE — Telephone Encounter (Signed)
 FYI Only or Action Required?: FYI only for provider.  Patient was last seen in primary care on 01/09/2024 by [name], [name], MD.  Called Nurse Triage reporting Hematuria and Flank Pain.  Symptoms began several weeks ago.  Interventions attempted: Rest, hydration, or home remedies.  Symptoms are: unchanged.  Triage Disposition: See Physician Within 24 Hours  Patient/caregiver understands and will follow disposition?: Yes, but will wait   Copied from CRM #[id number]. Topic: Clinical - Red Word Triage >> Mar 24, 2024  9:08 AM Larissa RAMAN wrote: [facility] that prompted transfer to Nurse Triage: blood in urine with RT side and RT hip pain Reason for Disposition  Side (flank) or back pain present  Answer Assessment - Initial Assessment Questions Additional info: At gynecology office noted wbc and rbc in urine, she was referred to nephrology but they are booking in November. She is requesting pcp appointment. Scheduled next available with pcp on Friday  Note: call was disconnected during triage wrap up.    1. COLOR of URINE: Describe the color of the urine.  (e.g., tea-colored, pink, red, bloody) Do you have blood clots in your urine? (e.g., none, pea, grape, small coin)     Darker in color- tea 2. ONSET: When did the bleeding start?      Couple weeks 3. EPISODES: How many times has there been blood in the urine? or How many times today?      4. PAIN with URINATION: Is there any pain with passing your urine? If Yes, ask: How bad is the pain?  (Scale 1-10; or mild, moderate, severe)     Denies  5. FEVER: Do you have a fever? If Yes, ask: What is your temperature, how was it measured, and when did it start?     Denies  6. ASSOCIATED SYMPTOMS: Are you passing urine more frequently than usual?     unsure 7. OTHER SYMPTOMS: Do you have any other symptoms? (e.g., back/flank pain, abdomen pain, vomiting)     Right hip and flank pain  Protocols used: Urine - Blood  In-A-[facility]

## 2024-03-24 NOTE — Patient Instructions (Addendum)
 A few things to remember from today's visit:  Hematuria, unspecified type - Plan: POCT Urinalysis Dipstick (Automated)  Monitor for new symptoms. Keep appt with urologist.  Do not use My Chart to request refills or for acute issues that need immediate attention. If you send a my chart message, it may take a few days to be addressed, specially if I am not in the office.  Please be sure medication list is accurate. If a new problem present, please set up appointment sooner than planned today.

## 2024-03-24 NOTE — Telephone Encounter (Signed)
 Can she come now? Thanks, BJ

## 2024-03-26 ENCOUNTER — Ambulatory Visit: Admitting: Family Medicine

## 2024-03-31 ENCOUNTER — Ambulatory Visit: Payer: BC Managed Care – PPO | Admitting: Psychology

## 2024-03-31 DIAGNOSIS — F3281 Premenstrual dysphoric disorder: Secondary | ICD-10-CM | POA: Diagnosis not present

## 2024-03-31 DIAGNOSIS — F411 Generalized anxiety disorder: Secondary | ICD-10-CM | POA: Diagnosis not present

## 2024-03-31 NOTE — Progress Notes (Signed)
 [facility] Counselor/Therapist Progress Note  Patient ID: Angela Harrell, MRN: [id number]   Date: 03/31/24  Time Spent: 10:05 am - 11:02  am : 57 Minutes  Treatment Type: Individual Therapy.  Reported Symptoms: depression and anxiety.   Mental Status Exam:  Appearance:  Casual     Behavior: Appropriate  Motor: Normal  Speech/Language:  Clear and Coherent and Normal Rate  Affect: Congruent and Tearful  Mood: normal  Thought process: normal  Thought content:   WNL  Sensory/Perceptual disturbances:   WNL  Orientation: oriented to person, place, time/date, and situation  Attention: Good  Concentration: Good  Memory: WNL  Fund of knowledge:  Good  Insight:   Good  Judgment:  Good  Impulse Control: Good   Risk Assessment: Danger to Self:  No Self-injurious Behavior: No Danger to Others: No Duty to Warn:no Physical Aggression / Violence:No  Access to Firearms a concern: No  Gang Involvement:No   Subjective:   Angela Harrell participated from home, via video and consented to treatment. Therapist participated from home office. She discussed the events of the past week. 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 We worked on exploring her frustrations regarding this stress.  She noted her efforts to set boundaries for others, advocate for self, and be assertive. She noted having more comfort with setting boundaries and being assertive than in the past. She noted experience anxiety last week and attributed this to her hormones. She noted difficulty sitting still and noted feeling very anxious. She noted reaching out to friends for support. She noted employing breathing, walking, and meditation. She noted difficulty managing her anxiety with these tools. She noted her cycle affecting her by physically and psychologically. She noted speaking to her OB about this. She noted her husband's continued struggles managing frustration and encouraged  him to discuss this with his therapist. She noted communicating to her husband a need for him to be more mindful of his mood and behavior and to be more communicative. 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 Therapist praised Angela for her effort to manage interpersonal stress, communicate assertively, and set boundaries. Therapist encouraged continued effort in this area. Therapist validated Angela Harrell's feelings and experience and provided supportive therapy. Angela Harrell has a follow-up scheduled and continues to benefit from counseling.   Diagnosis:  Generalized anxiety disorder  PMDD (premenstrual dysphoric disorder)  Psychiatric Treatment: Yes , [name], NP.    Treatment Plan:  Client Abilities/Strengths Azelia is intelligent, self-aware, and motivated for change.   Support System: Family and friends.   Client Treatment Preferences Outpatient therapy and continued psychiatric treatment.   Client Statement of Needs Angela Harrell would like to engage in activities that she previously avoided due to anxiety, building and maintaining a daily routine, managing distress, managing overall symptoms, processing past events, building a larger support system, maintain sessions with psychiatric provider, being more assertive, verbalizing needs, and setting boundaries with others, managing social pressure (owing explanations or answering requests immediately).    Treatment Level Weekly  Symptoms  Depression: feeling down, poor sleep, lethargy, fluctuating appetite, feeling bad about self.   (Status: maintained) Anxiety:  Feeling anxious, difficulty managing worry, worrying about different things, trouble relaxing, irritability, and feeling afraid something awful might happen.   (Status: maintained)  Goals:   Angela Harrell experiences symptoms of depression and anxiety.    Treatment plan signed  and available on s-drive:   No, pending signature page.  Angela Harrell was sent the treatment plan signature form on 11/12/23.   Target Date: 11/11/24 Frequency: Weekly  Progress: 0 Modality: individual    Therapist will provide referrals for additional resources as appropriate.  Therapist will provide psycho-education regarding Angela Harrell's diagnosis and corresponding treatment approaches and interventions. [name], [name] will support the patient's ability to achieve the goals identified. will employ CBT, BA, Problem-solving, Solution Focused, Mindfulness,  coping skills, & other evidenced-based practices will be used to promote progress towards healthy functioning to help manage decrease symptoms associated with her diagnosis.   Reduce overall level, frequency, and intensity of the feelings of depression, anxiety.  evidenced by decreased overall symptoms from 6 to 7 days/week to 0 to 1 days/week per client report for at least 3 consecutive months. Verbally express understanding of the relationship between feelings of depression, anxiety and their impact on thinking patterns and behaviors. Verbalize an understanding of the role that distorted thinking plays in creating fears, excessive worry, and ruminations.    [name] participated in the creation of the treatment plan)   [name], [name]

## 2024-04-14 ENCOUNTER — Ambulatory Visit: Payer: BC Managed Care – PPO | Admitting: Psychology

## 2024-04-14 DIAGNOSIS — F411 Generalized anxiety disorder: Secondary | ICD-10-CM

## 2024-04-14 DIAGNOSIS — F3281 Premenstrual dysphoric disorder: Secondary | ICD-10-CM | POA: Diagnosis not present

## 2024-04-14 NOTE — Progress Notes (Signed)
 [facility] Counselor/Therapist Progress Note  Patient ID: Angela Harrell, MRN: [id number]   Date: 04/14/24  Time Spent: 10:04 am - 10:59 am : 55  Minutes  Treatment Type: Individual Therapy.  Reported Symptoms: depression and anxiety.   Mental Status Exam:  Appearance:  Casual     Behavior: Appropriate  Motor: Normal  Speech/Language:  Clear and Coherent and Normal Rate  Affect: Congruent and Tearful  Mood: normal  Thought process: normal  Thought content:   WNL  Sensory/Perceptual disturbances:   WNL  Orientation: oriented to person, place, time/date, and situation  Attention: Good  Concentration: Good  Memory: WNL  Fund of knowledge:  Good  Insight:   Good  Judgment:  Good  Impulse Control: Good   Risk Assessment: Danger to Self:  No Self-injurious Behavior: No Danger to Others: No Duty to Warn:no Physical Aggression / Violence:No  Access to Firearms a concern: No  Gang Involvement:No   Subjective:   Angela Harrell participated from home, via video and consented to treatment. Therapist participated from home office. She discussed the events of the past week. We were disconnected towards the latter portion of the session and therapist had difficulty reconnecting. Angela Harrell has a follow-up scheduled and continued to benefit from treatment. We had connectivity issues with video during the latter portion of the session and switched to audio. >75% of the session was conducted via video. 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 She noted added frustration regarding the lack of planning of forethought. She noted her continued effort to impress upon others how these plans are not viable. She noted difficulty letting the group deal with the consequences of their decisions due to the cost to others. We worked identifying areas of  flexibility and additional boundaries. Therapist highlighted the similar roles that Angela Harrell has with family and in her volunteering, which she is hoping to move away from. She acknowledged that this role is antithetical to her outlined goals for treatment. We will work on identifying ways to address these stressors in a way that aligns with her goals while allowing for her to give feedback. She noted her commitment to identify and set boundaries with others and manage the discomfort of people's choices and not having control. We will continue to work on identifying and managing the distress.  Angela Harrell was engaged and receptive during the session. She expressed commitment towards goals. Therapist praised Angela for her effort during the session and provided supportive therapy. A follow-up was scheduled for continued treatment, which she benefits from.   Diagnosis:  Generalized anxiety disorder  PMDD (premenstrual dysphoric disorder)  Psychiatric Treatment: Yes , [name], NP.    Treatment Plan:  Client Abilities/Strengths Angela Harrell is intelligent, self-aware, and motivated for change.   Support System: Family and friends.   Client Treatment Preferences Outpatient therapy and continued psychiatric treatment.   Client Statement of Needs Angela Harrell would like to engage in activities that she previously avoided due to anxiety, building and maintaining a daily routine, managing distress, managing overall symptoms, processing past events, building a larger support system, maintain sessions with psychiatric provider, being more assertive, verbalizing needs, and setting boundaries with others, managing social pressure (owing explanations or answering requests immediately).    Treatment Level Weekly  Symptoms  Depression: feeling down, poor sleep, lethargy, fluctuating appetite, feeling bad about self.   (Status: maintained) Anxiety:  Feeling  anxious, difficulty managing worry, worrying about different  things, trouble relaxing, irritability, and feeling afraid something awful might happen.   (Status: maintained)  Goals:   Angela Harrell experiences symptoms of depression and anxiety.   Treatment plan signed and available on s-drive:   No, pending signature page.  Angela Harrell was sent the treatment plan signature form on 11/12/23.   Target Date: 11/11/24 Frequency: Weekly  Progress: 0 Modality: individual    Therapist will provide referrals for additional resources as appropriate.  Therapist will provide psycho-education regarding Hazley's diagnosis and corresponding treatment approaches and interventions. Angela Mullet, [name] will support the patient's ability to achieve the goals identified. will employ CBT, BA, Problem-solving, Solution Focused, Mindfulness,  coping skills, & other evidenced-based practices will be used to promote progress towards healthy functioning to help manage decrease symptoms associated with her diagnosis.   Reduce overall level, frequency, and intensity of the feelings of depression, anxiety.  evidenced by decreased overall symptoms from 6 to 7 days/week to 0 to 1 days/week per client report for at least 3 consecutive months. Verbally express understanding of the relationship between feelings of depression, anxiety and their impact on thinking patterns and behaviors. Verbalize an understanding of the role that distorted thinking plays in creating fears, excessive worry, and ruminations.    Angela Harrell participated in the creation of the treatment plan)   Angela Mullet, [name]

## 2024-04-28 ENCOUNTER — Ambulatory Visit: Payer: BC Managed Care – PPO | Admitting: Psychology

## 2024-04-28 DIAGNOSIS — F3281 Premenstrual dysphoric disorder: Secondary | ICD-10-CM | POA: Diagnosis not present

## 2024-04-28 DIAGNOSIS — F411 Generalized anxiety disorder: Secondary | ICD-10-CM

## 2024-04-28 NOTE — Progress Notes (Signed)
 [facility] Counselor/Therapist Progress Note  Patient ID: Angela Harrell, MRN: [id number]   Date: 04/28/24  Time Spent: 10:05 am - 11:01 am :  56 Minutes  Treatment Type: Individual Therapy.  Reported Symptoms: depression and anxiety.   Mental Status Exam:  Appearance:  Casual     Behavior: Appropriate  Motor: Normal  Speech/Language:  Clear and Coherent and Normal Rate  Affect: Congruent  Mood: dysthymic  Thought process: normal  Thought content:   WNL  Sensory/Perceptual disturbances:   WNL  Orientation: oriented to person, place, time/date, and situation  Attention: Good  Concentration: Good  Memory: WNL  Fund of knowledge:  Good  Insight:   Good  Judgment:  Good  Impulse Control: Good   Risk Assessment: Danger to Self:  No Self-injurious Behavior: No Danger to Others: No Duty to Warn:no Physical Aggression / Violence:No  Access to Firearms a concern: No  Gang Involvement:No   Subjective:   Angela Harrell participated from home, via video and consented to treatment. Therapist participated from home office. She discussed the events of the past week.  She noted being angsty and stressed, noted this likely due to her upcoming cycle. She noted sometimes when I get like this, I try to figure out what I can control. She noted that her symptoms, during this time, vacillate and can be difficult to track. She noted her effort to be more mindful of the calendar to aid in this process. 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 She noted having to be a problem-solver due to people's lack of overall planning. She noted that it's hard for me to watch things go poorly. She noted having support, from friends, during that time. She noted feeling annoying when I am being assertive. We worked on processing this self-talk during the session. She noted a high level of discomfort when not in control.  She noted difficulty not fixing it when it affects the children. We will continue to process this going forward and ways to manage her needs as she advocates for others. Therapist validated Angela Harrell's feelings and experience during the session. She continued to benefit from consistent therapy and has a follow-up scheduled during the session.   Diagnosis:  Generalized anxiety disorder  PMDD (premenstrual dysphoric disorder)  Psychiatric Treatment: Yes , Angela Sayers, NP.    Treatment Plan:  Client Abilities/Strengths Angela Harrell is intelligent, self-aware, and motivated for change.   Support System: Family and friends.   Client Treatment Preferences Outpatient therapy and continued psychiatric treatment.   Client Statement of Needs Angela Harrell would like to engage in activities that she previously avoided due to anxiety, building and maintaining a daily routine, managing distress, managing overall symptoms, processing past events, building a larger support system, maintain sessions with psychiatric provider, being more assertive, verbalizing needs, and setting boundaries with others, managing social pressure (owing explanations or answering requests immediately).    Treatment Level Weekly  Symptoms  Depression: feeling down, poor sleep, lethargy, fluctuating appetite, feeling bad about self.   (Status: maintained) Anxiety:  Feeling anxious, difficulty managing worry, worrying about different things, trouble relaxing, irritability, and feeling afraid something awful might happen.   (Status: maintained)  Goals:   Angela Harrell experiences symptoms of depression and anxiety.   Treatment plan signed and available on s-drive:   No, pending signature page.  Angela Harrell Gilland was sent the treatment plan signature form on 11/12/23.   Target Date: 11/11/24 Frequency: Weekly  Progress:  0 Modality: individual    Therapist will provide referrals for additional resources as appropriate.  Therapist will provide  psycho-education regarding Angela Harrell's diagnosis and corresponding treatment approaches and interventions. Angela Mullet, [name] will support the patient's ability to achieve the goals identified. will employ CBT, BA, Problem-solving, Solution Focused, Mindfulness,  coping skills, & other evidenced-based practices will be used to promote progress towards healthy functioning to help manage decrease symptoms associated with her diagnosis.   Reduce overall level, frequency, and intensity of the feelings of depression, anxiety.  evidenced by decreased overall symptoms from 6 to 7 days/week to 0 to 1 days/week per client report for at least 3 consecutive months. Verbally express understanding of the relationship between feelings of depression, anxiety and their impact on thinking patterns and behaviors. Verbalize an understanding of the role that distorted thinking plays in creating fears, excessive worry, and ruminations.    Angela Harrell participated in the creation of the treatment plan)   Angela Mullet, [name]

## 2024-05-12 ENCOUNTER — Ambulatory Visit: Payer: BC Managed Care – PPO | Admitting: Psychology

## 2024-05-12 DIAGNOSIS — F411 Generalized anxiety disorder: Secondary | ICD-10-CM | POA: Diagnosis not present

## 2024-05-12 DIAGNOSIS — F3281 Premenstrual dysphoric disorder: Secondary | ICD-10-CM | POA: Diagnosis not present

## 2024-05-12 NOTE — Progress Notes (Signed)
 [facility] Counselor/Therapist Progress Note  Patient ID: Angela Harrell, MRN: [id number]   Date: 05/12/24  Time Spent: 10:02 am - 11:01 am :  59 Minutes  Treatment Type: Individual Therapy.  Reported Symptoms: depression and anxiety.   Mental Status Exam:  Appearance:  Casual     Behavior: Appropriate  Motor: Normal  Speech/Language:  Clear and Coherent and Normal Rate  Affect: Congruent  Mood: dysthymic  Thought process: normal  Thought content:   WNL  Sensory/Perceptual disturbances:   WNL  Orientation: oriented to person, place, time/date, and situation  Attention: Good  Concentration: Good  Memory: WNL  Fund of knowledge:  Good  Insight:   Good  Judgment:  Good  Impulse Control: Good   Risk Assessment: Danger to Self:  No Self-injurious Behavior: No Danger to Others: No Duty to Warn:no Physical Aggression / Violence:No  Access to Firearms a concern: No  Gang Involvement:No   Subjective:   Angela Harrell participated from car, via video and consented to treatment. Therapist participated from home office. She discussed the events of the past week. Angela Harrell noted her effort set boundaries this past week. She noted feeling pressure from her maternal grandmother to organize Angela Harrell's parents' 50th anniversary celebration. She noted declining to expand the plans of this celebration and noted her consistent effort to maintain her boundaries, despite her grandmother's attempts. She noted her effort to not step in to address issues that are unrelated to her.  She noted her efforts to set boundaries with her husband and children, as well. She noted continued areas of focus for her and her husband and noted the possibility of this being affected by his inattention. She noted encouraging him to get additional testing, a psychological evaluation, despite previously completing [employer] testing with a local provider. She noted her anxiety related to the upcoming holidays and  having to interact with her sister in-law and noted discussing this with her husband. She noted her continued struggles related to her father's worsening dementia and increasing antagonism. She noted her continued effort to manage her feelings in this area, including areas of guilt. She noted her continued effort to exercise and eat healthfully but noted recently experiencing a plateau in relation to weight-loss. She noted her continued effort to maintain her healthful behavior and added her effort towards building and maintain balance in day-to-day life including self-care. Therapist validated Angela Harrell's feelings and experience during the session, praised her effort to address her goals between session, and provided supportive therapy.   Diagnosis:  Generalized anxiety disorder  PMDD (premenstrual dysphoric disorder)  Psychiatric Treatment: Yes , [name], NP.    Treatment Plan:  Client Abilities/Strengths Angela Harrell is intelligent, self-aware, and motivated for change.   Support System: Family and friends.   Client Treatment Preferences Outpatient therapy and continued psychiatric treatment.   Client Statement of Needs Angela Harrell would like to engage in activities that she previously avoided due to anxiety, building and maintaining a daily routine, managing distress, managing overall symptoms, processing past events, building a larger support system, maintain sessions with psychiatric provider, being more assertive, verbalizing needs, and setting boundaries with others, managing social pressure (owing explanations or answering requests immediately).    Treatment Level Weekly  Symptoms  Depression: feeling down, poor sleep, lethargy, fluctuating appetite, feeling bad about self.   (Status: maintained) Anxiety:  Feeling anxious, difficulty managing worry, worrying about different things, trouble relaxing, irritability, and feeling afraid something awful might happen.   (Status:  maintained)  Goals:  Angela Harrell experiences symptoms of depression and anxiety.   Treatment plan signed and available on s-drive:   No, pending signature page.  Angela Harrell was sent the treatment plan signature form on 11/12/23.   Target Date: 11/11/24 Frequency: Weekly  Progress: 0 Modality: individual    Therapist will provide referrals for additional resources as appropriate.  Therapist will provide psycho-education regarding Angela Harrell's diagnosis and corresponding treatment approaches and interventions. Angela Mullet, [name] will support the patient's ability to achieve the goals identified. will employ CBT, BA, Problem-solving, Solution Focused, Mindfulness,  coping skills, & other evidenced-based practices will be used to promote progress towards healthy functioning to help manage decrease symptoms associated with her diagnosis.   Reduce overall level, frequency, and intensity of the feelings of depression, anxiety.  evidenced by decreased overall symptoms from 6 to 7 days/week to 0 to 1 days/week per client report for at least 3 consecutive months. Verbally express understanding of the relationship between feelings of depression, anxiety and their impact on thinking patterns and behaviors. Verbalize an understanding of the role that distorted thinking plays in creating fears, excessive worry, and ruminations.    [name] participated in the creation of the treatment plan)   Angela Mullet, [name]

## 2024-05-20 ENCOUNTER — Ambulatory Visit (INDEPENDENT_AMBULATORY_CARE_PROVIDER_SITE_OTHER): Admitting: Allergy

## 2024-05-20 ENCOUNTER — Other Ambulatory Visit: Payer: Self-pay

## 2024-05-20 ENCOUNTER — Encounter: Payer: Self-pay | Admitting: Allergy

## 2024-05-20 VITALS — BP 108/80 | HR 79 | Temp 98.5°F | Resp 16 | Ht 63.0 in | Wt 194.0 lb

## 2024-05-20 DIAGNOSIS — R12 Heartburn: Secondary | ICD-10-CM

## 2024-05-20 DIAGNOSIS — Z889 Allergy status to unspecified drugs, medicaments and biological substances status: Secondary | ICD-10-CM | POA: Diagnosis not present

## 2024-05-20 DIAGNOSIS — J3089 Other allergic rhinitis: Secondary | ICD-10-CM | POA: Diagnosis not present

## 2024-05-20 DIAGNOSIS — J4520 Mild intermittent asthma, uncomplicated: Secondary | ICD-10-CM

## 2024-05-20 DIAGNOSIS — R053 Chronic cough: Secondary | ICD-10-CM

## 2024-05-20 DIAGNOSIS — L2089 Other atopic dermatitis: Secondary | ICD-10-CM

## 2024-05-20 DIAGNOSIS — T7819XD Other adverse food reactions, not elsewhere classified, subsequent encounter: Secondary | ICD-10-CM | POA: Diagnosis not present

## 2024-05-20 NOTE — Progress Notes (Signed)
 Follow Up Note  RE: Angela Harrell MRN: [id number] DOB: 06/21/1976 Date of Office Visit: 05/20/2024  Referring provider: Jordan, [name], MD Primary care provider: Jordan, [name], MD  Chief Complaint: Follow-up  History of Present Illness: I had the pleasure of seeing Angela Harrell for a follow up visit at the [facility] on 05/20/2024. She is a 48 y.o. female, who is being followed for allergic rhino conjunctivitis, adverse food reactions, chronic cough, reactive airway disease, heartburn, multiple drug allergies. Her previous allergy  office visit was on 02/17/2024 with Dr. Luke. Today is a regular follow up visit.  Discussed the use of AI scribe software for clinical note transcription with the patient, who gave verbal consent to proceed.    She experiences ongoing sinus issues and allergy  symptoms, particularly sensitivity to mold and pollen. Irritation and sinus congestion are prominent, especially during high pollen seasons in [place] . [place] due to pollen accumulation. She uses a nasal spray, Rialtris, a few times a week, which has reduced her cough and drainage, although she sometimes forgets to use it daily as it is not part of her routine yet.  She avoids certain foods such as bananas, beef, tree nuts, melons, and kiwi due to oral irritation and tingling upon exposure. Despite negative allergy  tests for these foods, she continues to avoid them to prevent symptoms. She has never used an Epipen  for these reactions but has one prescribed.  A previous nasal endoscopy showed no polyps. She used to perform nasal irrigation with a neti pot, which she found helpful, but has fallen out of the habit since 2020.  She takes an allergy  pill as part of her morning routine which seems to help. She has not used her inhaler recently.     2025 labs: Environmental panel was only borderline positive to one mold.  It is unlikely that this is  causing your rhinitis symptoms. You may take the nasal spray Ryaltris  as discuss for symptomatic management.   Negative to bananas, tree nuts, peanuts, kiwi, melon, alpha-gal. Continue to avoid foods that are bothersome.  Assessment and Plan: Angela Harrell is a 48 y.o. female with:  Allergic rhinitis due to mold Past history - Chronic allergic rhinitis exacerbated seasonally, symptoms include itchy eyes, nasal congestion, and postnasal drip. Current antihistamines insufficient. Disliked Flonase due to floral scent. 2025 skin testing was inconclusive as you had borderline positive to histamine x 2.  Interim history - 2025 labs only borderline positive to one mold. Ryaltris  helps but using it inconsistently.  See below for environmental control measures. Use over the counter antihistamines such as Zyrtec (cetirizine), Claritin (loratadine), or Xyzal (levocetirizine) daily as needed. May take twice a day during allergy  flares. May switch antihistamines every few months. May take Ryaltris  (olopatadine + mometasone nasal spray combination) 1-2 sprays per nostril twice a day.  Nasal saline spray (i.e., Simply Saline) or nasal saline lavage (i.e., NeilMed) is recommended as needed and prior to medicated nasal sprays. Consider ENT referral again.   Other adverse food reactions, not elsewhere classified Past history - Worsening sensitivities to bananas, beef, tree nuts, cantaloupe, honeydew melon, kiwi. Tolerates cashews. No prior epinephrine  use.  Interim history - 2025 labs negative to bananas, tree nuts, peanuts, kiwi, melon, alpha-gal. Continue to avoid foods that are bothersome - bananas, beef, tree nuts, melons, kiwi.  For mild symptoms you can take over the counter antihistamines (zyrtec 10mg  to 20mg ) and monitor symptoms closely.  If  symptoms worsen or if you have severe symptoms including breathing issues, throat closure, significant swelling, whole body hives, severe diarrhea and vomiting,  lightheadedness then use epinephrine  and seek immediate medical care afterwards. Emergency action plan in place.    Chronic cough Past history - Chronic cough x 1 year. No recent CXR. Differential includes asthma, reflux, PND or post-illness cough. 2025 spirometry today with some improvement in FEV1 post bronchodilator treatment. Clinically feeling slightly improved.  Interim history - Improvement with Ryaltris . Normal spirometry today. Encouraged daily nasal spray.  If no benefit consider getting CXR and doing  2 month trial of daily inhaler.    Mild intermittent reactive airway disease Past history -  exercise induced asthma a a child. 2025 spirometry today with some improvement in FEV1 post bronchodilator treatment. Clinically feeling slightly improved.  May use albuterol  rescue inhaler 2 puffs every 4 to 6 hours as needed for shortness of breath, chest tightness, coughing, and wheezing. May use albuterol  rescue inhaler 2 puffs 5 to 15 minutes prior to strenuous physical activities. Monitor frequency of use - if you need to use it more than twice per week on a consistent basis let us  know.    Heartburn Continue lifestyle and dietary modifications.   Multiple drug allergies Continue to avoid drugs on the drug allergy  list.  Return in about 1 year (around 05/20/2025).  No orders of the defined types were placed in this encounter.  Lab Orders  No laboratory test(s) ordered today    Diagnostics: Spirometry:  Tracings reviewed. Her effort: Good reproducible efforts. FVC: 3.28L FEV1: 2.61L, 95% predicted FEV1/FVC ratio: 80% Interpretation: Spirometry consistent with normal pattern.  Please see scanned spirometry results for details.  Results discussed with patient/family.   Medication List:  Current Outpatient Medications  Medication Sig Dispense Refill   albuterol  (PROVENTIL  HFA) 108 (90 Base) MCG/ACT inhaler Inhale 2 puffs into the lungs every 6 (six) hours as needed for  wheezing or shortness of breath. 1 each 0   Cholecalciferol (VITAMIN D3 PO) Take 2,000 mg by mouth daily.      EPINEPHrine  0.3 mg/0.3 mL IJ SOAJ injection Inject 0.3 mg into the muscle as needed for anaphylaxis. 2 each 1   MAGNESIUM PO Take by mouth.     Olopatadine-Mometasone (RYALTRIS ) 665-25 MCG/ACT SUSP Place 1-2 sprays into the nose in the morning and at bedtime. 29 g 5   Omega-3 Fatty Acids (FISH OIL) 1000 MG CAPS Take by mouth.     Probiotic Product (PROBIOTIC PO) Take 1 tablet by mouth every other day.     PSYLLIUM HUSK PO Take 2 capsules by mouth daily.     rosuvastatin  (CRESTOR ) 10 MG tablet Take 1 tablet (10 mg total) by mouth daily. 90 tablet 3   sertraline  (ZOLOFT ) 25 MG tablet Take 1 tablet (25 mg total) by mouth daily. 30 tablet 0   Triamcinolone  Acetonide (TRIAMCINOLONE  0.1 % CREAM : EUCERIN) CREA Apply 1 application topically 2 (two) times daily as needed for rash or itching. 500 each 1   Turmeric Curcumin 500 MG CAPS Take by mouth.     Current Facility-Administered Medications  Medication Dose Route Frequency Provider Last Rate Last Admin   0.9 %  sodium chloride  infusion  500 mL Intravenous Continuous Cirigliano, Vito V, DO       Allergies: Allergies  Allergen Reactions   Bovine (Beef) Protein-Containing Drug Products Diarrhea    Nausea and vomiting   Prochlorperazine Edisylate Shortness Of Breath    Compazine Tongue  swelling   Prochlorperazine Edisylate Swelling    Swelling of throat   Tree Extract Anaphylaxis    Throat itchy, roof of mouth gets raw   Banana Itching and Nausea And Vomiting    Throat gets itchy   Bovine (Beef) Protein     Diarrhea and stomach cramps    Oxycodone-Acetaminophen      Vomiting   Permethrin Rash    Rash and fever   Statins Itching   Morphine And Codeine Itching   Other     Tree Nuts (can have peanuts and cashews) Pyrethrine (ingredient in lice products)- fever and rash   Oxycodone-Acetaminophen  Nausea And Vomiting   Latex Hives  and Rash    Rash   I reviewed her past medical history, social history, family history, and environmental history and no significant changes have been reported from her previous visit.  Review of Systems  Constitutional:  Negative for appetite change, chills, fever and unexpected weight change.  HENT:  Positive for congestion, postnasal drip and rhinorrhea.   Respiratory:  Positive for cough. Negative for chest tightness, shortness of breath and wheezing.   Cardiovascular:  Negative for chest pain.  Gastrointestinal:  Negative for abdominal pain.  Genitourinary:  Negative for difficulty urinating.  Skin:  Negative for rash.  Neurological:  Negative for headaches.    Objective: BP 108/80 (BP Location: Left Arm, Patient Position: Sitting)   Pulse 79   Temp 98.5 F (36.9 C) (Temporal)   Resp 16   Ht 5' 3 (1.6 m)   Wt 194 lb (88 kg)   SpO2 98%   BMI 34.37 kg/m  Body mass index is 34.37 kg/m. Physical Exam Vitals and nursing note reviewed.  Constitutional:      Appearance: Normal appearance. She is well-developed.  HENT:     Head: Normocephalic and atraumatic.     Right Ear: Tympanic membrane and external ear normal.     Left Ear: Tympanic membrane and external ear normal.     Nose: Congestion (on left side) present.     Mouth/Throat:     Mouth: Mucous membranes are moist.     Pharynx: Oropharynx is clear.  Eyes:     Conjunctiva/sclera: Conjunctivae normal.  Cardiovascular:     Rate and Rhythm: Normal rate and regular rhythm.     Heart sounds: Normal heart sounds. No murmur heard.    No friction rub. No gallop.  Pulmonary:     Effort: Pulmonary effort is normal.     Breath sounds: Normal breath sounds. No wheezing, rhonchi or rales.  Musculoskeletal:     Cervical back: Neck supple.  Skin:    General: Skin is warm.     Findings: No rash.  Neurological:     Mental Status: She is alert and oriented to person, place, and time.  Psychiatric:        Behavior: Behavior  normal.    Previous notes and tests were reviewed. The plan was reviewed with the patient/family, and all questions/concerned were addressed.  It was my pleasure to see Angela Harrell today and participate in her care. Please feel free to contact me with any questions or concerns.  Sincerely,  Orlan Cramp, DO Allergy  & Immunology  [facility]  [place] office: [phone] [facility] office: [phone]

## 2024-05-20 NOTE — Patient Instructions (Addendum)
 Rhinitis  2025 labs only borderline positive to one mold.  See below for environmental control measures. Use over the counter antihistamines such as Zyrtec (cetirizine), Claritin (loratadine), or Xyzal (levocetirizine) daily as needed. May take twice a day during allergy  flares. May switch antihistamines every few months. May take Ryaltris  (olopatadine + mometasone nasal spray combination) 1-2 sprays per nostril twice a day.  Nasal saline spray (i.e., Simply Saline) or nasal saline lavage (i.e., NeilMed) is recommended as needed and prior to medicated nasal sprays. Consider ENT referral again.   Foods 2025 labs negative to bananas, tree nuts, peanuts, kiwi, melon, alpha-gal. Continue to avoid foods that are bothersome - bananas, beef, tree nuts, melons, kiwi.  Most likely has some sensitivity to it due to your clinical history. For mild symptoms you can take over the counter antihistamines (zyrtec 10mg  to 20mg ) and monitor symptoms closely.  If symptoms worsen or if you have severe symptoms including breathing issues, throat closure, significant swelling, whole body hives, severe diarrhea and vomiting, lightheadedness then use epinephrine  and seek immediate medical care afterwards. Emergency action plan in place.   Breathing Normal breathing test.  May use albuterol  rescue inhaler 2 puffs every 4 to 6 hours as needed for shortness of breath, chest tightness, coughing, and wheezing. May use albuterol  rescue inhaler 2 puffs 5 to 15 minutes prior to strenuous physical activities. Monitor frequency of use - if you need to use it more than twice per week on a consistent basis let us  know.   Drug allergies Continue to avoid drugs on the drug allergy  list.   Skin  Keep track of rashes and take pictures. Continue proper skin care.   Heartburn Continue lifestyle and dietary modifications.  Follow up in 12 months or sooner if needed.    Buffered Isotonic Saline Irrigations:  Goal: When you  irrigate with the isotonic saline (salt water) it washes mucous and other debris from your nose that could be contributing to your nasal symptoms.   Recipe: Obtain 1 quart jar that is clean Fill with clean (bottled, boiled or distilled) water Add 1-2 heaping teaspoons of salt without iodine If the solution with 2 teaspoons of salt is too strong, adjust the amount down until better tolerated Add 1 teaspoon of [employer] baking soda (pure bicarbonate) Mix ingredients together and store at room temperature and discard after 1 week * Alternatively you can buy pre made salt packets for the NeilMed bottle or there          are other over the counter brands available  Instructions: Warm  cup of the solution in the microwave if desired but be careful not to overheat as this will burn the inside of your nose Stand over a sink (or do it while you shower) and squirt the solution into one side of your nose aiming towards the back of your head Sometimes saying "coca cola" while irrigating can be helpful to prevent fluid from going down your throat  The solution will travel to the back of your nose and then come out the other side Perform this again on the other side Try to do this twice a day If you are using a nasal spray in addition to the irrigation, irrigate first and then use the topical nasal spray otherwise you will wash the nasal spray out of your nose   Mold Control Mold and fungi can grow on a variety of surfaces provided certain temperature and moisture conditions exist.  Outdoor molds grow  on plants, decaying vegetation and soil. The major outdoor mold, Alternaria and Cladosporium, are found in very high numbers during hot and dry conditions. Generally, a late summer - fall peak is seen for common outdoor fungal spores. Rain will temporarily lower outdoor mold spore count, but counts rise rapidly when the rainy period ends. The most important indoor molds are Aspergillus and Penicillium.  Dark, humid and poorly ventilated basements are ideal sites for mold growth. The next most common sites of mold growth are the bathroom and the kitchen. Outdoor (Seasonal) Mold Control Use air conditioning and keep windows closed. Avoid exposure to decaying vegetation. Avoid leaf raking. Avoid grain handling. Consider wearing a face mask if working in moldy areas.  Indoor (Perennial) Mold Control  Maintain humidity below 50%. Get rid of mold growth on hard surfaces with water, detergent and, if necessary, 5% bleach (do not mix with other cleaners). Then dry the area completely. If mold covers an area more than 10 square feet, consider hiring an indoor environmental professional. For clothing, washing with soap and water is best. If moldy items cannot be cleaned and dried, throw them away. Remove sources e.g. contaminated carpets. Repair and seal leaking roofs or pipes. Using dehumidifiers in damp basements may be helpful, but empty the water and clean units regularly to prevent mildew from forming. All rooms, especially basements, bathrooms and kitchens, require ventilation and cleaning to deter mold and mildew growth. Avoid carpeting on concrete or damp floors, and storing items in damp areas.   Skin care recommendations  Bath time: Always use lukewarm water. AVOID very hot or cold water. Keep bathing time to 5-10 minutes. Do NOT use bubble bath. Use a mild soap and use just enough to wash the dirty areas. Do NOT scrub skin vigorously.  After bathing, pat dry your skin with a towel. Do NOT rub or scrub the skin.  Moisturizers and prescriptions:  ALWAYS apply moisturizers immediately after bathing (within 3 minutes). This helps to lock-in moisture. Use the moisturizer several times a day over the whole body. Good summer moisturizers include: Aveeno, CeraVe, Cetaphil. Good winter moisturizers include: Aquaphor, Vaseline, Cerave, Cetaphil, Eucerin, Vanicream. When using moisturizers along  with medications, the moisturizer should be applied about one hour after applying the medication to prevent diluting effect of the medication or moisturize around where you applied the medications. When not using medications, the moisturizer can be continued twice daily as maintenance.  Laundry and clothing: Avoid laundry products with added color or perfumes. Use unscented hypo-allergenic laundry products such as Tide free, Cheer free & gentle, and All free and clear.  If the skin still seems dry or sensitive, you can try double-rinsing the clothes. Avoid tight or scratchy clothing such as wool. Do not use fabric softeners or dyer sheets.

## 2024-05-26 ENCOUNTER — Ambulatory Visit: Payer: BC Managed Care – PPO | Admitting: Psychology

## 2024-06-09 ENCOUNTER — Ambulatory Visit: Payer: BC Managed Care – PPO | Admitting: Psychology

## 2024-06-09 DIAGNOSIS — F411 Generalized anxiety disorder: Secondary | ICD-10-CM | POA: Diagnosis not present

## 2024-06-09 DIAGNOSIS — F3281 Premenstrual dysphoric disorder: Secondary | ICD-10-CM

## 2024-06-09 NOTE — Progress Notes (Unsigned)
 [facility] Counselor/Therapist Progress Note  Patient ID: Angela Harrell, MRN: [id number]   Date: 06/09/24  Time Spent: 10:03 am - 11:02 am :  59 Minutes  Treatment Type: Individual Therapy.  Reported Symptoms: depression and anxiety.   Mental Status Exam:  Appearance:  Casual     Behavior: Appropriate  Motor: Normal  Speech/Language:  Clear and Coherent and Normal Rate  Affect: Congruent  Mood: dysthymic  Thought process: normal  Thought content:   WNL  Sensory/Perceptual disturbances:   WNL  Orientation: oriented to person, place, time/date, and situation  Attention: Good  Concentration: Good  Memory: WNL  Fund of knowledge:  Good  Insight:   Good  Judgment:  Good  Impulse Control: Good   Risk Assessment: Danger to Self:  No Self-injurious Behavior: No Danger to Others: No Duty to Warn:no Physical Aggression / Violence:No  Access to Firearms a concern: No  Gang Involvement:No   Subjective:   Angela Harrell participated from home, via video and consented to treatment. Therapist participated from home office. She discussed the events of the past week. Angela Harrell noted the stress of the political atmosphere and the effect of this on her. She noted the stressors for her children as well. We worked on processing this during the session. She noted her continued effort to maintain boundaries with her family in regards to managing the emotional labor of the household. She noted this being difficult with her husband who is often forgetful and noted her intent to give him reminders. We worked on exploring this and processing if this aligns with her ultimate goals. We worked on identifying other approaches that might align with her goals and identified alternative ways to address concerns that would place the responsibility on her husband. Angela Harrell reflected on her relationship with her parents, particularly her father and stated maybe you're bipolar like my bother?. She noted  her father often exhibiting a vacillating mood and described it as anxious angry energy. She noted that his personality, in many ways, has intensified. She reflected on her relationships, or lack there of, with her brothers. She noted I dont know if it's healthy or not but I let my brothers go out of my life  & even when I put energy in my brothers, I dont get anything positive out of it. She noted this feeling harsh and mean. We worked on exploring this during the session and Angela Harrell provided additional history regarding her brothers. She noted that focusing on realizing that I deserve healthy people in my life and I didn't feel like I deserved them. She noted improvement in this area and noted her effort to engage with those who are supportive and invest in their relationship. Therapist validated Angela Harrell's feelings and experience during the session. Therapist encouraged Angela Harrell to engage in self-care. Angela Harrell continues to benefit from counseling and has a follow-up for continued treatment.   Diagnosis:  Generalized anxiety disorder  PMDD (premenstrual dysphoric disorder)  Psychiatric Treatment: Yes , Angela Sayers, NP.    Treatment Plan:  Client Abilities/Strengths Angela Harrell is intelligent, self-aware, and motivated for change.   Support System: Family and friends.   Client Treatment Preferences Outpatient therapy and continued psychiatric treatment.   Client Statement of Needs Angela Harrell would like to engage in activities that she previously avoided due to anxiety, building and maintaining a daily routine, managing distress, managing overall symptoms, processing past events, building a larger support system, maintain sessions with psychiatric provider, being more assertive, verbalizing needs, and setting boundaries  with others, managing social pressure (owing explanations or answering requests immediately).    Treatment Level Weekly  Symptoms  Depression: feeling down, poor sleep,  lethargy, fluctuating appetite, feeling bad about self.   (Status: maintained) Anxiety:  Feeling anxious, difficulty managing worry, worrying about different things, trouble relaxing, irritability, and feeling afraid something awful might happen.   (Status: maintained)  Goals:   Angela Harrell experiences symptoms of depression and anxiety.   Treatment plan signed and available on s-drive:   No, pending signature page.  Angela Harrell was sent the treatment plan signature form on 11/12/23.   Target Date: 11/11/24 Frequency: Weekly  Progress: 0 Modality: individual    Therapist will provide referrals for additional resources as appropriate.  Therapist will provide psycho-education regarding Angela Harrell's diagnosis and corresponding treatment approaches and interventions. Angela Harrell will support the patient's ability to achieve the goals identified. will employ CBT, BA, Problem-solving, Solution Focused, Mindfulness,  coping skills, & other evidenced-based practices will be used to promote progress towards healthy functioning to help manage decrease symptoms associated with her diagnosis.   Reduce overall level, frequency, and intensity of the feelings of depression, anxiety.  evidenced by decreased overall symptoms from 6 to 7 days/week to 0 to 1 days/week per client report for at least 3 consecutive months. Verbally express understanding of the relationship between feelings of depression, anxiety and their impact on thinking patterns and behaviors. Verbalize an understanding of the role that distorted thinking plays in creating fears, excessive worry, and ruminations.    [name] participated in the creation of the treatment plan)   Angela Harrell

## 2024-06-23 ENCOUNTER — Ambulatory Visit: Payer: BC Managed Care – PPO | Admitting: Psychology

## 2024-07-07 ENCOUNTER — Ambulatory Visit: Admitting: Psychology

## 2024-07-07 DIAGNOSIS — F411 Generalized anxiety disorder: Secondary | ICD-10-CM | POA: Diagnosis not present

## 2024-07-07 DIAGNOSIS — F3281 Premenstrual dysphoric disorder: Secondary | ICD-10-CM

## 2024-07-07 NOTE — Progress Notes (Signed)
 [facility] Counselor/Therapist Progress Note  Patient ID: Quentin Strebel, MRN: [id number]   Date: 07/07/2024  Time Spent: 10:06 am - 11:04 am :  58 Minutes  Treatment Type: Individual Therapy.  Reported Symptoms: depression and anxiety.   Mental Status Exam:  Appearance:  Casual     Behavior: Appropriate  Motor: Normal  Speech/Language:  Clear and Coherent and Normal Rate  Affect: Congruent  Mood: dysthymic  Thought process: normal  Thought content:   WNL  Sensory/Perceptual disturbances:   WNL  Orientation: oriented to person, place, time/date, and situation  Attention: Good  Concentration: Good  Memory: WNL  Fund of knowledge:  Good  Insight:   Good  Judgment:  Good  Impulse Control: Good   Risk Assessment: Danger to Self:  No Self-injurious Behavior: No Danger to Others: No Duty to Warn:no Physical Aggression / Violence:No  Access to Firearms a concern: No  Gang Involvement:No   Subjective:   [name] participated from home, via video and consented to treatment. Therapist participated from home office. She discussed the events of the past week. She noted her grandmother's unexpected hospitalization and was diagnosed with a stroke. She noted having to monitor her dad, who has dementia, during that time. She noted this being quite stressful as a result of her father's poor functioning and poor frustration tolerance. We worked processing this during the session. She noted her recent frustration regarding her husband's lack of notice regarding a personal change that affects the family as a whole, his diet, and noted a lack of flexibility in this area, specifically her request to begin this after Christmas. We worked on exploring this during the session. She noted this feeling like a disrespect of your time with both this issue and feeling unheard, having to provide emotional labor, and not feeling like her partner is aware of her needs. She noted feeling  disconnected and that we usually get back together. She noted having to communicate with her husband how his behavior can be reminder of her childhood. She noted we have these conversations but nothing sticks.  We worked on processing these stressors and the effect of said stressors on her mood and relationship, as a whole. She noted her husband expressing interest in therapy and therapist encouraged this during the session. Therapist previously provided [place] with resources for marital counseling and resent said resources today. Therapist validated Rubee's feelings and experience. Therapist encouraged continued self-care and provided supportive therapy. A follow-up was scheduled for continued treatment, which Arwyn benefits from.   Diagnosis:  Generalized anxiety disorder  PMDD (premenstrual dysphoric disorder)  Psychiatric Treatment: Yes , [name], NP.    Treatment Plan:  Client Abilities/Strengths Citlalli is intelligent, self-aware, and motivated for change.   Support System: Family and friends.   Client Treatment Preferences Outpatient therapy and continued psychiatric treatment.   Client Statement of Needs Afreen would like to engage in activities that she previously avoided due to anxiety, building and maintaining a daily routine, managing distress, managing overall symptoms, processing past events, building a larger support system, maintain sessions with psychiatric provider, being more assertive, verbalizing needs, and setting boundaries with others, managing social pressure (owing explanations or answering requests immediately).    Treatment Level Weekly  Symptoms  Depression: feeling down, poor sleep, lethargy, fluctuating appetite, feeling bad about self.   (Status: declined) Anxiety:  Feeling anxious, difficulty managing worry, worrying about different things, trouble relaxing, irritability, and feeling afraid something awful might happen.   (Status:  maintained)  Goals:   Belkys experiences symptoms of depression and anxiety.   Treatment plan signed and available on s-drive:   No, pending signature page.  Beverlyn Mcginness was sent the treatment plan signature form on 11/12/23.   Target Date: 11/11/24 Frequency: Weekly  Progress: 10% Modality: individual    Therapist will provide referrals for additional resources as appropriate.  Therapist will provide psycho-education regarding Monifa's diagnosis and corresponding treatment approaches and interventions. [name], [name] will support the patient's ability to achieve the goals identified. will employ CBT, BA, Problem-solving, Solution Focused, Mindfulness,  coping skills, & other evidenced-based practices will be used to promote progress towards healthy functioning to help manage decrease symptoms associated with her diagnosis.   Reduce overall level, frequency, and intensity of the feelings of depression, anxiety.  evidenced by decreased overall symptoms from 6 to 7 days/week to 0 to 1 days/week per client report for at least 3 consecutive months. Verbally express understanding of the relationship between feelings of depression, anxiety and their impact on thinking patterns and behaviors. Verbalize an understanding of the role that distorted thinking plays in creating fears, excessive worry, and ruminations.    [name] participated in the creation of the treatment plan)   [name], [name]

## 2024-07-08 ENCOUNTER — Other Ambulatory Visit: Payer: Self-pay | Admitting: Adult Health

## 2024-07-08 DIAGNOSIS — F411 Generalized anxiety disorder: Secondary | ICD-10-CM

## 2024-07-08 DIAGNOSIS — F3281 Premenstrual dysphoric disorder: Secondary | ICD-10-CM

## 2024-07-08 NOTE — Telephone Encounter (Signed)
 Good Morning!  Please call pt to schedule her next appt. Was supposed to return in August.  Thank you

## 2024-07-13 NOTE — Telephone Encounter (Signed)
 LVM to schedule f/u

## 2024-07-16 NOTE — Telephone Encounter (Signed)
 Pt seems to thinks she can continue taking med without having a visit.  I told her it's almost a year since she as last seen.  She said someone told her that because she was on a low dose she got a 90 day refill of Setraline in Sept, but she's took her last pill and would like a refill.  She wants to know if she should continue to get it from [facility] or her regular doctor.  Pls  talk to [facility] and then call her to discuss.  She sounds confused by different conversations she's had with people here as to whether she needs to be seen again.

## 2024-07-20 ENCOUNTER — Encounter: Payer: Self-pay | Admitting: Family Medicine

## 2024-07-21 ENCOUNTER — Ambulatory Visit: Admitting: Psychology

## 2024-07-21 DIAGNOSIS — F411 Generalized anxiety disorder: Secondary | ICD-10-CM | POA: Diagnosis not present

## 2024-07-21 DIAGNOSIS — F3281 Premenstrual dysphoric disorder: Secondary | ICD-10-CM | POA: Diagnosis not present

## 2024-07-21 NOTE — Progress Notes (Signed)
 [facility] Counselor/Therapist Progress Note  Patient ID: Angela Harrell, MRN: [id number]   Date: 07/21/24  Time Spent: 10:06 am - 11:02 am :  56 Minutes  Treatment Type: Individual Therapy.  Reported Symptoms: depression and anxiety.   Mental Status Exam:  Appearance:  Casual     Behavior: Appropriate  Motor: Normal  Speech/Language:  Clear and Coherent and Normal Rate  Affect: Congruent  Mood: dysthymic  Thought process: normal  Thought content:   WNL  Sensory/Perceptual disturbances:   WNL  Orientation: oriented to person, place, time/date, and situation  Attention: Good  Concentration: Good  Memory: WNL  Fund of knowledge:  Good  Insight:   Good  Judgment:  Good  Impulse Control: Good   Risk Assessment: Danger to Self:  No Self-injurious Behavior: No Danger to Others: No Duty to Warn:no Physical Aggression / Violence:No  Access to Firearms a concern: No  Gang Involvement:No   Subjective:   Angela Harrell participated from home, via video and consented to treatment. Therapist participated from home office. She discussed the events of the past week. Angela Harrell noted communicating her concerns to her husband. She noted things getting a little better, and noted being frustrated by this noting he can. She noted this confirming her husband's ability to make changes in his behavior but not doing so, in the past. She noted discussing counseling with him and noted her husband not remembering parts of this discussion. She noted frustration that she would have to be the one to call, consult both schedules, and make the appointment. She noted encouraging her husband to considering a psychological evaluation for ADHD, due to various symptoms, despite completing Qbtesting, months ago, which did not highlight an ADHD diagnosis. She noted her husband's response being defensive. She noted often feeling disconnected from her husband Angela Harrell, and noted that it feels like it's me  and the kids. She noted her efforts to communicate needs, provide feedback, and make requests. She noted recent financial stressors due to unexpected bills and noted the effect of this on her mood. She noted half my family is in [place]  and noted her concern regarding their safety. She noted her work to identifying a way to positively affect the current political climate. Angela Harrell noted advocating for myself better, which therapist praised and acknowledged. Therapist praised Angela Harrell for advocating for self, setting boundaries, communicating needs, and identifying areas of healthy control. Therapist encouraged Angela Harrell to continue her work towards self-care and prioritizing self. Therapist provided supportive therapy. Angela Harrell is scheduled for a follow-up, which she continues to benefit from.    Diagnosis:  Generalized anxiety disorder  PMDD (premenstrual dysphoric disorder)  Psychiatric Treatment: Yes , Angela Sayers, NP.    Treatment Plan:  Client Abilities/Strengths Angela Harrell is intelligent, self-aware, and motivated for change.   Support System: Family and friends.   Client Treatment Preferences Outpatient therapy and continued psychiatric treatment.   Client Statement of Needs Angela Harrell would like to engage in activities that she previously avoided due to anxiety, building and maintaining a daily routine, managing distress, managing overall symptoms, processing past events, building a larger support system, maintain sessions with psychiatric provider, being more assertive, verbalizing needs, and setting boundaries with others, managing social pressure (owing explanations or answering requests immediately).    Treatment Level Weekly  Symptoms  Depression: feeling down, poor sleep, lethargy, fluctuating appetite, feeling bad about self.   (Status: maintained) Anxiety:  Feeling anxious, difficulty managing worry, worrying about different things, trouble relaxing, irritability, and feeling afraid  something awful might happen.   (Status: maintained)  Goals:   Angela Harrell experiences symptoms of depression and anxiety.   Treatment plan signed and available on s-drive:   Yes, please see patient chart for sign off.  Angela Harrell was sent the treatment plan signature form on 11/12/23.   Target Date: 11/11/24 Frequency: Weekly  Progress: 10% Modality: individual    Therapist will provide referrals for additional resources as appropriate.  Therapist will provide psycho-education regarding Angela Harrell's diagnosis and corresponding treatment approaches and interventions. Angela Harrell, Angela Harrell will support the patient's ability to achieve the goals identified. will employ CBT, BA, Problem-solving, Solution Focused, Mindfulness,  coping skills, & other evidenced-based practices will be used to promote progress towards healthy functioning to help manage decrease symptoms associated with her diagnosis.   Reduce overall level, frequency, and intensity of the feelings of depression, anxiety.  evidenced by decreased overall symptoms from 6 to 7 days/week to 0 to 1 days/week per client report for at least 3 consecutive months. Verbally express understanding of the relationship between feelings of depression, anxiety and their impact on thinking patterns and behaviors. Verbalize an understanding of the role that distorted thinking plays in creating fears, excessive worry, and ruminations.    Angela Harrell participated in the creation of the treatment plan)   Angela Harrell, Angela Harrell

## 2024-08-04 ENCOUNTER — Ambulatory Visit: Admitting: Psychology

## 2024-08-04 DIAGNOSIS — F3281 Premenstrual dysphoric disorder: Secondary | ICD-10-CM

## 2024-08-04 DIAGNOSIS — F411 Generalized anxiety disorder: Secondary | ICD-10-CM

## 2024-08-04 NOTE — Progress Notes (Unsigned)
 [facility] Counselor/Therapist Progress Note  Patient ID: Angela Harrell, MRN: [id number]   Date: 08/04/24  Time Spent: 10:04 am - 11:02 am :  58 Minutes  Treatment Type: Individual Therapy.  Reported Symptoms: depression and anxiety.   Mental Status Exam:  Appearance:  Casual     Behavior: Appropriate  Motor: Normal  Speech/Language:  Clear and Coherent and Normal Rate  Affect: Congruent  Mood: dysthymic  Thought process: normal  Thought content:   WNL  Sensory/Perceptual disturbances:   WNL  Orientation: oriented to person, place, time/date, and situation  Attention: Good  Concentration: Good  Memory: WNL  Fund of knowledge:  Good  Insight:   Good  Judgment:  Good  Impulse Control: Good   Risk Assessment: Danger to Self:  No Self-injurious Behavior: No Danger to Others: No Duty to Warn:no Physical Aggression / Violence:No  Access to Firearms a concern: No  Gang Involvement:No   Subjective:   Angela Harrell participated from car, via video and consented to treatment. Therapist participated from home office. She discussed the events of the past week. She noted a recent stressor related to her husband waking her up, despite her requests that he does not. We worked on exploring this during the session. She noted that her Obgyn has begun to managing her medication for her PMDD diagnosis. She noted being grateful that this has been addressed due to numerous complications with her psychiatric provider's office. She noted, at times, doubting her feelings regarding her father's behavior & their history and his parenting during her childhood. She noted others affirming her stand and noted their own concerns regarding his behavior and approach. She noted increased financial stressors due to the rising cost of day-to-day living, her children's school loans, and unexpected bills. We worked on exploring this during the session. 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 She noted uncertainty in this area and a need to continue weighing the pros and cons. She noted her effort to have her husband be more involved in budgeting.    Angela Harrell noted communicating her concerns to her husband. She noted things getting a little better, and noted being frustrated by this noting he can. She noted this confirming her husband's ability to make changes in his behavior but not doing so, in the past. She noted discussing counseling with him and noted her husband not remembering parts of this discussion. She noted frustration that she would have to be the one to call, consult both schedules, and make the appointment. She noted encouraging her husband to considering a psychological evaluation for ADHD, due to various symptoms, despite completing Qbtesting, months ago, which did not highlight an ADHD diagnosis. She noted her husband's response being defensive. She noted often feeling disconnected from her husband Angela Harrell, and noted that it feels like it's me and the kids. She noted her efforts to communicate needs, provide feedback, and make requests. She noted recent financial stressors due to unexpected bills and noted the effect of this on her mood. She noted half my family is in [place]  and noted her concern regarding their safety. She noted her work to identifying a way to positively affect the current political climate. Angela Harrell noted advocating for myself better, which therapist praised and acknowledged. Therapist praised Angela Harrell for advocating for self, setting boundaries, communicating needs, and identifying areas of healthy control. Therapist encouraged Angela Harrell to continue her work towards self-care and prioritizing self. Therapist provided supportive therapy. Angela Harrell is  scheduled for a follow-up, which she continues to benefit from.    Diagnosis:  Generalized anxiety disorder  PMDD (premenstrual dysphoric disorder)  Psychiatric Treatment:  Yes , [name], NP.    Treatment Plan:  Client Abilities/Strengths Angela Harrell is intelligent, self-aware, and motivated for change.   Support System: Family and friends.   Client Treatment Preferences Outpatient therapy and continued psychiatric treatment.   Client Statement of Needs Angela Harrell would like to engage in activities that she previously avoided due to anxiety, building and maintaining a daily routine, managing distress, managing overall symptoms, processing past events, building a larger support system, maintain sessions with psychiatric provider, being more assertive, verbalizing needs, and setting boundaries with others, managing social pressure (owing explanations or answering requests immediately).    Treatment Level Weekly  Symptoms  Depression: feeling down, poor sleep, lethargy, fluctuating appetite, feeling bad about self.   (Status: maintained) Anxiety:  Feeling anxious, difficulty managing worry, worrying about different things, trouble relaxing, irritability, and feeling afraid something awful might happen.   (Status: maintained)  Goals:   Angela Harrell experiences symptoms of depression and anxiety.   Treatment plan signed and available on s-drive:   Yes, please see patient chart for sign off.  Angela Harrell was sent the treatment plan signature form on 11/12/23.   Target Date: 11/11/24 Frequency: Weekly  Progress: 10% Modality: individual    Therapist will provide referrals for additional resources as appropriate.  Therapist will provide psycho-education regarding Ski's diagnosis and corresponding treatment approaches and interventions. Angela Mullet, [name] will support the patient's ability to achieve the goals identified. will employ CBT, BA, Problem-solving, Solution Focused, Mindfulness,  coping skills, & other evidenced-based practices will be used to promote progress towards healthy functioning to help manage decrease symptoms associated with her diagnosis.    Reduce overall level, frequency, and intensity of the feelings of depression, anxiety.  evidenced by decreased overall symptoms from 6 to 7 days/week to 0 to 1 days/week per client report for at least 3 consecutive months. Verbally express understanding of the relationship between feelings of depression, anxiety and their impact on thinking patterns and behaviors. Verbalize an understanding of the role that distorted thinking plays in creating fears, excessive worry, and ruminations.    [name] participated in the creation of the treatment plan)   Angela Mullet, [name]

## 2024-08-18 ENCOUNTER — Ambulatory Visit: Admitting: Psychology

## 2024-09-01 ENCOUNTER — Ambulatory Visit: Admitting: Psychology

## 2024-09-15 ENCOUNTER — Ambulatory Visit: Admitting: Psychology

## 2025-05-19 ENCOUNTER — Ambulatory Visit: Admitting: Allergy
